# Patient Record
Sex: Male | Born: 1960 | Race: White | Hispanic: No | Marital: Single | Smoking: Former smoker
Health system: Southern US, Community
[De-identification: ages and names within clinical notes are randomized; demographics above are authoritative.]

## PROBLEM LIST (undated history)

## (undated) DIAGNOSIS — N529 Male erectile dysfunction, unspecified: Secondary | ICD-10-CM

## (undated) DIAGNOSIS — J309 Allergic rhinitis, unspecified: Secondary | ICD-10-CM

## (undated) DIAGNOSIS — R51 Headache: Secondary | ICD-10-CM

## (undated) DIAGNOSIS — R011 Cardiac murmur, unspecified: Secondary | ICD-10-CM

## (undated) DIAGNOSIS — I519 Heart disease, unspecified: Secondary | ICD-10-CM

## (undated) DIAGNOSIS — J449 Chronic obstructive pulmonary disease, unspecified: Secondary | ICD-10-CM

## (undated) DIAGNOSIS — T7840XA Allergy, unspecified, initial encounter: Secondary | ICD-10-CM

## (undated) DIAGNOSIS — L709 Acne, unspecified: Secondary | ICD-10-CM

## (undated) DIAGNOSIS — F319 Bipolar disorder, unspecified: Secondary | ICD-10-CM

## (undated) DIAGNOSIS — R911 Solitary pulmonary nodule: Secondary | ICD-10-CM

## (undated) DIAGNOSIS — R519 Headache, unspecified: Secondary | ICD-10-CM

## (undated) DIAGNOSIS — E119 Type 2 diabetes mellitus without complications: Secondary | ICD-10-CM

## (undated) DIAGNOSIS — E039 Hypothyroidism, unspecified: Secondary | ICD-10-CM

## (undated) DIAGNOSIS — E785 Hyperlipidemia, unspecified: Secondary | ICD-10-CM

## (undated) HISTORY — DX: Chronic obstructive pulmonary disease, unspecified: J44.9

## (undated) HISTORY — DX: Allergic rhinitis, unspecified: J30.9

## (undated) HISTORY — DX: Heart disease, unspecified: I51.9

## (undated) HISTORY — DX: Hypothyroidism, unspecified: E03.9

## (undated) HISTORY — PX: WISDOM TOOTH EXTRACTION: SHX21

## (undated) HISTORY — DX: Acne, unspecified: L70.9

## (undated) HISTORY — DX: Allergy, unspecified, initial encounter: T78.40XA

## (undated) HISTORY — DX: Headache, unspecified: R51.9

## (undated) HISTORY — DX: Male erectile dysfunction, unspecified: N52.9

## (undated) HISTORY — DX: Headache: R51

## (undated) HISTORY — DX: Type 2 diabetes mellitus without complications: E11.9

## (undated) HISTORY — DX: Hyperlipidemia, unspecified: E78.5

## (undated) HISTORY — DX: Cardiac murmur, unspecified: R01.1

## (undated) HISTORY — DX: Solitary pulmonary nodule: R91.1

## (undated) HISTORY — DX: Bipolar disorder, unspecified: F31.9

---

## 2015-07-06 ENCOUNTER — Ambulatory Visit: Payer: Self-pay | Admitting: Primary Care

## 2015-08-06 DIAGNOSIS — E118 Type 2 diabetes mellitus with unspecified complications: Secondary | ICD-10-CM | POA: Diagnosis not present

## 2015-08-06 DIAGNOSIS — L709 Acne, unspecified: Secondary | ICD-10-CM | POA: Diagnosis not present

## 2015-08-06 DIAGNOSIS — E1165 Type 2 diabetes mellitus with hyperglycemia: Secondary | ICD-10-CM | POA: Diagnosis not present

## 2015-08-06 DIAGNOSIS — F319 Bipolar disorder, unspecified: Secondary | ICD-10-CM | POA: Diagnosis not present

## 2015-08-06 DIAGNOSIS — E1169 Type 2 diabetes mellitus with other specified complication: Secondary | ICD-10-CM | POA: Diagnosis not present

## 2015-08-23 DIAGNOSIS — E118 Type 2 diabetes mellitus with unspecified complications: Secondary | ICD-10-CM | POA: Diagnosis not present

## 2015-08-23 DIAGNOSIS — F319 Bipolar disorder, unspecified: Secondary | ICD-10-CM | POA: Diagnosis not present

## 2015-08-23 DIAGNOSIS — E1169 Type 2 diabetes mellitus with other specified complication: Secondary | ICD-10-CM | POA: Diagnosis not present

## 2015-08-23 DIAGNOSIS — L709 Acne, unspecified: Secondary | ICD-10-CM | POA: Diagnosis not present

## 2015-08-27 DIAGNOSIS — H524 Presbyopia: Secondary | ICD-10-CM | POA: Diagnosis not present

## 2015-08-27 DIAGNOSIS — E1165 Type 2 diabetes mellitus with hyperglycemia: Secondary | ICD-10-CM | POA: Diagnosis not present

## 2015-08-27 DIAGNOSIS — E118 Type 2 diabetes mellitus with unspecified complications: Secondary | ICD-10-CM | POA: Diagnosis not present

## 2015-08-27 DIAGNOSIS — E119 Type 2 diabetes mellitus without complications: Secondary | ICD-10-CM | POA: Diagnosis not present

## 2015-08-27 DIAGNOSIS — H5213 Myopia, bilateral: Secondary | ICD-10-CM | POA: Diagnosis not present

## 2015-08-27 DIAGNOSIS — H52203 Unspecified astigmatism, bilateral: Secondary | ICD-10-CM | POA: Diagnosis not present

## 2015-09-08 DIAGNOSIS — F250 Schizoaffective disorder, bipolar type: Secondary | ICD-10-CM | POA: Diagnosis not present

## 2015-09-23 DIAGNOSIS — Z23 Encounter for immunization: Secondary | ICD-10-CM | POA: Diagnosis not present

## 2015-09-23 DIAGNOSIS — E1169 Type 2 diabetes mellitus with other specified complication: Secondary | ICD-10-CM | POA: Diagnosis not present

## 2015-09-23 DIAGNOSIS — F319 Bipolar disorder, unspecified: Secondary | ICD-10-CM | POA: Diagnosis not present

## 2015-09-23 DIAGNOSIS — E118 Type 2 diabetes mellitus with unspecified complications: Secondary | ICD-10-CM | POA: Diagnosis not present

## 2015-09-24 DIAGNOSIS — Z23 Encounter for immunization: Secondary | ICD-10-CM | POA: Diagnosis not present

## 2015-10-01 DIAGNOSIS — L03211 Cellulitis of face: Secondary | ICD-10-CM | POA: Diagnosis not present

## 2015-10-06 DIAGNOSIS — E1165 Type 2 diabetes mellitus with hyperglycemia: Secondary | ICD-10-CM | POA: Diagnosis not present

## 2015-10-06 DIAGNOSIS — E118 Type 2 diabetes mellitus with unspecified complications: Secondary | ICD-10-CM | POA: Diagnosis not present

## 2015-10-06 DIAGNOSIS — F319 Bipolar disorder, unspecified: Secondary | ICD-10-CM | POA: Diagnosis not present

## 2015-10-06 DIAGNOSIS — E1169 Type 2 diabetes mellitus with other specified complication: Secondary | ICD-10-CM | POA: Diagnosis not present

## 2015-10-06 DIAGNOSIS — Z0000 Encounter for general adult medical examination without abnormal findings: Secondary | ICD-10-CM | POA: Diagnosis not present

## 2015-10-06 DIAGNOSIS — E785 Hyperlipidemia, unspecified: Secondary | ICD-10-CM | POA: Diagnosis not present

## 2015-12-15 ENCOUNTER — Encounter: Payer: Self-pay | Admitting: Primary Care

## 2015-12-15 ENCOUNTER — Ambulatory Visit (INDEPENDENT_AMBULATORY_CARE_PROVIDER_SITE_OTHER): Payer: PPO | Admitting: Primary Care

## 2015-12-15 VITALS — BP 126/76 | HR 92 | Temp 98.6°F | Ht 76.0 in | Wt 228.4 lb

## 2015-12-15 DIAGNOSIS — E785 Hyperlipidemia, unspecified: Secondary | ICD-10-CM | POA: Diagnosis not present

## 2015-12-15 DIAGNOSIS — N529 Male erectile dysfunction, unspecified: Secondary | ICD-10-CM

## 2015-12-15 DIAGNOSIS — R5383 Other fatigue: Secondary | ICD-10-CM

## 2015-12-15 DIAGNOSIS — L700 Acne vulgaris: Secondary | ICD-10-CM | POA: Insufficient documentation

## 2015-12-15 DIAGNOSIS — F319 Bipolar disorder, unspecified: Secondary | ICD-10-CM | POA: Insufficient documentation

## 2015-12-15 DIAGNOSIS — F3174 Bipolar disorder, in full remission, most recent episode manic: Secondary | ICD-10-CM

## 2015-12-15 DIAGNOSIS — E119 Type 2 diabetes mellitus without complications: Secondary | ICD-10-CM | POA: Diagnosis not present

## 2015-12-15 DIAGNOSIS — L709 Acne, unspecified: Secondary | ICD-10-CM

## 2015-12-15 LAB — TSH: TSH: 8.59 u[IU]/mL — AB (ref 0.35–4.50)

## 2015-12-15 MED ORDER — METFORMIN HCL 1000 MG PO TABS: 1000.0000 mg | ORAL_TABLET | Freq: Two times a day (BID) | ORAL | Status: DC

## 2015-12-15 MED ORDER — SILDENAFIL CITRATE 100 MG PO TABS: 100.0000 mg | ORAL_TABLET | Freq: Every day | ORAL | Status: DC | PRN

## 2015-12-15 MED ORDER — FENOFIBRATE 150 MG PO CAPS: 150.0000 mg | ORAL_CAPSULE | Freq: Every day | ORAL | Status: DC

## 2015-12-15 MED ORDER — TRETINOIN 0.05 % EX CREA: TOPICAL_CREAM | CUTANEOUS | Status: DC | PRN

## 2015-12-15 MED ORDER — SIMVASTATIN 40 MG PO TABS: 40.0000 mg | ORAL_TABLET | Freq: Every day | ORAL | Status: DC

## 2015-12-15 MED ORDER — GLIMEPIRIDE 4 MG PO TABS: 4.0000 mg | ORAL_TABLET | Freq: Every day | ORAL | Status: DC

## 2015-12-15 NOTE — Progress Notes (Signed)
Subjective:    Patient ID: Willie Hughes, male    DOB: 06/06/1961, 55 y.o.   MRN: [id number]  HPI  Mr. Willie Hughes is a 55 year old male who presents today to establish care and discuss the problems mentioned below. Will obtain old records. His last physical was several months ago.  1) Type 2 Diabetes: Diagnosed 17 years ago. His last A1C was "high", but does not remember the exact number. Care everywhere shows an A1C of 12.5 from October 06, 2015. Currently managed on Metformin 1000 mg BID and Glimepiride 4 mg once daily which he resumed several weeks ago. Prior to 2 weeks ago he was taking his medication sporatically. He checks his blood sugars at home and gets readings of around low 200's which he believes is good. He experiences numbness/tingling to his feet.   Over the past 1 year he's felt fatigued, difficulty concentrating, depression. He would like his thyroid checked today as he is concerned he may have hypothyroidism. No family history. He is managed on Lithium.   2) Hyperlipidemia: Currently managed on Febofibrate 150 mg and Simvastatin 40 mg. His last lipid panel several months ago and he doesn't remember his levels. Care everywhere shows LDL of 93, TC of 179, Triglycerides at 537.  He endorses a fair diet: Breakfast: Orange juice, coffee with sugar, fruit, frozen biscuit, doughnuts Lunch: Sandwich, fruit, chips, chili Dinner: Frozen dinner Snacks: Fruit Desserts: Occasionally Beverages: Juice, coffee, diet and regular soda, some water  Exercise: He does not currently exercise.  3) Bipolar Disorder with Mania: Long term history of mental disorder since childhood, diagnosed with Bipolar Disorder in 2009. Currently managed on Abilify and Lithium. He has an appointment with a [facility] psychiatrist in early July. He does feel well managed at his current dose of medications. Denies SI/HI.  4) Erectile Dysfunction: Currently managed on Viagra 100 mg for the past 3 years. He will take 1 tablet  every 3 days. He is requesting a printed prescription refill today.  Review of Systems  Constitutional: Positive for fatigue.  Eyes: Negative for visual disturbance.  Respiratory: Negative for shortness of breath.   Cardiovascular: Negative for chest pain.  Endocrine: Negative for polydipsia.  Neurological: Positive for numbness.  Psychiatric/Behavioral:       See HPI       Past Medical History  Diagnosis Date  . Type 2 diabetes mellitus (HCC)   . Hyperlipidemia   . Allergic rhinitis   . Acne   . Bipolar disorder (HCC)   . Erectile dysfunction      Social History   Social History  . Marital Status: Single    Spouse Name: N/A  . Number of Children: N/A  . Years of Education: N/A   Occupational History  . Not on file.   Social History Main Topics  . Smoking status: Former Games developermoker  . Smokeless tobacco: Not on file     Comment: Viper  . Alcohol Use: No  . Drug Use: No  . Sexual Activity: Not on file   Other Topics Concern  . Not on file   Social History Narrative   Single.   None.   Disabled.    Enjoys watching TV, playing on the Internet.    No past surgical history on file.  Family History  Problem Relation Age of Onset  . Heart attack Father   . Stroke Father   . Stroke Mother   . Diabetes Mother   . Depression Mother   .  Depression Sister     No Known Allergies  No current outpatient prescriptions on file prior to visit.   No current facility-administered medications on file prior to visit.    BP 126/76 mmHg  Pulse 92  Temp(Src) 98.6 F (37 C) (Oral)  Ht  (1.93 m)  Wt 228 lb 6.4 oz (103.602 kg)  BMI 27.81 kg/m2  SpO2 94%    Objective:   Physical Exam  Constitutional: He is oriented to person, place, and time. He appears well-nourished.  Neck: Neck supple.  Cardiovascular: Normal rate and regular rhythm.   Pulmonary/Chest: Effort normal and breath sounds normal. He has no wheezes. He has no rales.  Neurological: He is alert  and oriented to person, place, and time.  Skin: Skin is warm and dry.  Psychiatric: He has a normal mood and affect.          Assessment & Plan:

## 2015-12-15 NOTE — Assessment & Plan Note (Signed)
Managed on Retin-A and OTC Lotrisone. Refill provided today.

## 2015-12-15 NOTE — Assessment & Plan Note (Signed)
Recently lipid panel with uncontrolled triglycerides from March 2017. Discussed importance of medication compliance. Managed on simvastatin, fenofibrate, and aspirin. Will recheck in 3 months

## 2015-12-15 NOTE — Assessment & Plan Note (Signed)
Managed on abilify and lithium per psychiatry. He is establishing with [facility] in July. Denies SI/HI.

## 2015-12-15 NOTE — Progress Notes (Signed)
Pre visit review using our clinic review tool, if applicable. No additional management support is needed unless otherwise documented below in the visit note. 

## 2015-12-15 NOTE — Assessment & Plan Note (Signed)
Managed on Viagra 100 mg PRN for the past 3 years. Refill provided today.

## 2015-12-15 NOTE — Assessment & Plan Note (Signed)
Uncontrolled, poor diet, does not take meds as prescribed. He recently restarted his medications 2 weeks ago. Long discussion today on the importance of medication compliance and long term effects of non compliance. Discussed proper diabetic diet. Managed on a statin, currently not on an ACE. Urine microalbumin next visit. Will recheck A1C in 3 months.

## 2015-12-15 NOTE — Patient Instructions (Signed)
I've sent refills of your medications to [facility] as requested.  It is important that you improve your diet. Please limit carbohydrates in the form of white bread, rice, pasta, cakes, cookies, sugary drinks, etc. Increase your consumption of fresh fruits and vegetables.  Ensure you are consuming 64 ounces of water daily.  Follow up in 3 months for re-evaluation of diabetes and cholesterol. Make sure you come fasting for at least 4 hours. You may have water and black coffee only.  It was a pleasure to meet you today! Please don't hesitate to call me with any questions. Welcome to [facility]!  Diabetes Mellitus and Food It is important for you to manage your blood sugar (glucose) level. Your blood glucose level can be greatly affected by what you eat. Eating healthier foods in the appropriate amounts throughout the day at about the same time each day will help you control your blood glucose level. It can also help slow or prevent worsening of your diabetes mellitus. Healthy eating may even help you improve the level of your blood pressure and reach or maintain a healthy weight.  General recommendations for healthful eating and cooking habits include:  Eating meals and snacks regularly. Avoid going long periods of time without eating to lose weight.  Eating a diet that consists mainly of plant-based foods, such as fruits, vegetables, nuts, legumes, and whole grains.  Using low-heat cooking methods, such as baking, instead of high-heat cooking methods, such as deep frying. Work with your dietitian to make sure you understand how to use the Nutrition Facts information on food labels. HOW CAN FOOD AFFECT ME? Carbohydrates Carbohydrates affect your blood glucose level more than any other type of food. Your dietitian will help you determine how many carbohydrates to eat at each meal and teach you how to count carbohydrates. Counting carbohydrates is important to keep your blood glucose at a healthy  level, especially if you are using insulin or taking certain medicines for diabetes mellitus. Alcohol Alcohol can cause sudden decreases in blood glucose (hypoglycemia), especially if you use insulin or take certain medicines for diabetes mellitus. Hypoglycemia can be a life-threatening condition. Symptoms of hypoglycemia (sleepiness, dizziness, and disorientation) are similar to symptoms of having too much alcohol.  If your health care provider has given you approval to drink alcohol, do so in moderation and use the following guidelines:  Women should not have more than one drink per day, and men should not have more than two drinks per day. One drink is equal to:  12 oz of beer.  5 oz of wine.  1 oz of hard liquor.  Do not drink on an empty stomach.  Keep yourself hydrated. Have water, diet soda, or unsweetened iced tea.  Regular soda, juice, and other mixers might contain a lot of carbohydrates and should be counted. WHAT FOODS ARE NOT RECOMMENDED? As you make food choices, it is important to remember that all foods are not the same. Some foods have fewer nutrients per serving than other foods, even though they might have the same number of calories or carbohydrates. It is difficult to get your body what it needs when you eat foods with fewer nutrients. Examples of foods that you should avoid that are high in calories and carbohydrates but low in nutrients include:  Trans fats (most processed foods list trans fats on the Nutrition Facts label).  Regular soda.  Juice.  Candy.  Sweets, such as cake, pie, doughnuts, and cookies.  Fried foods. WHAT FOODS  CAN I EAT? Eat nutrient-rich foods, which will nourish your body and keep you healthy. The food you should eat also will depend on several factors, including:  The calories you need.  The medicines you take.  Your weight.  Your blood glucose level.  Your blood pressure level.  Your cholesterol level. You should eat a  variety of foods, including:  Protein.  Lean cuts of meat.  Proteins low in saturated fats, such as fish, egg whites, and beans. Avoid processed meats.  Fruits and vegetables.  Fruits and vegetables that may help control blood glucose levels, such as apples, mangoes, and yams.  Dairy products.  Choose fat-free or low-fat dairy products, such as milk, yogurt, and cheese.  Grains, bread, pasta, and rice.  Choose whole grain products, such as multigrain bread, whole oats, and brown rice. These foods may help control blood pressure.  Fats.  Foods containing healthful fats, such as nuts, avocado, olive oil, canola oil, and fish. DOES EVERYONE WITH DIABETES MELLITUS HAVE THE SAME MEAL PLAN? Because every person with diabetes mellitus is different, there is not one meal plan that works for everyone. It is very important that you meet with a dietitian who will help you create a meal plan that is just right for you.   This information is not intended to replace advice given to you by your health care provider. Make sure you discuss any questions you have with your health care provider.   Document Released: 04/06/2005 Document Revised: 07/31/2014 Document Reviewed: 06/06/2013 Elsevier Interactive Patient Education [employer]

## 2015-12-16 ENCOUNTER — Other Ambulatory Visit: Payer: Self-pay | Admitting: Primary Care

## 2015-12-16 DIAGNOSIS — Z79899 Other long term (current) drug therapy: Secondary | ICD-10-CM

## 2015-12-17 ENCOUNTER — Other Ambulatory Visit (INDEPENDENT_AMBULATORY_CARE_PROVIDER_SITE_OTHER): Payer: PPO

## 2015-12-17 ENCOUNTER — Telehealth: Payer: Self-pay

## 2015-12-17 DIAGNOSIS — Z79899 Other long term (current) drug therapy: Secondary | ICD-10-CM | POA: Diagnosis not present

## 2015-12-17 DIAGNOSIS — E039 Hypothyroidism, unspecified: Secondary | ICD-10-CM

## 2015-12-17 MED ORDER — LEVOTHYROXINE SODIUM 25 MCG PO TABS: ORAL_TABLET | ORAL | Status: DC

## 2015-12-17 NOTE — Telephone Encounter (Signed)
Pt left v/m; pt was in office today to get lithium level and thought a rx for generic thyroid med was going to be sent to [facility]. [facility] and has [facility]. Pt request med to be sent in to pharmacy.Please advise.

## 2015-12-17 NOTE — Telephone Encounter (Signed)
Was going to wait for Lithium level to return. I did send in Levothyroxine 25 mcg tablets to his pharmacy. He is to take 1 tablet by mouth before breakfast with a full glass of water. Please have him scheduled for repeat TSH in 6 weeks. Thanks.

## 2015-12-18 LAB — LITHIUM LEVEL: Lithium Lvl: 0.3 mEq/L — ABNORMAL LOW (ref 0.80–1.40)

## 2015-12-21 NOTE — Telephone Encounter (Signed)
Spoken and notified patient of Kate's comments. Patient verbalized understanding. 

## 2016-01-19 ENCOUNTER — Encounter: Payer: Self-pay | Admitting: Primary Care

## 2016-01-27 ENCOUNTER — Encounter (HOSPITAL_COMMUNITY): Payer: Self-pay | Admitting: Psychiatry

## 2016-01-27 ENCOUNTER — Ambulatory Visit (INDEPENDENT_AMBULATORY_CARE_PROVIDER_SITE_OTHER): Payer: PPO | Admitting: Psychiatry

## 2016-01-27 VITALS — BP 140/90 | HR 111 | Ht 76.0 in | Wt 231.0 lb

## 2016-01-27 DIAGNOSIS — F4010 Social phobia, unspecified: Secondary | ICD-10-CM | POA: Diagnosis not present

## 2016-01-27 DIAGNOSIS — F3160 Bipolar disorder, current episode mixed, unspecified: Secondary | ICD-10-CM

## 2016-01-27 DIAGNOSIS — G4700 Insomnia, unspecified: Secondary | ICD-10-CM | POA: Diagnosis not present

## 2016-01-27 MED ORDER — LITHIUM CARBONATE 300 MG PO CAPS: 1200.0000 mg | ORAL_CAPSULE | Freq: Every day | ORAL | Status: DC
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
## 2016-01-28 ENCOUNTER — Encounter: Payer: Self-pay | Admitting: Primary Care

## 2016-01-28 ENCOUNTER — Ambulatory Visit (INDEPENDENT_AMBULATORY_CARE_PROVIDER_SITE_OTHER): Payer: PPO | Admitting: Primary Care

## 2016-01-28 VITALS — BP 122/76 | HR 96 | Temp 98.0°F | Ht 76.0 in | Wt 228.8 lb

## 2016-01-28 DIAGNOSIS — N529 Male erectile dysfunction, unspecified: Secondary | ICD-10-CM | POA: Diagnosis not present

## 2016-01-28 DIAGNOSIS — E039 Hypothyroidism, unspecified: Secondary | ICD-10-CM | POA: Diagnosis not present

## 2016-01-28 DIAGNOSIS — E032 Hypothyroidism due to medicaments and other exogenous substances: Secondary | ICD-10-CM

## 2016-01-28 DIAGNOSIS — Z79899 Other long term (current) drug therapy: Secondary | ICD-10-CM | POA: Diagnosis not present

## 2016-01-28 DIAGNOSIS — F3174 Bipolar disorder, in full remission, most recent episode manic: Secondary | ICD-10-CM

## 2016-01-28 MED ORDER — SILDENAFIL CITRATE 100 MG PO TABS: 100.0000 mg | ORAL_TABLET | Freq: Every day | ORAL | Status: DC | PRN

## 2016-01-28 NOTE — Progress Notes (Signed)
Pre visit review using our clinic review tool, if applicable. No additional management support is needed unless otherwise documented below in the visit note. 

## 2016-01-28 NOTE — Assessment & Plan Note (Signed)
Now following with psychiatry, enjoyed his recent visit. Will obtain lithium level today along with TSH. Discussed psychiatry will need to monitor lithium levels moving forward.

## 2016-01-28 NOTE — Progress Notes (Signed)
Subjective:    Patient ID: Willie Hughes, male    DOB: 10/23/1960, 55 y.o.   MRN: [id number]  HPI  Mr. Willie Hughes is a 55 year old male who presents today with a chief complaint of expulsion of phlegm. He noticed expelling phlegm after eating solid foods (carrots and nuts) that began 1 month ago. This was intermittent but had noticed for 3-4 days in a row several weeks ago and became worried.   This began after he switched from Allegra to Zyrtec for his seasonal allergies. He's since switched from Zyrtec back to Allegra (Wednesday last week) and has not experienced the expulsion of phlegm since.   He's reporting congestion and post nasal drip that has been present for the past 1 month. Denies nausea, fevers, abdominal pain, diarrhea, choking. He does experience occasional instances where he will have difficulty swallowing, but he does not ever experience fullness to his throat or choking.    Review of Systems  Constitutional: Negative for fever and chills.  HENT: Positive for congestion and postnasal drip. Negative for ear pain.   Respiratory: Negative for cough and shortness of breath.   Gastrointestinal: Negative for nausea, vomiting, abdominal pain and diarrhea.       Past Medical History  Diagnosis Date  . Type 2 diabetes mellitus (HCC)   . Hyperlipidemia   . Allergic rhinitis   . Acne   . Bipolar disorder (HCC)   . Erectile dysfunction   . Hypothyroidism      Social History   Social History  . Marital Status: Single    Spouse Name: N/A  . Number of Children: 0  . Years of Education: N/A   Occupational History  . Not on file.   Social History Main Topics  . Smoking status: Former Smoker    Types: E-cigarettes  . Smokeless tobacco: Never Used     Comment: Viper  . Alcohol Use: 0.0 oz/week    0 Standard drinks or equivalent per week     Comment: a few beers a week  . Drug Use: No     Comment: 1 yr ago used THC  . Sexual Activity: Not on file   Other Topics Concern  .  Not on file   Social History Narrative   Single and has one cat. Living in [place]. Never married. No kids.    Born and raised in [place] by parents. 2 sisters and 1 brother and pt is the youngest. Pt has 2 associates degrees in Sports administratorBiotech and Mechanical design   Disabled for Bipolar disorder.  Last worked 2008    Enjoys watching TV, playing on the Internet.    Past Surgical History  Procedure Laterality Date  . Wisdom tooth extraction      Family History  Problem Relation Age of Onset  . Heart attack Father   . Stroke Father   . Stroke Mother   . Diabetes Mother   . Depression Mother   . Depression Sister   . Bipolar disorder Paternal Aunt     No Known Allergies  Current Outpatient Prescriptions on File Prior to Visit  Medication Sig Dispense Refill  . ARIPiprazole (ABILIFY) 15 MG tablet Take 7.5 mg by mouth daily.     Marland Kitchen. aspirin 325 MG tablet Take 325 mg by mouth daily.    . Calcium Carbonate-Vitamin D3 (CALCIUM 600-D) 600-400 MG-UNIT TABS Take 1 tablet by mouth daily.     . Fenofibrate 150 MG CAPS Take 1 capsule (150 mg total)  by mouth daily. 90 each 1  . fexofenadine (ALLEGRA) 180 MG tablet Take 180 mg by mouth.    Marland Kitchen. glimepiride (AMARYL) 4 MG tablet Take 1 tablet (4 mg total) by mouth daily. 90 tablet 1  . levothyroxine (LEVOTHROID) 25 MCG tablet Take 1 tablet by mouth before breakfast with a full glass of water. 30 tablet 2  . lithium carbonate 300 MG capsule Take 4 capsules (1,200 mg total) by mouth daily. 120 capsule 1  . metFORMIN (GLUCOPHAGE) 1000 MG tablet Take 1 tablet (1,000 mg total) by mouth 2 (two) times daily. 180 tablet 1  . Multiple Vitamins-Minerals (CENTRUM SILVER) tablet Take 1 tablet by mouth daily.     . Omega-3 Fatty Acids (EQL FISH OIL PO) Take by mouth.    Letta Pate. ONETOUCH DELICA LANCETS 33G MISC Inject into the skin. Reported on 01/27/2016    . pseudoephedrine (SUDAFED) 120 MG 12 hr tablet     . simvastatin (ZOCOR) 40 MG tablet Take 1 tablet (40 mg total) by  mouth at bedtime. 90 tablet 1  . tretinoin (RETIN-A) 0.05 % cream Apply topically as needed. 45 g 0   No current facility-administered medications on file prior to visit.    BP 122/76 mmHg  Pulse 96  Temp(Src) 98 F (36.7 C) (Oral)  Ht 6\' 4"  (1.93 m)  Wt 228 lb 12.8 oz (103.783 kg)  BMI 27.86 kg/m2  SpO2 98%    Objective:   Physical Exam  Constitutional: He appears well-nourished.  HENT:  Right Ear: Tympanic membrane and ear canal normal.  Left Ear: Tympanic membrane and ear canal normal.  Nose: No mucosal edema. Right sinus exhibits no maxillary sinus tenderness and no frontal sinus tenderness. Left sinus exhibits no maxillary sinus tenderness and no frontal sinus tenderness.  Mouth/Throat: Oropharynx is clear and moist.  Eyes: Conjunctivae are normal.  Neck: Neck supple.  Cardiovascular: Normal rate and regular rhythm.   Pulmonary/Chest: Effort normal and breath sounds normal. He has no wheezes. He has no rales.  Abdominal: Soft. Bowel sounds are normal. There is no tenderness.  Skin: Skin is warm and dry.  Psychiatric: He has a normal mood and affect.          Assessment & Plan:  Expulsion of Phlegm secondary to Post Nasal Drip:  Intermittent 1 month ago, lasted for 3-4 days.  No symptoms since switching back from Zyrtec to Allegra. Abdominal and respiratory exam unremarkable.  Discussed to continue Allegra for PND and allergic rhinitis. Also to notify me if he develops choking, vomiting, abdominal pain, etc. Return precautions provided.  Morrie Sheldonlark,Rheanne Cortopassi Kendal, NP

## 2016-01-28 NOTE — Patient Instructions (Signed)
Complete lab work prior to leaving today. I will notify you of your results once received.   Continue Allegra. Increase consumption of water, especially when outside in the heat.   Please notify me if you develop vomiting, nausea, fevers, abdominal pain.  It was a pleasure to see you today!

## 2016-01-28 NOTE — Assessment & Plan Note (Signed)
Lithium induced. TSH of 8.5 in late May, due for recheck today. Managed on levothyroxine 25 mcg. Will adjust meds if necessary.

## 2016-01-29 LAB — LITHIUM LEVEL: LITHIUM LVL: 0.8 mmol/L (ref 0.6–1.2)

## 2016-01-29 LAB — TSH: TSH: 8.5 m[IU]/L — AB (ref 0.40–4.50)

## 2016-01-30 ENCOUNTER — Other Ambulatory Visit: Payer: Self-pay | Admitting: Primary Care

## 2016-01-30 DIAGNOSIS — E039 Hypothyroidism, unspecified: Secondary | ICD-10-CM

## 2016-01-30 MED ORDER — LEVOTHYROXINE SODIUM 50 MCG PO TABS: 50.0000 ug | ORAL_TABLET | Freq: Every day | ORAL | Status: DC

## 2016-02-05 ENCOUNTER — Other Ambulatory Visit: Payer: Self-pay | Admitting: Primary Care

## 2016-02-05 ENCOUNTER — Encounter: Payer: Self-pay | Admitting: Primary Care

## 2016-02-05 DIAGNOSIS — L709 Acne, unspecified: Secondary | ICD-10-CM

## 2016-02-07 ENCOUNTER — Encounter: Payer: Self-pay | Admitting: Primary Care

## 2016-02-07 ENCOUNTER — Other Ambulatory Visit: Payer: Self-pay | Admitting: Primary Care

## 2016-02-07 DIAGNOSIS — R5383 Other fatigue: Secondary | ICD-10-CM

## 2016-02-07 DIAGNOSIS — Z840 Family history of diseases of the skin and subcutaneous tissue: Secondary | ICD-10-CM

## 2016-02-07 NOTE — Telephone Encounter (Signed)
Electronically refill request for   tretinoin (RETIN-A) 0.05 % cream   Apply topically as needed.  Dispense: 45 g   Refills: 0     Last prescribed on 12/15/2015. Last seen on 01/28/2016. Follow up on 03/16/2016.

## 2016-02-09 ENCOUNTER — Other Ambulatory Visit (INDEPENDENT_AMBULATORY_CARE_PROVIDER_SITE_OTHER): Payer: PPO

## 2016-02-09 DIAGNOSIS — Z840 Family history of diseases of the skin and subcutaneous tissue: Secondary | ICD-10-CM

## 2016-02-09 DIAGNOSIS — R5383 Other fatigue: Secondary | ICD-10-CM | POA: Diagnosis not present

## 2016-02-09 DIAGNOSIS — Z828 Family history of other disabilities and chronic diseases leading to disablement, not elsewhere classified: Secondary | ICD-10-CM | POA: Diagnosis not present

## 2016-02-10 LAB — CBC
HEMATOCRIT: 44 % (ref 39.0–52.0)
Hemoglobin: 14.8 g/dL (ref 13.0–17.0)
MCHC: 33.5 g/dL (ref 30.0–36.0)
MCV: 88.9 fl (ref 78.0–100.0)
PLATELETS: 324 10*3/uL (ref 150.0–400.0)
RBC: 4.95 Mil/uL (ref 4.22–5.81)
RDW: 12.8 % (ref 11.5–15.5)
WBC: 8.8 10*3/uL (ref 4.0–10.5)

## 2016-02-10 LAB — BASIC METABOLIC PANEL
BUN: 11 mg/dL (ref 6–23)
CALCIUM: 10.3 mg/dL (ref 8.4–10.5)
CO2: 30 meq/L (ref 19–32)
Chloride: 100 mEq/L (ref 96–112)
Creatinine, Ser: 1.08 mg/dL (ref 0.40–1.50)
GFR: 75.53 mL/min (ref >60.00)
GLUCOSE: 315 mg/dL — AB (ref 70–99)
Potassium: 4 mEq/L (ref 3.5–5.1)
SODIUM: 136 meq/L (ref 135–145)

## 2016-02-10 LAB — SEDIMENTATION RATE: Sed Rate: 4 mm/hr (ref 0–20)

## 2016-02-10 LAB — ANA: Anti Nuclear Antibody(ANA): NEGATIVE

## 2016-03-16 ENCOUNTER — Ambulatory Visit (INDEPENDENT_AMBULATORY_CARE_PROVIDER_SITE_OTHER): Payer: PPO | Admitting: Primary Care

## 2016-03-16 VITALS — BP 138/80 | HR 105 | Temp 98.1°F | Ht 76.0 in | Wt 227.0 lb

## 2016-03-16 DIAGNOSIS — E785 Hyperlipidemia, unspecified: Secondary | ICD-10-CM | POA: Diagnosis not present

## 2016-03-16 DIAGNOSIS — E119 Type 2 diabetes mellitus without complications: Secondary | ICD-10-CM | POA: Diagnosis not present

## 2016-03-16 DIAGNOSIS — E032 Hypothyroidism due to medicaments and other exogenous substances: Secondary | ICD-10-CM

## 2016-03-16 DIAGNOSIS — M2550 Pain in unspecified joint: Secondary | ICD-10-CM | POA: Diagnosis not present

## 2016-03-16 LAB — HEMOGLOBIN A1C: Hgb A1c MFr Bld: 11.7 % — ABNORMAL HIGH (ref 4.6–6.5)

## 2016-03-16 LAB — LIPID PANEL
CHOL/HDL RATIO: 5
Cholesterol: 145 mg/dL (ref 0–200)
HDL: 27.7 mg/dL — ABNORMAL LOW (ref >39.00)

## 2016-03-16 LAB — COMPREHENSIVE METABOLIC PANEL
ALT: 28 U/L (ref 0–53)
AST: 23 U/L (ref 0–37)
Albumin: 4.5 g/dL (ref 3.5–5.2)
Alkaline Phosphatase: 89 U/L (ref 39–117)
BUN: 14 mg/dL (ref 6–23)
CO2: 28 meq/L (ref 19–32)
Calcium: 10 mg/dL (ref 8.4–10.5)
Chloride: 97 mEq/L (ref 96–112)
Creatinine, Ser: 1.12 mg/dL (ref 0.40–1.50)
GFR: 72.4 mL/min (ref >60.00)
GLUCOSE: 363 mg/dL — AB (ref 70–99)
POTASSIUM: 4.2 meq/L (ref 3.5–5.1)
SODIUM: 133 meq/L — AB (ref 135–145)
Total Bilirubin: 0.3 mg/dL (ref 0.2–1.2)
Total Protein: 7.2 g/dL (ref 6.0–8.3)

## 2016-03-16 LAB — TSH: TSH: 4.16 u[IU]/mL (ref 0.35–4.50)

## 2016-03-16 LAB — LDL CHOLESTEROL, DIRECT: Direct LDL: 73 mg/dL

## 2016-03-16 NOTE — Patient Instructions (Addendum)
Complete lab work prior to leaving today. I will notify you of your results once received.   It is important that you improve your diet. Please limit carbohydrates in the form of white bread, rice, pasta, cakes, cookies, sugary drinks, etc. Increase your consumption of fresh fruits and vegetables, whole grains, lean protein.  Ensure you are consuming 64 ounces of water daily.  You must take your medications for diabetes everyday as prescribed.  Check your blood sugars in the morning before breakfast and 2 hours after dinner. Record your readings and bring them to your next visit.  You will be contacted regarding your referral to Physical Therapy.  Please let us know if you have not heard back within one week.   Follow up in 3 months for re-evaluation.  It was a pleasure to see you today!  Diabetes Mellitus and Food It is important for you to manage your blood sugar (glucose) level. Your blood glucose level can be greatly affected by what you eat. Eating healthier foods in the appropriate amounts throughout the day at about the same time each day will help you control your blood glucose level. It can also help slow or prevent worsening of your diabetes mellitus. Healthy eating may even help you improve the level of your blood pressure and reach or maintain a healthy weight.  General recommendations for healthful eating and cooking habits include:  Eating meals and snacks regularly. Avoid going long periods of time without eating to lose weight.  Eating a diet that consists mainly of plant-based foods, such as fruits, vegetables, nuts, legumes, and whole grains.  Using low-heat cooking methods, such as baking, instead of high-heat cooking methods, such as deep frying. Work with your dietitian to make sure you understand how to use the Nutrition Facts information on food labels. HOW CAN FOOD AFFECT ME? Carbohydrates Carbohydrates affect your blood glucose level more than any other type of  food. Your dietitian will help you determine how many carbohydrates to eat at each meal and teach you how to count carbohydrates. Counting carbohydrates is important to keep your blood glucose at a healthy level, especially if you are using insulin or taking certain medicines for diabetes mellitus. Alcohol Alcohol can cause sudden decreases in blood glucose (hypoglycemia), especially if you use insulin or take certain medicines for diabetes mellitus. Hypoglycemia can be a life-threatening condition. Symptoms of hypoglycemia (sleepiness, dizziness, and disorientation) are similar to symptoms of having too much alcohol.  If your health care provider has given you approval to drink alcohol, do so in moderation and use the following guidelines:  Women should not have more than one drink per day, and men should not have more than two drinks per day. One drink is equal to:  12 oz of beer.  5 oz of wine.  1 oz of hard liquor.  Do not drink on an empty stomach.  Keep yourself hydrated. Have water, diet soda, or unsweetened iced tea.  Regular soda, juice, and other mixers might contain a lot of carbohydrates and should be counted. WHAT FOODS ARE NOT RECOMMENDED? As you make food choices, it is important to remember that all foods are not the same. Some foods have fewer nutrients per serving than other foods, even though they might have the same number of calories or carbohydrates. It is difficult to get your body what it needs when you eat foods with fewer nutrients. Examples of foods that you should avoid that are high in calories and carbohydrates but  low in nutrients include:  Trans fats (most processed foods list trans fats on the Nutrition Facts label).  Regular soda.  Juice.  Candy.  Sweets, such as cake, pie, doughnuts, and cookies.  Fried foods. WHAT FOODS CAN I EAT? Eat nutrient-rich foods, which will nourish your body and keep you healthy. The food you should eat also will depend  on several factors, including:  The calories you need.  The medicines you take.  Your weight.  Your blood glucose level.  Your blood pressure level.  Your cholesterol level. You should eat a variety of foods, including:  Protein.  Lean cuts of meat.  Proteins low in saturated fats, such as fish, egg whites, and beans. Avoid processed meats.  Fruits and vegetables.  Fruits and vegetables that may help control blood glucose levels, such as apples, mangoes, and yams.  Dairy products.  Choose fat-free or low-fat dairy products, such as milk, yogurt, and cheese.  Grains, bread, pasta, and rice.  Choose whole grain products, such as multigrain bread, whole oats, and brown rice. These foods may help control blood pressure.  Fats.  Foods containing healthful fats, such as nuts, avocado, olive oil, canola oil, and fish. DOES EVERYONE WITH DIABETES MELLITUS HAVE THE SAME MEAL PLAN? Because every person with diabetes mellitus is different, there is not one meal plan that works for everyone. It is very important that you meet with a dietitian who will help you create a meal plan that is just right for you.   This information is not intended to replace advice given to you by your health care provider. Make sure you discuss any questions you have with your health care provider.   Document Released: 04/06/2005 Document Revised: 07/31/2014 Document Reviewed: 06/06/2013 Elsevier Interactive Patient Education [employer]

## 2016-03-16 NOTE — Progress Notes (Signed)
Pre visit review using our clinic review tool, if applicable. No additional management support is needed unless otherwise documented below in the visit note. 

## 2016-03-16 NOTE — Progress Notes (Signed)
Subjective:    Patient ID: Willie Hughes, male    DOB: 12/24/1960, 55 y.o.   MRN: [id number]  HPI  Willie Hughes is a 55 year old who presents today for follow up.  1) Type 2 Diabetes: Currently managed on Metformin 1000 mg BID and Glimepiride 4 mg once daily. History of medication non compliance with last A1C of 12.5 in March 2017. He was strongly encouraged to monitor his blood sugars and remain compliant with medications.   Since his last visit he's continued to remain non compliant with his medications. He has been more compliant to his medications for the past 2-3 weeks as he thinks his blood sugars may be high. He's checking his sugars 2 hours after lunch and dinner, but does not remember the numbers. He's noticed feeling tired.   His diet currently consists of: Breakfast: Juice, fruit, yogurt, cinnamon buns Lunch: Sandwich, chips Dinner: Frozen dinners Snacks: Fruit cups, canned fruit Desserts: Occasionally Beverages: Juice, sugar free tea, water, lemonade  Exercise: He does not currently exercise.  2) Hyperlipidemia: Currently managed on Simvastatin, Fenofibrate, and Aspirin. Endorsed prior lipid panel with uncontrolled triglycerides. He has not been working on improvements in his diet and is not exercising.   3) Hypothyroidism: Diagnosed in 2017. Drug induced as he is managed on Lithium. Currently managed on Levothyroxine 50 mcg. Last TSH of 8.5 in July 2017. He is due for recheck today. Overall he's feeling improved on the 50 mcg dose. He has felt fatigued but is also not controlled with his blood sugars.  4) Arthralgias: Chronic. Bilateral knee pain, left hip pain, and left upper extremity pain for the past 6-8 months. He's been leading a sedentary lifestyle for the past 10 years as he has been unemployed. He has pain with movement, mild inflammation. Denies numbness/tingling to extremities and hip. 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 [facility] and [facility] conduction [facility] in  2015 which showed small partial thickness tears of supraspinatus and infraspinatus tendons, possible labral tear. He is requesting further evaluation through physical therapy as this was helpful in the past.  Review of Systems  Constitutional: Positive for fatigue.  Respiratory: Negative for shortness of breath.   Cardiovascular: Negative for chest pain.  Endocrine: Negative for polyuria.  Musculoskeletal: Positive for arthralgias. Negative for joint swelling.  Neurological: Negative for dizziness, weakness and numbness.  Psychiatric/Behavioral:       Follows with psychiatry, overall feeling well managed.       Past Medical History:  Diagnosis Date  . Acne   . Allergic rhinitis   . Bipolar disorder (HCC)   . Erectile dysfunction   . Hyperlipidemia   . Hypothyroidism   . Type 2 diabetes mellitus (HCC)      Social History   Social History  . Marital status: Single    Spouse name: N/A  . Number of children: 0  . Years of education: N/A   Occupational History  . Not on file.   Social History Main Topics  . Smoking status: Former Smoker    Types: E-cigarettes  . Smokeless tobacco: Never Used     Comment: Viper  . Alcohol use 0.0 oz/week     Comment: a few beers a week  . Drug use: No     Comment: 1 yr ago used THC  . Sexual activity: Not on file   Other Topics Concern  . Not on file   Social History Narrative   Single and has one cat.  Living in [place]. Never married. No kids.    Born and raised in [place] by parents. 2 sisters and 1 brother and pt is the youngest. Pt has 2 associates degrees in Sports administrator   Disabled for Bipolar disorder.  Last worked 2008    Enjoys watching TV, playing on the Internet.    Past Surgical History:  Procedure Laterality Date  . WISDOM TOOTH EXTRACTION      Family History  Problem Relation Age of Onset  . Heart attack Father   . Stroke Father   . Stroke Mother   . Diabetes Mother   . Depression Mother   .  Depression Sister   . Bipolar disorder Paternal Aunt   . Lupus Sister     No Known Allergies  Current Outpatient Prescriptions on File Prior to Visit  Medication Sig Dispense Refill  . ARIPiprazole (ABILIFY) 15 MG tablet Take 7.5 mg by mouth daily.     Marland Kitchen aspirin 325 MG tablet Take 325 mg by mouth daily.    . Calcium Carbonate-Vitamin D3 (CALCIUM 600-D) 600-400 MG-UNIT TABS Take 1 tablet by mouth daily.     . Fenofibrate 150 MG CAPS Take 1 capsule (150 mg total) by mouth daily. 90 each 1  . fexofenadine (ALLEGRA) 180 MG tablet Take 180 mg by mouth.    Marland Kitchen glimepiride (AMARYL) 4 MG tablet Take 1 tablet (4 mg total) by mouth daily. 90 tablet 1  . glucose blood (ONE TOUCH TEST STRIPS) test strip 1 each by Other route as needed for other. Use as instructed    . levothyroxine (SYNTHROID) 50 MCG tablet Take 1 tablet (50 mcg total) by mouth daily before breakfast. 30 tablet 1  . lithium carbonate 300 MG capsule Take 4 capsules (1,200 mg total) by mouth daily. 120 capsule 1  . metFORMIN (GLUCOPHAGE) 1000 MG tablet Take 1 tablet (1,000 mg total) by mouth 2 (two) times daily. 180 tablet 1  . Multiple Vitamins-Minerals (CENTRUM SILVER) tablet Take 1 tablet by mouth daily.     . Omega-3 Fatty Acids (EQL FISH OIL PO) Take by mouth.    Letta Pate DELICA LANCETS 33G MISC Inject into the skin. Reported on 01/27/2016    . pseudoephedrine (SUDAFED) 120 MG 12 hr tablet     . sildenafil (VIAGRA) 100 MG tablet Take 1 tablet (100 mg total) by mouth daily as needed for erectile dysfunction. 16 tablet 5  . simvastatin (ZOCOR) 40 MG tablet Take 1 tablet (40 mg total) by mouth at bedtime. 90 tablet 1  . tretinoin (RETIN-A) 0.05 % cream APPLY  CREAM TOPICALLY TO AFFECTED AREA AS NEEDED 45 g 5   No current facility-administered medications on file prior to visit.     BP 138/80   Pulse (!) 105   Temp 98.1 F (36.7 C) (Oral)   Ht 6\' 4"  (1.93 m)   Wt 227 lb (103 kg)   SpO2 96%   BMI 27.63 kg/m    Objective:    Physical Exam  Constitutional: He is oriented to person, place, and time. He appears well-nourished.  Neck: No thyromegaly present.  Cardiovascular: Normal rate and regular rhythm.   Pulmonary/Chest: Effort normal and breath sounds normal.  Neurological: He is alert and oriented to person, place, and time.  Skin: Skin is warm and dry.  Psychiatric: He has a normal mood and affect.          Assessment & Plan:

## 2016-03-17 ENCOUNTER — Other Ambulatory Visit: Payer: Self-pay | Admitting: Primary Care

## 2016-03-17 NOTE — Assessment & Plan Note (Signed)
Feeling much improved on levothyroxine 50 mcg. Repeat TSH pending. Will adjust accordingly if necessary. Lithium level managed per psychiatry.

## 2016-03-17 NOTE — Assessment & Plan Note (Signed)
Non compliance with medications since last visit. Long discussion regarding the importance of medication compliance, healthy diet, regular exercise. Suspect this to be the cause of his fatigue. Repeat A1C pending. Urine microalbumin pending. Managed on statin.  Foot exam next visit.

## 2016-03-17 NOTE — Assessment & Plan Note (Signed)
Managed on simvastatin 40 mg, fenofibrate, aspirin. Repeat lipids pending. Continue current regimen.

## 2016-03-21 ENCOUNTER — Encounter: Payer: Self-pay | Admitting: *Deleted

## 2016-03-21 NOTE — Telephone Encounter (Signed)
Patient responded thru Mychart.

## 2016-03-30 ENCOUNTER — Ambulatory Visit (INDEPENDENT_AMBULATORY_CARE_PROVIDER_SITE_OTHER): Payer: PPO | Admitting: Psychiatry

## 2016-03-30 ENCOUNTER — Encounter (HOSPITAL_COMMUNITY): Payer: Self-pay | Admitting: Psychiatry

## 2016-03-30 VITALS — BP 118/78 | HR 94 | Ht 76.0 in | Wt 229.0 lb

## 2016-03-30 DIAGNOSIS — F3160 Bipolar disorder, current episode mixed, unspecified: Secondary | ICD-10-CM

## 2016-03-30 DIAGNOSIS — F4010 Social phobia, unspecified: Secondary | ICD-10-CM

## 2016-03-30 DIAGNOSIS — G4700 Insomnia, unspecified: Secondary | ICD-10-CM

## 2016-03-30 MED ORDER — LITHIUM CARBONATE 300 MG PO CAPS: 1200.0000 mg | ORAL_CAPSULE | Freq: Every day | ORAL | 3 refills | Status: DC
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
## 2016-04-02 ENCOUNTER — Other Ambulatory Visit: Payer: Self-pay | Admitting: Primary Care

## 2016-04-02 DIAGNOSIS — E039 Hypothyroidism, unspecified: Secondary | ICD-10-CM

## 2016-04-03 ENCOUNTER — Encounter: Payer: Self-pay | Admitting: Physical Therapy

## 2016-04-03 ENCOUNTER — Ambulatory Visit: Payer: PPO | Attending: Primary Care | Admitting: Physical Therapy

## 2016-04-03 DIAGNOSIS — M25561 Pain in right knee: Secondary | ICD-10-CM | POA: Diagnosis not present

## 2016-04-03 DIAGNOSIS — M25562 Pain in left knee: Secondary | ICD-10-CM | POA: Insufficient documentation

## 2016-04-03 DIAGNOSIS — M25552 Pain in left hip: Secondary | ICD-10-CM | POA: Diagnosis not present

## 2016-04-03 DIAGNOSIS — M6281 Muscle weakness (generalized): Secondary | ICD-10-CM | POA: Insufficient documentation

## 2016-04-03 DIAGNOSIS — M25512 Pain in left shoulder: Secondary | ICD-10-CM | POA: Insufficient documentation
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
## 2016-04-03 NOTE — Patient Instructions (Addendum)
Flexion (Isometric)    Press right fist against wall. Hold __5__ seconds. Repeat _10___ times. Do _2___ sessions per day.  http://gt2.exer.us/113   Copyright  VHI. All rights reserved.  ROM: Flexion (Alternate)    Slide right arm up wall, with palm out, by leaning toward wall. Hold __2__ seconds. Repeat _10___ times per set. Do _1___ sets per session. Do _2___ sessions per day.  http://orth.exer.us/758   Copyright  VHI. All rights reserved.  SHOULDER: Abduction (Isometric)    Use wall as resistance. Press arm against pillow. Keep elbow straight. Hold 5___ seconds. __10_ reps per set, _1__ sets per day, _2__ days per week  Copyright  VHI. All rights reserved.  External Rotation (Isometric)    Place back of left fist against door frame, with elbow bent. Press fist against door frame. Hold _5___ seconds. Repeat _10___ times. Do __2__ sessions per day.  http://gt2.exer.us/109   Copyright  VHI. All rights reserved.  [facility] [address], [address] [place], [place] [address] Phone # [phone] Fax [phone]

## 2016-04-12 ENCOUNTER — Ambulatory Visit: Payer: PPO | Admitting: Physical Therapy

## 2016-04-19 ENCOUNTER — Encounter: Payer: Self-pay | Admitting: Physical Therapy

## 2016-04-19 ENCOUNTER — Ambulatory Visit: Payer: PPO | Admitting: Physical Therapy

## 2016-04-19 DIAGNOSIS — M25562 Pain in left knee: Secondary | ICD-10-CM

## 2016-04-19 DIAGNOSIS — M25552 Pain in left hip: Secondary | ICD-10-CM

## 2016-04-19 DIAGNOSIS — M25512 Pain in left shoulder: Secondary | ICD-10-CM

## 2016-04-19 DIAGNOSIS — M6281 Muscle weakness (generalized): Secondary | ICD-10-CM | POA: Diagnosis not present

## 2016-04-19 DIAGNOSIS — M25561 Pain in right knee: Secondary | ICD-10-CM

## 2016-04-19 NOTE — Therapy (Signed)
[facility] [address], [address] [place], [place], [address] Phone: [phone]   Fax:  [phone]  Physical Therapy Treatment  Patient Details  Name: Willie MastLarry Hughes MRN: [id number] Date of Birth: 04/21/1961 Referring Provider: Dr. Vernona RiegerKatherine Clark  Encounter Date: 04/19/2016      PT End of Session - 04/19/16 1458    Visit Number 2   Number of Visits 10   Date for PT Re-Evaluation 05/29/16   Authorization Type medicare g-code   PT Start Time 1446   PT Stop Time 1530   PT Time Calculation (min) 44 min   Activity Tolerance Patient tolerated treatment well   Behavior [facility] Therapy [facility] for tasks assessed/performed      Past Medical History:  Diagnosis Date  . Acne   . Allergic rhinitis   . Bipolar disorder (HCC)   . Erectile dysfunction   . Hyperlipidemia   . Hypothyroidism   . Type 2 diabetes mellitus (HCC)     Past Surgical History:  Procedure Laterality Date  . WISDOM TOOTH EXTRACTION      There were no vitals filed for this visit.      Subjective Assessment - 04/19/16 1450    Subjective Pt reports not having eaten a full meal today so blood sugar may be low. Pt instructed to inform therapist if not feeling well at any point. Pt reports shoulder feeling as usual today. Hip not hurting too bad, most pain is with descending stairs. Knee hurting the most today.    Pertinent History history of mental illness   Currently in Pain? Yes   Pain Score 2    Pain Location Shoulder   Pain Orientation Left   Pain Descriptors / Indicators Dull   Pain Type Chronic pain   Pain Score 3   Pain Location Hip   Pain Orientation Left   Pain Score 4   Pain Location Knee   Pain Orientation Left;Right   Pain Type Chronic pain                         [facility] Adult PT Treatment/Exercise - 04/19/16 0001      Exercises   Exercises Shoulder;Lumbar;Knee/Hip     Lumbar Exercises: Supine   Bent Knee Raise 20 reps    Straight Leg Raise 10 reps     Knee/Hip Exercises: Stretches   Active Hamstring Stretch Both;1 rep;20 seconds     Knee/Hip Exercises: Seated   Ball Squeeze 10 x 2 second holds   Clamshell with TheraBand Red     Knee/Hip Exercises: Supine   Terminal Knee Extension Strengthening;Both;2 sets;10 reps  #5     Shoulder Exercises: Supine   Horizontal ABduction Strengthening;Both;20 reps;Theraband   Other Supine Exercises Shoulder extension  red tband   Other Supine Exercises Diagonals   Tactile cueing for Lt shoulder (red tband)                  PT Short Term Goals - 04/19/16 1454      PT SHORT TERM GOAL #1   Title Independent with initial HEP   Time 4   Period Weeks   Status On-going           PT Long Term Goals - 04/19/16 1454      PT LONG TERM GOAL #1   Title independent with HEP and understands how to progress himself   Time 8   Period Weeks   Status On-going  PT LONG TERM GOAL #2   Title pain with walking in knees is minimal and understands how to manage it with flexibility exercises   Time 8   Period Weeks   Status On-going     PT LONG TERM GOAL #3   Title perform daily activities with minimal difficulty due to pain in knees and hips reduced   Time 8   Period Weeks   Status On-going     PT LONG TERM GOAL #4   Title using left arm with daily tasks with </= minmal to moderate difficulty due to increased strength and use of right arm.    Time 8   Period Weeks   Status On-going     PT LONG TERM GOAL #5   Title FOTO score </= 38% limitation   Time 8   Period Weeks   Status On-going               Plan - 04/19/16 1458    Clinical Impression Statement Pt presents with forward head posture and rounded shoulders. Able to tolerate all exercises well. Needing some tactile cues with shoulder exercises do to lack of motor control in Lt arm from nerve damage. Will continue to strengthen Bil low extremities and Bil shoulders.    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   PT Frequency 1x / week   PT Duration 8 weeks   PT Treatment/Interventions Electrical Stimulation;Cryotherapy;Therapeutic activities;Therapeutic exercise;Moist Heat;Ultrasound;Neuromuscular re-education;Patient/family education;Passive range of motion;Manual techniques   PT Next Visit Plan hip and knee stretches, AAROM to left shoulder, modalities as needed, knee extension strength, hip extension strength   Consulted and Agree with Plan of Care Patient      Patient will benefit from skilled therapeutic intervention in order to improve the following deficits and impairments:  Decreased range of motion, Increased fascial restricitons, Pain, Decreased activity tolerance, Decreased endurance, Increased muscle spasms, Decreased mobility, Decreased strength, Postural dysfunction  Visit Diagnosis: Muscle weakness (generalized)  Pain in left shoulder  Pain in left hip  Pain in right knee  Pain in left knee     Problem List Patient Active Problem List   Diagnosis Date Noted  . Hypothyroidism 01/28/2016  . Hyperlipidemia 12/15/2015  . Erectile dysfunction 12/15/2015  . Type 2 diabetes mellitus without complication, without long-term current use of insulin (HCC) 12/15/2015  . Acne 12/15/2015  . Bipolar disorder (HCC) 12/15/2015    Dessa Phi PTA 04/19/2016, 4:47 PM  [facility] [address] [place], [place], [address] Phone: [phone]   Fax:  [phone]  Name: Willie Hughes MRN: [id number] Date of Birth: 12/14/1960

## 2016-04-26 ENCOUNTER — Encounter: Payer: Self-pay | Admitting: Physical Therapy

## 2016-04-26 ENCOUNTER — Ambulatory Visit: Payer: PPO | Attending: Primary Care | Admitting: Physical Therapy

## 2016-04-26 DIAGNOSIS — M25561 Pain in right knee: Secondary | ICD-10-CM | POA: Insufficient documentation

## 2016-04-26 DIAGNOSIS — M6281 Muscle weakness (generalized): Secondary | ICD-10-CM | POA: Diagnosis not present

## 2016-04-26 DIAGNOSIS — M25512 Pain in left shoulder: Secondary | ICD-10-CM | POA: Insufficient documentation

## 2016-04-26 DIAGNOSIS — G8929 Other chronic pain: Secondary | ICD-10-CM | POA: Diagnosis not present

## 2016-04-26 DIAGNOSIS — M25562 Pain in left knee: Secondary | ICD-10-CM | POA: Insufficient documentation

## 2016-04-26 DIAGNOSIS — M25552 Pain in left hip: Secondary | ICD-10-CM | POA: Diagnosis not present

## 2016-04-26 NOTE — Therapy (Signed)
[facility] [address], [address] [place], [place], [address] Phone: [phone]   Fax:  [phone]  Physical Therapy Treatment  Patient Details  Name: Willie Hughes MRN: [id number] Date of Birth: 02-21-1961 Referring Provider: Dr. Vernona Rieger  Encounter Date: 04/26/2016      PT End of Session - 04/26/16 1452    Visit Number 3   Number of Visits 10   Date for PT Re-Evaluation 05/29/16   Authorization Type medicare g-code   PT Start Time 1448   PT Stop Time 1529   PT Time Calculation (min) 41 min   Activity Tolerance Patient tolerated treatment well   Behavior During Therapy [facility] for tasks assessed/performed      Past Medical History:  Diagnosis Date  . Acne   . Allergic rhinitis   . Bipolar disorder (HCC)   . Erectile dysfunction   . Hyperlipidemia   . Hypothyroidism   . Type 2 diabetes mellitus (HCC)     Past Surgical History:  Procedure Laterality Date  . WISDOM TOOTH EXTRACTION      There were no vitals filed for this visit.      Subjective Assessment - 04/26/16 1449    Subjective Pt reports everything feeling better today.  "Shoulder feels good as long as I don't stress it."   Pertinent History history of mental illness   Currently in Pain? Yes   Pain Score 1    Pain Location Shoulder   Pain Orientation Left   Pain Descriptors / Indicators Dull   Pain Type Chronic pain   Multiple Pain Sites Yes   Pain Score 1   Pain Location Hip   Pain Orientation Right;Left   Pain Score 1   Pain Location Knee   Pain Orientation Right;Left   Pain Type Chronic pain                         [facility] Adult PT Treatment/Exercise - 04/26/16 0001      Lumbar Exercises: Aerobic   Stationary Bike L3 6 minutes     Lumbar Exercises: Seated   Long Arc Quad on Chair Strengthening;Both;1 set;10 reps   LAQ on Chair Weights (lbs) 3     Lumbar Exercises: Supine   Bent Knee Raise 20 reps   Straight Leg  Raise 10 reps     Knee/Hip Exercises: Stretches   Active Hamstring Stretch Both;1 rep;20 seconds     Knee/Hip Exercises: Seated   Ball Squeeze 10 x 2 second holds   Clamshell with TheraBand Red     Knee/Hip Exercises: Supine   Terminal Knee Extension Strengthening;Both;2 sets;10 reps  #5     Shoulder Exercises: Supine   Horizontal ABduction Strengthening;Both;20 reps;Theraband   External Rotation Strengthening;Both;20 reps   Theraband Level (Shoulder External Rotation) Level 2 (Red)   Other Supine Exercises --   Other Supine Exercises Diagonals   Tactile cueing for Lt shoulder (red tband)                  PT Short Term Goals - 04/26/16 1527      PT SHORT TERM GOAL #1   Title Independent with initial HEP   Time 4   Period Weeks   Status On-going           PT Long Term Goals - 04/26/16 1528      PT LONG TERM GOAL #1   Title independent with HEP and understands how to  progress himself   Time 8   Period Weeks   Status On-going     PT LONG TERM GOAL #2   Title pain with walking in knees is minimal and understands how to manage it with flexibility exercises   Time 8   Period Weeks   Status On-going     PT LONG TERM GOAL #3   Title perform daily activities with minimal difficulty due to pain in knees and hips reduced   Time 8   Period Weeks   Status On-going     PT LONG TERM GOAL #4   Title using left arm with daily tasks with </= minmal to moderate difficulty due to increased strength and use of right arm.    Time 8   Period Weeks   Status On-going     PT LONG TERM GOAL #5   Title FOTO score </= 38% limitation   Time 8   Period Weeks   Status On-going               Plan - 04/26/16 1524    Clinical Impression Statement Pt reports feeling better overall. Continues to have slouched posture and needs verbal cues to sit up straight and pull shoulders back during seated exercises. Able to complete all exercises well. Needs some tactil  assistance wiith Lt shoulder during diagonals. Pt will conintue to benefit from skilled therapy for strengthening in Bil knees, hips, and shoulders. Therapist monitoring for pain and giving verbal cues for proper technique throughout treatment.    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   PT Frequency 1x / week   PT Duration 8 weeks   PT Treatment/Interventions Electrical Stimulation;Cryotherapy;Therapeutic activities;Therapeutic exercise;Moist Heat;Ultrasound;Neuromuscular re-education;Patient/family education;Passive range of motion;Manual techniques   PT Next Visit Plan hip and knee stretches, AAROM to left shoulder, modalities as needed, knee extension strength, hip extension strength   Consulted and Agree with Plan of Care Patient      Patient will benefit from skilled therapeutic intervention in order to improve the following deficits and impairments:  Decreased range of motion, Increased fascial restricitons, Pain, Decreased activity tolerance, Decreased endurance, Increased muscle spasms, Decreased mobility, Decreased strength, Postural dysfunction  Visit Diagnosis: Muscle weakness (generalized)  Pain in left hip  Left shoulder pain, unspecified chronicity     Problem List Patient Active Problem List   Diagnosis Date Noted  . Hypothyroidism 01/28/2016  . Hyperlipidemia 12/15/2015  . Erectile dysfunction 12/15/2015  . Type 2 diabetes mellitus without complication, without long-term current use of insulin (HCC) 12/15/2015  . Acne 12/15/2015  . Bipolar disorder (HCC) 12/15/2015    Dessa PhiKatherine Matthews PTA 04/26/2016, 4:26 PM  [facility] [address], [address] [place], [place], [address] Phone: [phone]   Fax:  [phone]  Name: Lelon MastLarry Lenn MRN: [id number] Date of Birth: 04/14/1961

## 2016-05-03 ENCOUNTER — Ambulatory Visit: Payer: PPO | Admitting: Physical Therapy

## 2016-05-03 ENCOUNTER — Encounter: Payer: Self-pay | Admitting: Physical Therapy

## 2016-05-03 DIAGNOSIS — M6281 Muscle weakness (generalized): Secondary | ICD-10-CM

## 2016-05-03 DIAGNOSIS — G8929 Other chronic pain: Secondary | ICD-10-CM

## 2016-05-03 DIAGNOSIS — M25512 Pain in left shoulder: Secondary | ICD-10-CM

## 2016-05-03 NOTE — Patient Instructions (Signed)
Flexion (Isometric)    Press left fist against wall. Hold __10__ seconds. Repeat _10___ times. Do _1___ sessions per day.  http://gt2.exer.us/113   Copyright  VHI. All rights reserved.  ROM: Flexion (Alternate)    Slide left arm up wall, with palm out, by leaning toward wall. Hold __1__ seconds. Repeat _10___ times per set. Do _1___ sets per session. Do _1___ sessions per day.  http://orth.exer.us/758   Copyright  VHI. All rights reserved.  SHOULDER: Abduction (Isometric)    Use wall as resistance. Press left arm against pillow. Keep elbow straight. Hold _10__ seconds. __10_ reps per set, _1__ sets per day, _1__ days per week  Copyright  VHI. All rights reserved.  External Rotation (Isometric)    Place back of left fist against door frame, with elbow bent, with no pain and very light pressure. Press fist against door frame. Hold _50___ seconds. Repeat _5_ times. Do __1__ sessions per day.  http://gt2.exer.us/109   [facility] [address], [address] [place], [place] [address] Phone # [phone] Fax [phone]

## 2016-05-03 NOTE — Therapy (Signed)
[facility] [address], [place], [place], [address] Phone: [phone]   Fax:  [phone]  Physical Therapy Treatment  Patient Details  Name: Willie Hughes MRN: [id number] Date of Birth: Sep 24, 1960 Referring Provider: Dr. Alma Friendly  Encounter Date: 05/03/2016      PT End of Session - 05/03/16 1456    Visit Number 4   Number of Visits 10   Date for PT Re-Evaluation 05/29/16   Authorization Type medicare g-code   PT Start Time 4496   PT Stop Time 1518   PT Time Calculation (min) 33 min   Activity Tolerance Patient tolerated treatment well   Behavior During Therapy [facility] for tasks assessed/performed      Past Medical History:  Diagnosis Date  . Acne   . Allergic rhinitis   . Bipolar disorder ([place])   . Erectile dysfunction   . Hyperlipidemia   . Hypothyroidism   . Type 2 diabetes mellitus ([place])     Past Surgical History:  Procedure Laterality Date  . WISDOM TOOTH EXTRACTION      There were no vitals filed for this visit.      Subjective Assessment - 05/03/16 1451    Subjective The exercises are helping my knees and thighs.  I have not had much progress with my shoulder   Pertinent History history of mental illness   Patient Stated Goals increase strength if left shoulder, reduce pain in knees and hip   Currently in Pain? Yes   Pain Score 0-No pain   Pain Location Knee  left shoulder   Pain Orientation Right;Left   Pain Descriptors / Indicators Dull   Pain Type Chronic pain   Pain Onset More than a month ago   Pain Frequency Intermittent   Aggravating Factors  lifting left arm   Pain Relieving Factors arm at side   Multiple Pain Sites No                         [facility] Adult PT Treatment/Exercise - 05/03/16 0001      Shoulder Exercises: Pulleys   Flexion 2 minutes   ABduction 2 minutes                  PT Short Term Goals - 05/03/16 1454      PT SHORT TERM  GOAL #1   Title Independent with initial HEP   Time 4   Period Weeks   Status Achieved           PT Long Term Goals - 05/03/16 1454      PT LONG TERM GOAL #1   Title independent with HEP and understands how to progress himself   Time 8   Period Weeks   Status On-going  still learning     PT LONG TERM GOAL #2   Title pain with walking in knees is minimal and understands how to manage it with flexibility exercises   Time 8   Period Weeks   Status Achieved     PT LONG TERM GOAL #3   Title perform daily activities with minimal difficulty due to pain in knees and hips reduced   Time 8   Period Weeks   Status Achieved     PT LONG TERM GOAL #4   Title using left arm with daily tasks with </= minmal to moderate difficulty due to increased strength and use of right arm.    Time 8  Period Weeks   Status On-going  no significant change yet     PT LONG TERM GOAL #5   Title FOTO score </= 38% limitation   Time 8   Period Weeks   Status On-going               Plan - 05/03/16 1457    Clinical Impression Statement Patient has met his LTG # 2 and 3. Patient reports his knees are feeling better. Patient now has minimal pain in legs now.  Patient reports no changes with his left shoulder pain.  Patient has not been doing  his shoulder exercises.  Patient needs verbal cues with HEP to not press  where there is pain. Patient will benefit from skilled therapy to progress his HEP where he is comfortable.    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   PT Frequency 1x / week   PT Duration 8 weeks   PT Treatment/Interventions Electrical Stimulation;Cryotherapy;Therapeutic activities;Therapeutic exercise;Moist Heat;Ultrasound;Neuromuscular re-education;Patient/family education;Passive range of motion;Manual techniques   PT Next Visit Plan work on HEP for LE using theraband   PT  Home Exercise Plan progress as needed   Consulted and Agree with Plan of Care Patient      Patient will benefit from skilled therapeutic intervention in order to improve the following deficits and impairments:  Decreased range of motion, Increased fascial restricitons, Pain, Decreased activity tolerance, Decreased endurance, Increased muscle spasms, Decreased mobility, Decreased strength, Postural dysfunction  Visit Diagnosis: Muscle weakness (generalized)  Left shoulder pain, unspecified chronicity  Chronic left shoulder pain     Problem List Patient Active Problem List   Diagnosis Date Noted  . Hypothyroidism 01/28/2016  . Hyperlipidemia 12/15/2015  . Erectile dysfunction 12/15/2015  . Type 2 diabetes mellitus without complication, without long-term current use of insulin ([place]) 12/15/2015  . Acne 12/15/2015  . Bipolar disorder ([place]) 12/15/2015    Willie Hughes, PT 05/03/16 3:29 PM   [facility] [address], [place], [place], [address] Phone: [phone]   Fax:  [phone]  Name: Willie Hughes MRN: [id number] Date of Birth: 12/22/1960

## 2016-05-10 ENCOUNTER — Encounter: Payer: Self-pay | Admitting: Physical Therapy

## 2016-05-10 ENCOUNTER — Ambulatory Visit: Payer: PPO | Admitting: Physical Therapy

## 2016-05-10 DIAGNOSIS — M6281 Muscle weakness (generalized): Secondary | ICD-10-CM | POA: Diagnosis not present

## 2016-05-10 DIAGNOSIS — M25562 Pain in left knee: Secondary | ICD-10-CM

## 2016-05-10 DIAGNOSIS — G8929 Other chronic pain: Secondary | ICD-10-CM

## 2016-05-10 DIAGNOSIS — M25561 Pain in right knee: Secondary | ICD-10-CM

## 2016-05-10 NOTE — Patient Instructions (Addendum)
FLEXION: Sitting - Resistance Band (Active)    Sit with right leg extended. Against yellow resistance band, bend knee and draw foot backward. Complete _2__ sets of _15__ repetitions. Perform _1__ sessions per day. Black band http://gtsc.exer.us/230   Copyright  VHI. All rights reserved.    EXTENSION: Sitting - Resistance Band (Active)    Sit with feet flat. Against yellow resistance band, straighten right knee. Complete _2__ sets of _15__ repetitions. Perform __1_ sessions per day. Blue band Copyright  VHI. All rights reserved.    Strengthening: Hip Extension - Resisted    With tubing around right ankle, face anchor and pull leg straight back. Repeat __30__ times per set. Do __1__ sets per session. Do _1___ sessions per day. Blue band http://orth.exer.us/636   Copyright  VHI. All rights reserved.  [facility] [address], [address] [place], [place] [address] Phone # [phone] Fax [phone]

## 2016-05-10 NOTE — Therapy (Addendum)
[facility] [address] [place], [place], [address] Phone: [phone]   Fax:  [phone]  Physical Therapy Treatment  Patient Details  Name: Willie Hughes MRN: [id number] Date of Birth: [birth year] Referring Provider: Dr. Alma Friendly  Encounter Date: 05/10/2016      PT End of Session - 05/10/16 1457    Visit Number 5   Date for PT Re-Evaluation 05/29/16   Authorization Type medicare g-code   PT Start Time 1448   PT Stop Time 1526   PT Time Calculation (min) 38 min   Activity Tolerance Patient tolerated treatment well   Behavior During Therapy [facility] for tasks assessed/performed      Past Medical History:  Diagnosis Date  . Acne   . Allergic rhinitis   . Bipolar disorder ([place])   . Erectile dysfunction   . Hyperlipidemia   . Hypothyroidism   . Type 2 diabetes mellitus ([place])     Past Surgical History:  Procedure Laterality Date  . WISDOM TOOTH EXTRACTION      There were no vitals filed for this visit.      Subjective Assessment - 05/10/16 1452    Subjective Shoulder only hurts when I stress my shoulders   Pertinent History history of mental illness   Patient Stated Goals increase strength if left shoulder, reduce pain in knees and hip   Currently in Pain? Yes   Pain Score 1    Pain Location Knee   Pain Orientation Right;Left   Pain Descriptors / Indicators Aching   Pain Type Chronic pain   Pain Onset More than a month ago   Pain Frequency Intermittent   Aggravating Factors  walking alot   Pain Relieving Factors rest   Multiple Pain Sites No   Pain Score 1   Pain Location Shoulder   Pain Orientation Right;Left   Pain Descriptors / Indicators Shooting   Pain Type Chronic pain   Pain Onset More than a month ago   Pain Frequency Intermittent   Aggravating Factors  lifting alot   Pain Relieving Factors rest            [facility] PT Assessment - 05/10/16 0001      AROM   Left Shoulder  Flexion 130 Degrees   Left Shoulder ABduction 90 Degrees     Strength   Right Knee Extension 4+/5   Left Knee Extension 4+/5       g-code: Functional assessment tool used is FOTO score is 53% limitation; Functional limitation is mobility: walking and moving around; Goals is CJ; Discharge is CK.  Earlie Counts, PT 06/22/16 8:26 AM                [facility] Adult PT Treatment/Exercise - 05/10/16 0001      Lumbar Exercises: Aerobic   Stationary Bike L3 6 minutes     Shoulder Exercises: Seated   Flexion AAROM;Strengthening;Left;10 reps     Shoulder Exercises: Standing   Other Standing Exercises wall push ups 10x                PT Education - 05/10/16 1524    Education provided Yes   Education Details knee and hip theraband exercises   Person(s) Educated Patient   Methods Explanation;Demonstration;Verbal cues;Handout   Comprehension Returned demonstration;Verbalized understanding          PT Short Term Goals - 05/03/16 1454      PT SHORT TERM GOAL #1   Title Independent with  initial HEP   Time 4   Period Weeks   Status Achieved           PT Long Term Goals - 05/10/16 1455      PT LONG TERM GOAL #1   Title independent with HEP and understands how to progress himself   Time 8   Period Weeks   Status On-going  still learning     PT LONG TERM GOAL #2   Title pain with walking in knees is minimal and understands how to manage it with flexibility exercises   Time 8   Period Weeks   Status Achieved     PT LONG TERM GOAL #3   Title perform daily activities with minimal difficulty due to pain in knees and hips reduced   Time 8   Period Weeks   Status Achieved     PT LONG TERM GOAL #4   Title using left arm with daily tasks with </= minmal to moderate difficulty due to increased strength and use of right arm.    Time 8   Period Weeks   Status On-going  progressing     PT LONG TERM GOAL #5   Title FOTO score </= 38% limitation   Time 8    Period Weeks   Status On-going               Plan - 05/10/16 1458    Clinical Impression Statement Patient has increased left shoulder AROM when he bends his elbow for shorter lever arm.  Patient has increased bil. knee extension to 4+/5. Patient understands how to do his HEP but reports he is not consistent with them.  Patient exercises with no increase in pain. Patient will benefit from skilled therapy to progress his HEP where he is comfortable.    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   PT Frequency 1x / week   PT Duration 8 weeks   PT Treatment/Interventions Electrical Stimulation;Cryotherapy;Therapeutic activities;Therapeutic exercise;Moist Heat;Ultrasound;Neuromuscular re-education;Patient/family education;Passive range of motion;Manual techniques   PT Next Visit Plan work on HEP for left shoulder overhead ROM   PT Home Exercise Plan progress as needed   Consulted and Agree with Plan of Care Patient      Patient will benefit from skilled therapeutic intervention in order to improve the following deficits and impairments:  Decreased range of motion, Increased fascial restricitons, Pain, Decreased activity tolerance, Decreased endurance, Increased muscle spasms, Decreased mobility, Decreased strength, Postural dysfunction  Visit Diagnosis: Muscle weakness (generalized)  Chronic pain of left knee  Chronic pain of right knee     Problem List Patient Active Problem List   Diagnosis Date Noted  . Hypothyroidism 01/28/2016  . Hyperlipidemia 12/15/2015  . Erectile dysfunction 12/15/2015  . Type 2 diabetes mellitus without complication, without long-term current use of insulin ([place]) 12/15/2015  . Acne 12/15/2015  . Bipolar disorder ([place]) 12/15/2015    Earlie Counts, PT 05/10/16 3:28 PM   [facility] [address] [place] [place], [place], [address] Phone: [phone]   Fax:  [phone]  Name: Willie Hughes MRN: [id number] Date of Birth: 10-06-1960  PHYSICAL THERAPY DISCHARGE SUMMARY  Visits from Start of Care: 5  Current functional level related to goals / functional outcomes: See above. Patient did not return since last visit on 05/10/2016.    Remaining deficits: See above.    Education /  Equipment: HEP Plan:                                                    Patient goals were not met. Patient is being discharged due to not returning since the last visit. Thank you for the referral. Earlie Counts, PT 06/22/16 8:28 AM  ?????

## 2016-05-24 ENCOUNTER — Encounter: Payer: Self-pay | Admitting: Physical Therapy

## 2016-06-19 ENCOUNTER — Ambulatory Visit (INDEPENDENT_AMBULATORY_CARE_PROVIDER_SITE_OTHER): Payer: PPO | Admitting: Primary Care

## 2016-06-19 VITALS — BP 154/98 | HR 106 | Temp 98.9°F | Ht 76.0 in | Wt 227.8 lb

## 2016-06-19 DIAGNOSIS — E032 Hypothyroidism due to medicaments and other exogenous substances: Secondary | ICD-10-CM

## 2016-06-19 DIAGNOSIS — Z23 Encounter for immunization: Secondary | ICD-10-CM

## 2016-06-19 DIAGNOSIS — E119 Type 2 diabetes mellitus without complications: Secondary | ICD-10-CM

## 2016-06-19 DIAGNOSIS — E785 Hyperlipidemia, unspecified: Secondary | ICD-10-CM | POA: Diagnosis not present

## 2016-06-19 NOTE — Progress Notes (Signed)
Pre visit review using our clinic review tool, if applicable. No additional management support is needed unless otherwise documented below in the visit note. 

## 2016-06-19 NOTE — Patient Instructions (Addendum)
Complete lab work prior to leaving today. I will notify you of your results once received.   It is important that you improve your diet. Please limit carbohydrates in the form of white bread, rice, pasta, sweets, fast food, fried food, sugary drinks, etc. Increase your consumption of fresh fruits and vegetables, whole grains, lean protein.  Ensure you are consuming 64 ounces of water daily.  Start exercising. You should be getting 150 minutes of moderate intensity exercise weekly.  I will be in touch tomorrow regarding your lab results.  It was a pleasure to see you today!  Diabetes Mellitus and Food It is important for you to manage your blood sugar (glucose) level. Your blood glucose level can be greatly affected by what you eat. Eating healthier foods in the appropriate amounts throughout the day at about the same time each day will help you control your blood glucose level. It can also help slow or prevent worsening of your diabetes mellitus. Healthy eating may even help you improve the level of your blood pressure and reach or maintain a healthy weight. General recommendations for healthful eating and cooking habits include:  Eating meals and snacks regularly. Avoid going long periods of time without eating to lose weight.  Eating a diet that consists mainly of plant-based foods, such as fruits, vegetables, nuts, legumes, and whole grains.  Using low-heat cooking methods, such as baking, instead of high-heat cooking methods, such as deep frying. Work with your dietitian to make sure you understand how to use the Nutrition Facts information on food labels. How can food affect me? Carbohydrates  Carbohydrates affect your blood glucose level more than any other type of food. Your dietitian will help you determine how many carbohydrates to eat at each meal and teach you how to count carbohydrates. Counting carbohydrates is important to keep your blood glucose at a healthy level, especially  if you are using insulin or taking certain medicines for diabetes mellitus. Alcohol  Alcohol can cause sudden decreases in blood glucose (hypoglycemia), especially if you use insulin or take certain medicines for diabetes mellitus. Hypoglycemia can be a life-threatening condition. Symptoms of hypoglycemia (sleepiness, dizziness, and disorientation) are similar to symptoms of having too much alcohol. If your health care provider has given you approval to drink alcohol, do so in moderation and use the following guidelines:  Women should not have more than one drink per day, and men should not have more than two drinks per day. One drink is equal to:  12 oz of beer.  5 oz of wine.  1 oz of hard liquor.  Do not drink on an empty stomach.  Keep yourself hydrated. Have water, diet soda, or unsweetened iced tea.  Regular soda, juice, and other mixers might contain a lot of carbohydrates and should be counted. What foods are not recommended? As you make food choices, it is important to remember that all foods are not the same. Some foods have fewer nutrients per serving than other foods, even though they might have the same number of calories or carbohydrates. It is difficult to get your body what it needs when you eat foods with fewer nutrients. Examples of foods that you should avoid that are high in calories and carbohydrates but low in nutrients include:  Trans fats (most processed foods list trans fats on the Nutrition Facts label).  Regular soda.  Juice.  Candy.  Sweets, such as cake, pie, doughnuts, and cookies.  Fried foods. What foods can I eat?  Eat nutrient-rich foods, which will nourish your body and keep you healthy. The food you should eat also will depend on several factors, including:  The calories you need.  The medicines you take.  Your weight.  Your blood glucose level.  Your blood pressure level.  Your cholesterol level. You should eat a variety of foods,  including:  Protein.  Lean cuts of meat.  Proteins low in saturated fats, such as fish, egg whites, and beans. Avoid processed meats.  Fruits and vegetables.  Fruits and vegetables that may help control blood glucose levels, such as apples, mangoes, and yams.  Dairy products.  Choose fat-free or low-fat dairy products, such as milk, yogurt, and cheese.  Grains, bread, pasta, and rice.  Choose whole grain products, such as multigrain bread, whole oats, and brown rice. These foods may help control blood pressure.  Fats.  Foods containing healthful fats, such as nuts, avocado, olive oil, canola oil, and fish. Does everyone with diabetes mellitus have the same meal plan? Because every person with diabetes mellitus is different, there is not one meal plan that works for everyone. It is very important that you meet with a dietitian who will help you create a meal plan that is just right for you. This information is not intended to replace advice given to you by your health care provider. Make sure you discuss any questions you have with your health care provider. Document Released: 04/06/2005 Document Revised: 12/16/2015 Document Reviewed: 06/06/2013 Elsevier Interactive Patient Education  2017 [employer].

## 2016-06-19 NOTE — Progress Notes (Signed)
Subjective:    Patient ID: Willie Hughes, male    DOB: 02/11/1961, 55 y.o.   MRN: [id number]  HPI  Willie Hughes is a 55 year old male who presents today for follow up.  1) Type 2 Diabetes: Uncontrolled. A1C of 11.7 in August 2017. He is currently managed on Glimepiride 8 mg once daily that was increased in August, Metformin 1000 mg BID. Since his last visit he's taking Glimepiride 4 mg as 8 mg causes drowsiness. He has not been working on improvements in his diet. He's checking his blood sugars infrequently. He was previously managed on Novolin 70/30 in the past but had to come off due to hypoglycemia.   His diet currently consists of: Breakfast: Orange juice, yogurt, canned fruit, cinnamon rolls Lunch: Sandwich, hot dogs, chips Dinner: Some pasta, some meat Snacks: Celery, carrots, salad Desserts: Occasionally Beverages: 1-2 glasses of water, diet soda.  2) Hyperlipidemia: Currently managed on Simvastatin 40 mg and fenofibrate 150 mg. He's been taking Fish Oil 2-3 times daily after meals. His lipid panel in August was with hypertriglyceridemia. He has been compliant to his medications. He is due for repeat lipids today.  3) Elevated Blood Pressure: BP at 154/98 in the office today. Prior readings stable in the office. He's taken 3 caffeine tablets and a 12 hour sudafed tablet today. He's started taking caffeine daily to prevent daily daytime drowsiness.  He denies chest pain, palpitations,   Review of Systems  Constitutional: Positive for fatigue.  Eyes: Negative for visual disturbance.  Respiratory: Negative for shortness of breath.   Cardiovascular: Negative for chest pain.  Neurological: Negative for numbness and headaches.       Past Medical History:  Diagnosis Date  . Acne   . Allergic rhinitis   . Bipolar disorder ([facility])   . Erectile dysfunction   . Hyperlipidemia   . Hypothyroidism   . Type 2 diabetes mellitus ([facility])      Social History   Social History  . Marital  status: Single    Spouse name: N/A  . Number of children: 0  . Years of education: N/A   Occupational History  . Not on file.   Social History Main Topics  . Smoking status: Former Smoker    Types: E-cigarettes  . Smokeless tobacco: Never Used     Comment: Viper  . Alcohol use 0.0 oz/week     Comment: a few beers a week  . Drug use: No     Comment: 1 yr ago used THC  . Sexual activity: Not on file   Other Topics Concern  . Not on file   Social History Narrative   Single and has one cat. Living in [place]. Never married. No kids.    Born and raised in [place] by parents. 2 sisters and 1 brother and pt is the youngest. Pt has 2 associates degrees in Sports administratorBiotech and Mechanical design   Disabled for Bipolar disorder.  Last worked 2008    Enjoys watching TV, playing on the Internet.    Past Surgical History:  Procedure Laterality Date  . WISDOM TOOTH EXTRACTION      Family History  Problem Relation Age of Onset  . Heart attack Father   . Stroke Father   . Stroke Mother   . Diabetes Mother   . Depression Mother   . Depression Sister   . Bipolar disorder Paternal Aunt   . Lupus Sister     No Known Allergies  Current Outpatient  Prescriptions on File Prior to Visit  Medication Sig Dispense Refill  . ARIPiprazole (ABILIFY) 15 MG tablet Take 7.5 mg by mouth daily.     . Ascorbic Acid (VITAMIN C) 100 MG tablet Take 100 mg by mouth daily.    Marland Kitchen. aspirin 325 MG tablet Take 325 mg by mouth daily.    . Calcium Carbonate-Vitamin D3 (CALCIUM 600-D) 600-400 MG-UNIT TABS Take 1 tablet by mouth daily.     . Fenofibrate 150 MG CAPS Take 1 capsule (150 mg total) by mouth daily. 90 each 1  . fexofenadine (ALLEGRA) 180 MG tablet Take 180 mg by mouth.    Marland Kitchen. glimepiride (AMARYL) 4 MG tablet Take 1 tablet (4 mg total) by mouth daily. 90 tablet 1  . glucose blood (ONE TOUCH TEST STRIPS) test strip 1 each by Other route as needed for other. Use as instructed    . lithium carbonate 300 MG capsule  Take 4 capsules (1,200 mg total) by mouth daily. 120 capsule 3  . metFORMIN (GLUCOPHAGE) 1000 MG tablet Take 1 tablet (1,000 mg total) by mouth 2 (two) times daily. 180 tablet 1  . Multiple Vitamins-Minerals (CENTRUM SILVER) tablet Take 1 tablet by mouth daily.     . Omega-3 Fatty Acids (EQL FISH OIL PO) Take by mouth.    Letta Pate. ONETOUCH DELICA LANCETS 33G MISC Inject into the skin. Reported on 01/27/2016    . pseudoephedrine (SUDAFED) 120 MG 12 hr tablet     . sildenafil (VIAGRA) 100 MG tablet Take 1 tablet (100 mg total) by mouth daily as needed for erectile dysfunction. 16 tablet 5  . simvastatin (ZOCOR) 40 MG tablet Take 1 tablet (40 mg total) by mouth at bedtime. 90 tablet 1  . tretinoin (RETIN-A) 0.05 % cream APPLY  CREAM TOPICALLY TO AFFECTED AREA AS NEEDED 45 g 5   No current facility-administered medications on file prior to visit.     BP (!) 154/98   Pulse (!) 106   Temp 98.9 F (37.2 C) (Oral)   Ht 6\' 4"  (1.93 m)   Wt 227 lb 12.8 oz (103.3 kg)   SpO2 98%   BMI 27.73 kg/m    Objective:   Physical Exam  Constitutional: He appears well-nourished.  Neck: Neck supple. No thyromegaly present.  Cardiovascular: Normal rate and regular rhythm.   Pulmonary/Chest: Effort normal and breath sounds normal.  Skin: Skin is warm and dry.  Psychiatric: He has a normal mood and affect.          Assessment & Plan:

## 2016-06-19 NOTE — Assessment & Plan Note (Signed)
Recheck TSH today given daytime drowsiness/fatigue.

## 2016-06-19 NOTE — Assessment & Plan Note (Signed)
Uncontrolled with A1C of 11.7 in August 2017. Not taking Glimepiride as prescribed given drowsiness. Not checking blood sugars as discussed. Will send to diabetes education and nutrition. Repeat A1C today, if above goal then will initiate insulin. [facility]

## 2016-06-19 NOTE — Assessment & Plan Note (Signed)
Triglycerides of 500 last visit. He's been compliant to medications. Repeat lipids today, if above goal then will send to cardiology for further evaluation.

## 2016-06-20 LAB — LIPID PANEL
CHOL/HDL RATIO: 4
CHOLESTEROL: 123 mg/dL (ref 0–200)
HDL: 28.1 mg/dL — ABNORMAL LOW (ref >39.00)
NonHDL: 94.97
Triglycerides: 385 mg/dL — ABNORMAL HIGH (ref 0.0–149.0)
VLDL: 77 mg/dL — AB (ref 0.0–40.0)

## 2016-06-20 LAB — BASIC METABOLIC PANEL
BUN: 10 mg/dL (ref 6–23)
CO2: 24 mEq/L (ref 19–32)
Calcium: 9.7 mg/dL (ref 8.4–10.5)
Chloride: 100 mEq/L (ref 96–112)
Creatinine, Ser: 0.95 mg/dL (ref 0.40–1.50)
GFR: 87.47 mL/min (ref >60.00)
GLUCOSE: 317 mg/dL — AB (ref 70–99)
POTASSIUM: 4.2 meq/L (ref 3.5–5.1)
Sodium: 134 mEq/L — ABNORMAL LOW (ref 135–145)

## 2016-06-20 LAB — HEMOGLOBIN A1C: Hgb A1c MFr Bld: 10.6 % — ABNORMAL HIGH (ref 4.6–6.5)

## 2016-06-20 LAB — MICROALBUMIN / CREATININE URINE RATIO
CREATININE, U: 45.7 mg/dL
Microalb Creat Ratio: 1.5 mg/g (ref 0.0–30.0)
Microalb, Ur: <0.7 mg/dL (ref 0.0–1.9)

## 2016-06-20 LAB — TSH: TSH: 3.59 u[IU]/mL (ref 0.35–4.50)

## 2016-06-20 LAB — LDL CHOLESTEROL, DIRECT: Direct LDL: 61 mg/dL

## 2016-06-22 ENCOUNTER — Encounter: Payer: Self-pay | Admitting: Primary Care

## 2016-06-24 ENCOUNTER — Other Ambulatory Visit: Payer: Self-pay | Admitting: Primary Care

## 2016-06-24 DIAGNOSIS — E119 Type 2 diabetes mellitus without complications: Secondary | ICD-10-CM

## 2016-06-24 MED ORDER — INSULIN GLARGINE 100 UNIT/ML SOLOSTAR PEN: 10.0000 [IU] | PEN_INJECTOR | Freq: Every evening | SUBCUTANEOUS | 11 refills | Status: DC

## 2016-06-24 MED ORDER — INSULIN PEN NEEDLE 31G X 6 MM MISC: 11 refills | Status: DC

## 2016-07-11 ENCOUNTER — Encounter: Payer: Self-pay | Admitting: Primary Care

## 2016-07-11 ENCOUNTER — Ambulatory Visit (INDEPENDENT_AMBULATORY_CARE_PROVIDER_SITE_OTHER): Payer: PPO | Admitting: Primary Care

## 2016-07-11 DIAGNOSIS — E119 Type 2 diabetes mellitus without complications: Secondary | ICD-10-CM

## 2016-07-11 DIAGNOSIS — L709 Acne, unspecified: Secondary | ICD-10-CM

## 2016-07-11 DIAGNOSIS — E785 Hyperlipidemia, unspecified: Secondary | ICD-10-CM

## 2016-07-11 MED ORDER — FENOFIBRATE 150 MG PO CAPS: 150.0000 mg | ORAL_CAPSULE | Freq: Every day | ORAL | 2 refills | Status: DC

## 2016-07-11 MED ORDER — METFORMIN HCL 1000 MG PO TABS: 1000.0000 mg | ORAL_TABLET | Freq: Two times a day (BID) | ORAL | 2 refills | Status: DC

## 2016-07-11 MED ORDER — SIMVASTATIN 40 MG PO TABS: 40.0000 mg | ORAL_TABLET | Freq: Every day | ORAL | 2 refills | Status: DC

## 2016-07-11 NOTE — Progress Notes (Signed)
Subjective:    Patient ID: Willie Hughes, male    DOB: 02/27/1961, 55 y.o.   MRN: [id number]  HPI  [name] is a 55 year old male who presents today with multiple complaints and for medication refills.  1) Boils: History of boils in the past. He noticed several boils to his left buttocks 1 week ago. Since he scheduled his appointment his boils have improved. He's been applying rubbing alcohol to his buttocks as this has historically worked. He denies fevers, swelling, drainage. Overall he's improved.  2) Facial Acne: History of acne since age 55. He's historically alternated treatment with Benzoyl-Peroxide and Retin-A creams. He's been using Retin-A recently with improvement. His last use was 1 week ago. He does not need a refill at this time.  3) Type 2 Diabetes: Needing refill of his Metformin. He was initiated on Lanuts 10 units HS last visit due to A1C over 11. He is compliant to his Lantus every night and has noticed improvement as he's feeling better. He's not checking his sugars as recommended. He's not working to improve his diet. He is due for re-evaluation in January 2018. He denies dizziness, weakness, numbness/tingling.  4) Hyperlipidemia: Needing refill of fenofibrate and simvastatin. Last lipid panel improved but above goal. He is compliant to his medication. He is not working on making changes in his diet.   Review of Systems  Constitutional: Negative for fatigue and fever.  Cardiovascular: Negative for chest pain.  Musculoskeletal: Negative for myalgias.  Skin: Positive for wound.       Facial acne  Neurological: Negative for dizziness and numbness.       Past Medical History:  Diagnosis Date  . Acne   . Allergic rhinitis   . Bipolar disorder ([facility])   . Erectile dysfunction   . Hyperlipidemia   . Hypothyroidism   . Type 2 diabetes mellitus ([facility])      Social History   Social History  . Marital status: Single    Spouse name: N/A  . Number of children: 0  .  Years of education: N/A   Occupational History  . Not on file.   Social History Main Topics  . Smoking status: Former Smoker    Types: E-cigarettes  . Smokeless tobacco: Never Used     Comment: Viper  . Alcohol use 0.0 oz/week     Comment: a few beers a week  . Drug use: No     Comment: 1 yr ago used THC  . Sexual activity: Not on file   Other Topics Concern  . Not on file   Social History Narrative   Single and has one cat. Living in [place]. Never married. No kids.    Born and raised in [place] by parents. 2 sisters and 1 brother and pt is the youngest. Pt has 2 associates degrees in Sports administrator[employer]otech and Mechanical design   Disabled for Bipolar disorder.  Last worked 2008    Enjoys watching TV, playing on the Internet.    Past Surgical History:  Procedure Laterality Date  . WISDOM TOOTH EXTRACTION      Family History  Problem Relation Age of Onset  . Heart attack Father   . Stroke Father   . Stroke Mother   . Diabetes Mother   . Depression Mother   . Depression Sister   . Bipolar disorder Paternal Aunt   . Lupus Sister     No Known Allergies  Current Outpatient Prescriptions on File Prior to Visit  Medication Sig Dispense Refill  . ARIPiprazole (ABILIFY) 15 MG tablet Take 7.5 mg by mouth daily.     . Ascorbic Acid (VITAMIN C) 100 MG tablet Take 100 mg by mouth daily.    Marland Kitchen. aspirin 325 MG tablet Take 325 mg by mouth daily.    . Calcium Carbonate-Vitamin D3 (CALCIUM 600-D) 600-400 MG-UNIT TABS Take 1 tablet by mouth daily.     . fexofenadine (ALLEGRA) 180 MG tablet Take 180 mg by mouth.    Marland Kitchen. glimepiride (AMARYL) 4 MG tablet Take 1 tablet (4 mg total) by mouth daily. 90 tablet 1  . glucose blood (ONE TOUCH TEST STRIPS) test strip 1 each by Other route as needed for other. Use as instructed    . Insulin Glargine (LANTUS) 100 UNIT/ML Solostar Pen Inject 10 Units into the skin every evening. 15 mL 11  . Insulin Pen Needle (COMFORT EZ PEN NEEDLES) 31G X 6 MM MISC Use with Lantus  Insulin once every evening. 100 each 11  . lithium carbonate 300 MG capsule Take 4 capsules (1,200 mg total) by mouth daily. 120 capsule 3  . Multiple Vitamins-Minerals (CENTRUM SILVER) tablet Take 1 tablet by mouth daily.     . Omega-3 Fatty Acids (EQL FISH OIL PO) Take by mouth.    Letta Pate. ONETOUCH DELICA LANCETS 33G MISC Inject into the skin. Reported on 01/27/2016    . pseudoephedrine (SUDAFED) 120 MG 12 hr tablet     . sildenafil (VIAGRA) 100 MG tablet Take 1 tablet (100 mg total) by mouth daily as needed for erectile dysfunction. 16 tablet 5  . tretinoin (RETIN-A) 0.05 % cream APPLY  CREAM TOPICALLY TO AFFECTED AREA AS NEEDED 45 g 5   No current facility-administered medications on file prior to visit.     BP 130/76   Pulse 75   Temp 98.2 F (36.8 C) (Oral)   Ht 6\' 4"  (1.93 m)   Wt 226 lb 6.4 oz (102.7 kg)   SpO2 95%   BMI 27.56 kg/m    Objective:   Physical Exam  Constitutional: He appears well-nourished.  Neck: Neck supple.  Cardiovascular: Normal rate and regular rhythm.   Pulmonary/Chest: Effort normal and breath sounds normal.  Skin: Skin is warm and dry.  Mild redness to face with mild facial acne that appears to be healing well. 2 well healing sores to left buttocks. No drainage, s/s of acute infection.          Assessment & Plan:  Skin Sores:  Noted to left buttocks x 1 week. Overall healing well after application of rubbing alcohol. Exam today with well healing sores that do not appear infectious. Discussed to refrain from use of rubbing alcohol, try warm compresses. Follow up PRN.  Morrie Sheldonlark,Valencia Kassa Kendal, NP

## 2016-07-11 NOTE — Progress Notes (Signed)
Pre visit review using our clinic review tool, if applicable. No additional management support is needed unless otherwise documented below in the visit note. 

## 2016-07-11 NOTE — Assessment & Plan Note (Signed)
Doing well on Retin-A. Declines refills today. Will continue to monitor.

## 2016-07-11 NOTE — Assessment & Plan Note (Signed)
Refill provided for simvastatin and fenofibrate.

## 2016-07-11 NOTE — Assessment & Plan Note (Signed)
Compliant to Lantus and other meds, not checking sugars. Stressed importance of checking sugars and to bring logs with him to his follow up appointment in January 2018.

## 2016-07-11 NOTE — Patient Instructions (Signed)
I sent refills of Metformin, Simvastatin, and Fenofibrate to your pharmacy.  Start checking your blood sugars three times daily (fasting in the morning, before lunch, and before bedtime).  Bring your sugar logs with you to your upcoming appointment in January.  Continue Lantus 10 units every night at bedtime.  It was a pleasure to see you today!

## 2016-08-03 ENCOUNTER — Ambulatory Visit (INDEPENDENT_AMBULATORY_CARE_PROVIDER_SITE_OTHER): Payer: PPO | Admitting: Psychiatry

## 2016-08-03 ENCOUNTER — Encounter (HOSPITAL_COMMUNITY): Payer: Self-pay | Admitting: Psychiatry

## 2016-08-03 VITALS — BP 126/74 | HR 90 | Ht 76.0 in | Wt 223.4 lb

## 2016-08-03 DIAGNOSIS — Z79899 Other long term (current) drug therapy: Secondary | ICD-10-CM

## 2016-08-03 DIAGNOSIS — Z818 Family history of other mental and behavioral disorders: Secondary | ICD-10-CM

## 2016-08-03 DIAGNOSIS — F4010 Social phobia, unspecified: Secondary | ICD-10-CM | POA: Diagnosis not present

## 2016-08-03 DIAGNOSIS — Z87891 Personal history of nicotine dependence: Secondary | ICD-10-CM

## 2016-08-03 DIAGNOSIS — F3160 Bipolar disorder, current episode mixed, unspecified: Secondary | ICD-10-CM

## 2016-08-03 DIAGNOSIS — Z823 Family history of stroke: Secondary | ICD-10-CM

## 2016-08-03 DIAGNOSIS — Z8249 Family history of ischemic heart disease and other diseases of the circulatory system: Secondary | ICD-10-CM | POA: Diagnosis not present

## 2016-08-03 DIAGNOSIS — Z7982 Long term (current) use of aspirin: Secondary | ICD-10-CM

## 2016-08-03 DIAGNOSIS — Z833 Family history of diabetes mellitus: Secondary | ICD-10-CM

## 2016-08-03 MED ORDER — LITHIUM CARBONATE 300 MG PO CAPS: 1200.0000 mg | ORAL_CAPSULE | Freq: Every day | ORAL | 3 refills | Status: DC

## 2016-08-03 MED ORDER — ARIPIPRAZOLE 15 MG PO TABS: 7.5000 mg | ORAL_TABLET | Freq: Every day | ORAL | 3 refills | Status: DC
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
## 2016-08-06 ENCOUNTER — Encounter: Payer: Self-pay | Admitting: Primary Care

## 2016-08-07 ENCOUNTER — Other Ambulatory Visit: Payer: Self-pay

## 2016-08-07 ENCOUNTER — Encounter: Payer: Self-pay | Admitting: Primary Care

## 2016-08-07 ENCOUNTER — Ambulatory Visit (INDEPENDENT_AMBULATORY_CARE_PROVIDER_SITE_OTHER): Payer: PPO | Admitting: Primary Care

## 2016-08-07 VITALS — BP 146/90 | HR 101 | Temp 98.5°F | Ht 76.0 in | Wt 230.0 lb

## 2016-08-07 DIAGNOSIS — Z1211 Encounter for screening for malignant neoplasm of colon: Secondary | ICD-10-CM

## 2016-08-07 DIAGNOSIS — E119 Type 2 diabetes mellitus without complications: Secondary | ICD-10-CM

## 2016-08-07 DIAGNOSIS — Z79899 Other long term (current) drug therapy: Secondary | ICD-10-CM | POA: Diagnosis not present

## 2016-08-07 NOTE — Patient Instructions (Addendum)
You will be contacted regarding your referral to GI for the colonscopy.  Please let us know if you have not heard back within one week.   You must start checking your blood sugars. Check your sugars every morning fasting, 2 hours after lunch, and just before bedtime.  Re-start your Lantus insulin. Inject 10 units into the skin once daily.  Follow up in 1 month for re-evaluation of diabetes.  It was a pleasure to see you today!

## 2016-08-07 NOTE — Progress Notes (Signed)
Pre visit review using our clinic review tool, if applicable. No additional management support is needed unless otherwise documented below in the visit note. 

## 2016-08-07 NOTE — Assessment & Plan Note (Signed)
Non compliant to insulin over the last 2 weeks. Long conversation today regarding importance of compliance and the repercussions that may follow. Suspect fatigue secondary to long history of uncontrolled hyperglycemia. He will restart his Lantus and take every AM. Also stressed the importance of check sugars TID, and improve diet. Discussed that if he can't get this together then will need to send him to endocrinology for further evaluation.  Foot exam today stable.

## 2016-08-07 NOTE — Progress Notes (Signed)
Subjective:    Patient ID: Willie Hughes, male    DOB: 02/28/1961, 56 y.o.   MRN: [id number]  HPI  Mr. Willie Hughes is a 56 year old male who presents today for follow up.  1) Type 2 Diabetes: Currently managed on glimepiride 4 mg, Metformin 1000 mg BID, and Lantus 10 units HS. Lantus was initiated several months ago. His last A1C was 10.6 in November 2017. Over the past several years he's been experiencing lethargy/fatigue. His A1C has been uncontrolled for years. He stopped his insulin 2 weeks ago as he thinks it's causing fatigue. He's still not checking his sugars as recommended. He endorses a poor diet and does not have time to see the nutritionist as recommended.  His diet currently consists of: Breakfast: Orange juice, yogurt, fruit, cinnamon rolls Lunch: Sandwich, chips Dinner: Pasta, fried foods, occasionally fast food. Some veggies. Snacks: Occasionally, canned fruit, pudding Desserts: Several times weekly Beverages: Water, diet soda, diet tea  2) Colon Cancer Screening: Never completed before as he doesn't have anyone to drive him home. He denies rectal bleeding, family history of colon cancer. He is interested in completing this now.   Review of Systems  Constitutional: Positive for fatigue.  Eyes: Negative for visual disturbance.  Respiratory: Negative for shortness of breath.   Cardiovascular: Negative for chest pain.  Gastrointestinal: Negative for blood in stool.  Neurological: Positive for weakness. Negative for dizziness and numbness.       Past Medical History:  Diagnosis Date  . Acne   . Allergic rhinitis   . Bipolar disorder (HCC)   . Erectile dysfunction   . Hyperlipidemia   . Hypothyroidism   . Type 2 diabetes mellitus (HCC)      Social History   Social History  . Marital status: Single    Spouse name: N/A  . Number of children: 0  . Years of education: N/A   Occupational History  . Not on file.   Social History Main Topics  . Smoking status: Former  Smoker    Types: E-cigarettes  . Smokeless tobacco: Never Used     Comment: Viper  . Alcohol use 0.0 oz/week     Comment: a few beers a week  . Drug use: No     Comment: 1 yr ago used THC  . Sexual activity: Not on file   Other Topics Concern  . Not on file   Social History Narrative   Single and has one cat. Living in [place]. Never married. No kids.    Born and raised in [place] by parents. 2 sisters and 1 brother and pt is the youngest. Pt has 2 associates degrees in Sports administratorBiotech and Mechanical design   Disabled for Bipolar disorder.  Last worked 2008    Enjoys watching TV, playing on the Internet.    Past Surgical History:  Procedure Laterality Date  . WISDOM TOOTH EXTRACTION      Family History  Problem Relation Age of Onset  . Heart attack Father   . Stroke Father   . Stroke Mother   . Diabetes Mother   . Depression Mother   . Depression Sister   . Bipolar disorder Paternal Aunt   . Lupus Sister     Allergies  Allergen Reactions  . Kava Kava     Current Outpatient Prescriptions on File Prior to Visit  Medication Sig Dispense Refill  . ARIPiprazole (ABILIFY) 15 MG tablet Take 0.5 tablets (7.5 mg total) by mouth daily. 15 tablet 3  .  Ascorbic Acid (VITAMIN C) 100 MG tablet Take 100 mg by mouth daily.    Marland Kitchen aspirin 325 MG tablet Take 325 mg by mouth daily.    . Calcium Carbonate-Vitamin D3 (CALCIUM 600-D) 600-400 MG-UNIT TABS Take 1 tablet by mouth daily.     . Fenofibrate 150 MG CAPS Take 1 capsule (150 mg total) by mouth daily. 90 each 2  . fexofenadine (ALLEGRA) 180 MG tablet Take 180 mg by mouth.    Marland Kitchen glimepiride (AMARYL) 4 MG tablet Take 1 tablet (4 mg total) by mouth daily. 90 tablet 1  . glucose blood (ONE TOUCH TEST STRIPS) test strip 1 each by Other route as needed for other. Use as instructed    . GuaiFENesin (MUCINEX PO) Take 1,200 mg by mouth 2 (two) times daily.    . Insulin Pen Needle (COMFORT EZ PEN NEEDLES) 31G X 6 MM MISC Use with Lantus Insulin once  every evening. 100 each 11  . levothyroxine (SYNTHROID, LEVOTHROID) 50 MCG tablet Take 50 mcg by mouth daily before breakfast.    . lithium carbonate 300 MG capsule Take 4 capsules (1,200 mg total) by mouth daily. 120 capsule 3  . metFORMIN (GLUCOPHAGE) 1000 MG tablet Take 1 tablet (1,000 mg total) by mouth 2 (two) times daily. 180 tablet 2  . Multiple Vitamins-Minerals (CENTRUM SILVER) tablet Take 1 tablet by mouth daily.     . Omega-3 Fatty Acids (EQL FISH OIL PO) Take by mouth.    Letta Pate DELICA LANCETS 33G MISC Inject into the skin. Reported on 01/27/2016    . pseudoephedrine (SUDAFED) 120 MG 12 hr tablet     . sildenafil (VIAGRA) 100 MG tablet Take 1 tablet (100 mg total) by mouth daily as needed for erectile dysfunction. 16 tablet 5  . simvastatin (ZOCOR) 40 MG tablet Take 1 tablet (40 mg total) by mouth at bedtime. 90 tablet 2  . tretinoin (RETIN-A) 0.05 % cream APPLY  CREAM TOPICALLY TO AFFECTED AREA AS NEEDED 45 g 5  . Insulin Glargine (LANTUS) 100 UNIT/ML Solostar Pen Inject 10 Units into the skin every evening. (Patient not taking: Reported on 08/07/2016) 15 mL 11   No current facility-administered medications on file prior to visit.     BP (!) 146/90   Pulse (!) 101   Temp 98.5 F (36.9 C) (Oral)   Ht 6\' 4"  (1.93 m)   Wt 230 lb (104.3 kg)   SpO2 98%   BMI 28.00 kg/m    Objective:   Physical Exam  Constitutional: He appears well-nourished.  Cardiovascular: Normal rate and regular rhythm.   Pulmonary/Chest: Effort normal and breath sounds normal.  Skin: Skin is warm and dry.  Psychiatric: He has a normal mood and affect.          Assessment & Plan:  Colon Cancer Screening:  Referral placed to GI. Discussed the importance of colon cancer screening, especially given medical history.  No alarm signs.  Morrie Sheldon, NP

## 2016-08-08 LAB — LITHIUM LEVEL: LITHIUM LVL: 0.6 mmol/L (ref 0.6–1.2)

## 2016-08-08 LAB — PROLACTIN: Prolactin: 5.4 ng/mL (ref 2.0–18.0)

## 2016-08-24 ENCOUNTER — Ambulatory Visit (INDEPENDENT_AMBULATORY_CARE_PROVIDER_SITE_OTHER): Payer: PPO | Admitting: Clinical

## 2016-08-24 DIAGNOSIS — F250 Schizoaffective disorder, bipolar type: Secondary | ICD-10-CM | POA: Diagnosis not present
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
## 2016-09-06 ENCOUNTER — Ambulatory Visit (INDEPENDENT_AMBULATORY_CARE_PROVIDER_SITE_OTHER): Payer: PPO | Admitting: Primary Care

## 2016-09-06 DIAGNOSIS — E119 Type 2 diabetes mellitus without complications: Secondary | ICD-10-CM | POA: Diagnosis not present

## 2016-09-06 MED ORDER — GLIMEPIRIDE 4 MG PO TABS: 4.0000 mg | ORAL_TABLET | Freq: Every day | ORAL | 3 refills | Status: DC

## 2016-09-06 NOTE — Patient Instructions (Signed)
Continue to take your glimepiride, metformin, and Lantus as discussed. Take these medications everyday.  Continue to monitor your blood sugars as discussed.  It is important that you improve your diet. Please limit carbohydrates in the form of white bread, rice, pasta, sweets, fast food, fried food, sugary drinks, etc. Increase your consumption of fresh fruits and vegetables, whole grains, lean protein.  Ensure you are consuming 64 ounces of water daily.  Start exercising. You should be getting 150 minutes of moderate intensity exercise weekly.  Follow up in 1 month for follow up.  It was a pleasure to see you today!

## 2016-09-06 NOTE — Progress Notes (Signed)
Pre visit review using our clinic review tool, if applicable. No additional management support is needed unless otherwise documented below in the visit note. 

## 2016-09-06 NOTE — Progress Notes (Signed)
Subjective:    Patient ID: Willie Hughes, male    DOB: 1960-09-12, 56 y.o.   MRN: [id number]  HPI  Willie Hughes is a 55 year old male who presents today for follow up of diabetes. Currently managed on glimepiride 4 mg, Metformin 1000 mg BID, and Lantus 10 units HS.  During his last visit he continued to remain non-compliant to his insulin and oral medications. We discussed the importance of compliance and discussed the dangers of non-compliance.   Since his last visit he endorses compliance to his medications for the past 1 week. He has noticed that he's feeling better since he's been compliant. He doesn't feel fatigued, he doesn't sleep as much. He's not checked his sugars until the past 1 week. Fasting sugars are running 130-140, highest of 175. He's not working to improving his diet as he's eating lots pasta. He's drinking sugar free lemonade and water mostly. He is not exercising.  He denies chest pain, numbness/tingling, visual changes.  Review of Systems  Eyes: Negative for visual disturbance.  Respiratory: Negative for shortness of breath.   Cardiovascular: Negative for chest pain.  Neurological: Negative for dizziness and numbness.       Past Medical History:  Diagnosis Date  . Acne   . Allergic rhinitis   . Bipolar disorder (HCC)   . Erectile dysfunction   . Hyperlipidemia   . Hypothyroidism   . Type 2 diabetes mellitus (HCC)      Social History   Social History  . Marital status: Single    Spouse name: N/A  . Number of children: 0  . Years of education: N/A   Occupational History  . Not on file.   Social History Main Topics  . Smoking status: Former Smoker    Types: E-cigarettes  . Smokeless tobacco: Never Used     Comment: Viper  . Alcohol use 0.0 oz/week     Comment: a few beers a week  . Drug use: No     Comment: 1 yr ago used THC  . Sexual activity: Not on file   Other Topics Concern  . Not on file   Social History Narrative   Single and has one  cat. Living in [place]. Never married. No kids.    Born and raised in [place] by parents. 2 sisters and 1 brother and pt is the youngest. Pt has 2 associates degrees in Sports administrator   Disabled for Bipolar disorder.  Last worked 2008    Enjoys watching TV, playing on the Internet.    Past Surgical History:  Procedure Laterality Date  . WISDOM TOOTH EXTRACTION      Family History  Problem Relation Age of Onset  . Heart attack Father   . Stroke Father   . Stroke Mother   . Diabetes Mother   . Depression Mother   . Depression Sister   . Bipolar disorder Paternal Aunt   . Lupus Sister     Allergies  Allergen Reactions  . Kava Kava     Current Outpatient Prescriptions on File Prior to Visit  Medication Sig Dispense Refill  . ARIPiprazole (ABILIFY) 15 MG tablet Take 0.5 tablets (7.5 mg total) by mouth daily. 15 tablet 3  . Ascorbic Acid (VITAMIN C) 100 MG tablet Take 100 mg by mouth daily.    Marland Kitchen aspirin 325 MG tablet Take 325 mg by mouth daily.    . Calcium Carbonate-Vitamin D3 (CALCIUM 600-D) 600-400 MG-UNIT TABS Take 1 tablet  by mouth daily.     . Fenofibrate 150 MG CAPS Take 1 capsule (150 mg total) by mouth daily. 90 each 2  . fexofenadine (ALLEGRA) 180 MG tablet Take 180 mg by mouth.    Marland Kitchen. glucose blood (ONE TOUCH TEST STRIPS) test strip 1 each by Other route as needed for other. Use as instructed    . GuaiFENesin (MUCINEX PO) Take 1,200 mg by mouth 2 (two) times daily.    . Insulin Glargine (LANTUS) 100 UNIT/ML Solostar Pen Inject 10 Units into the skin every evening. 15 mL 11  . Insulin Pen Needle (COMFORT EZ PEN NEEDLES) 31G X 6 MM MISC Use with Lantus Insulin once every evening. 100 each 11  . levothyroxine (SYNTHROID, LEVOTHROID) 50 MCG tablet Take 50 mcg by mouth daily before breakfast.    . lithium carbonate 300 MG capsule Take 4 capsules (1,200 mg total) by mouth daily. 120 capsule 3  . metFORMIN (GLUCOPHAGE) 1000 MG tablet Take 1 tablet (1,000 mg total) by  mouth 2 (two) times daily. 180 tablet 2  . Multiple Vitamins-Minerals (CENTRUM SILVER) tablet Take 1 tablet by mouth daily.     . Omega-3 Fatty Acids (EQL FISH OIL PO) Take by mouth.    Letta Pate. ONETOUCH DELICA LANCETS 33G MISC Inject into the skin. Reported on 01/27/2016    . pseudoephedrine (SUDAFED) 120 MG 12 hr tablet     . sildenafil (VIAGRA) 100 MG tablet Take 1 tablet (100 mg total) by mouth daily as needed for erectile dysfunction. 16 tablet 5  . simvastatin (ZOCOR) 40 MG tablet Take 1 tablet (40 mg total) by mouth at bedtime. 90 tablet 2  . tretinoin (RETIN-A) 0.05 % cream APPLY  CREAM TOPICALLY TO AFFECTED AREA AS NEEDED 45 g 5   No current facility-administered medications on file prior to visit.     BP 130/78   Pulse 85   Temp 98 F (36.7 C) (Oral)   Ht 6\' 4"  (1.93 m)   Wt 226 lb 1.9 oz (102.6 kg)   SpO2 97%   BMI 27.52 kg/m    Objective:   Physical Exam  Constitutional: He appears well-nourished.  Neck: Neck supple.  Cardiovascular: Normal rate and regular rhythm.   Pulmonary/Chest: Effort normal and breath sounds normal.  Skin: Skin is warm and dry.  Psychiatric: He has a normal mood and affect.          Assessment & Plan:

## 2016-09-06 NOTE — Assessment & Plan Note (Signed)
Compliance to medications for the past 1 week, does feel better since compliance to meds. Sugars much better.  Stressed the importance of compliance, he seems agreeable as he's feeling better. Will defer A1C for 1 additional month given that he's 1 week into regular intake of meds. Follow up in 1 month.

## 2016-09-18 ENCOUNTER — Encounter: Payer: Self-pay | Admitting: Primary Care

## 2016-09-20 ENCOUNTER — Encounter: Payer: Self-pay | Admitting: Primary Care

## 2016-09-20 ENCOUNTER — Ambulatory Visit (INDEPENDENT_AMBULATORY_CARE_PROVIDER_SITE_OTHER): Payer: PPO | Admitting: Primary Care

## 2016-09-20 VITALS — BP 128/80 | HR 96 | Temp 98.8°F | Ht 76.0 in | Wt 228.0 lb

## 2016-09-20 DIAGNOSIS — R51 Headache: Secondary | ICD-10-CM | POA: Diagnosis not present

## 2016-09-20 DIAGNOSIS — J309 Allergic rhinitis, unspecified: Secondary | ICD-10-CM | POA: Diagnosis not present

## 2016-09-20 DIAGNOSIS — R519 Headache, unspecified: Secondary | ICD-10-CM | POA: Insufficient documentation

## 2016-09-20 MED ORDER — MONTELUKAST SODIUM 10 MG PO TABS: 10.0000 mg | ORAL_TABLET | Freq: Every day | ORAL | 1 refills | Status: DC

## 2016-09-20 NOTE — Progress Notes (Signed)
Pre visit review using our clinic review tool, if applicable. No additional management support is needed unless otherwise documented below in the visit note. 

## 2016-09-20 NOTE — Assessment & Plan Note (Signed)
Symptoms of sore throat and voice hoarseness x 1 year. No mention of this in our prior meetings. No symptoms of viral/bacterial involvement. Could be allergy vs silent GERD. Will switch him to Singulair, continue Allegra. Stop sudafed.  If no improvement consider daily H2 Blocker. No alarm signs.

## 2016-09-20 NOTE — Patient Instructions (Addendum)
Start montelukast 10 mg (Singulair) tablets for allergies. Continue Allegra for allergies.  Stop sudafed.  Try Excedrin Migraine for headaches. This does not contain NSAID's.   Try saline nasal spray for nasal congestion/runny nose.  We will re-evaluate your symptoms during your follow up on 10/06/16.  It was a pleasure to see you today!

## 2016-09-20 NOTE — Assessment & Plan Note (Signed)
Not painful, more like "presence".  Doesn't sound like migraine, cluster headache, tension headache. Will have him stop sudafed and discussed proper use of VIagra. Start Singulair for allergies. Use Excedrin migraine PRN. Neuro exam unremarkable. Continue to monitor.

## 2016-09-20 NOTE — Progress Notes (Signed)
Subjective:    Patient ID: Willie Hughes, male    DOB: Oct 15, 1960, 56 y.o.   MRN: [id number]  HPI  Mr. Mollenkopf is a 56 year old male who presents today with a chief complaint of hoarse voice. He also reports sore throat and headaches. These symptoms have been present for the past 1-2 years.   Headaches: Yesterday the headache became worse so he took some Viagra and caffeine pills with improvement. He will noticed headaches every three days if he doesn't take his Viagra. His headache is not painful but is "a presence" which is located to the general head. He denies visual changes, nausea/vomiting. He's also taken tylenol without much improvement. He cannot take NSAID's given his Lithium prescription.   Sore Throat: His sore throat is daily. He denies esophageal burning and sour taste in his mouth. He is managed on Allegra, Sudafed, and Mucinex for allergies. He does have an occasional cough. He's been taking Allegra for years, Mucinex for 8 months, and Sudafed for years. He tried Zyrtec for several days in the recent past and quit as it didn't help. He does have a history of GERD and will take Tums several times monthly with relief.  He denies fevers, daily cough, chills, wheezing, shortness of breath.  Review of Systems  Constitutional: Negative for fever.  HENT: Positive for rhinorrhea, sore throat and voice change. Negative for congestion.   Eyes: Negative for visual disturbance.       Watery eyes  Respiratory: Negative for cough and shortness of breath.   Cardiovascular: Negative for chest pain.  Gastrointestinal: Negative for nausea.  Allergic/Immunologic: Positive for environmental allergies.  Neurological: Negative for weakness.       Past Medical History:  Diagnosis Date  . Acne   . Allergic rhinitis   . Bipolar disorder (HCC)   . Erectile dysfunction   . Hyperlipidemia   . Hypothyroidism   . Type 2 diabetes mellitus (HCC)      Social History   Social History  . Marital  status: Single    Spouse name: N/A  . Number of children: 0  . Years of education: N/A   Occupational History  . Not on file.   Social History Main Topics  . Smoking status: Former Smoker    Types: E-cigarettes  . Smokeless tobacco: Never Used     Comment: Viper  . Alcohol use 0.0 oz/week     Comment: a few beers a week  . Drug use: No     Comment: 1 yr ago used THC  . Sexual activity: Not on file   Other Topics Concern  . Not on file   Social History Narrative   Single and has one cat. Living in [place]. Never married. No kids.    Born and raised in [place] by parents. 2 sisters and 1 brother and pt is the youngest. Pt has 2 associates degrees in Sports administrator   Disabled for Bipolar disorder.  Last worked 2008    Enjoys watching TV, playing on the Internet.    Past Surgical History:  Procedure Laterality Date  . WISDOM TOOTH EXTRACTION      Family History  Problem Relation Age of Onset  . Heart attack Father   . Stroke Father   . Stroke Mother   . Diabetes Mother   . Depression Mother   . Depression Sister   . Bipolar disorder Paternal Aunt   . Lupus Sister     Allergies  Allergen Reactions  . Kava Kava     Current Outpatient Prescriptions on File Prior to Visit  Medication Sig Dispense Refill  . ARIPiprazole (ABILIFY) 15 MG tablet Take 0.5 tablets (7.5 mg total) by mouth daily. 15 tablet 3  . Ascorbic Acid (VITAMIN C) 100 MG tablet Take 100 mg by mouth daily.    Marland Kitchen. aspirin 325 MG tablet Take 325 mg by mouth daily.    . Calcium Carbonate-Vitamin D3 (CALCIUM 600-D) 600-400 MG-UNIT TABS Take 1 tablet by mouth daily.     . Fenofibrate 150 MG CAPS Take 1 capsule (150 mg total) by mouth daily. 90 each 2  . fexofenadine (ALLEGRA) 180 MG tablet Take 180 mg by mouth.    Marland Kitchen. glimepiride (AMARYL) 4 MG tablet Take 1 tablet (4 mg total) by mouth daily. 90 tablet 3  . glucose blood (ONE TOUCH TEST STRIPS) test strip 1 each by Other route as needed for other.  Use as instructed    . GuaiFENesin (MUCINEX PO) Take 1,200 mg by mouth 2 (two) times daily.    . Insulin Glargine (LANTUS) 100 UNIT/ML Solostar Pen Inject 10 Units into the skin every evening. 15 mL 11  . Insulin Pen Needle (COMFORT EZ PEN NEEDLES) 31G X 6 MM MISC Use with Lantus Insulin once every evening. 100 each 11  . levothyroxine (SYNTHROID, LEVOTHROID) 50 MCG tablet Take 50 mcg by mouth daily before breakfast.    . lithium carbonate 300 MG capsule Take 4 capsules (1,200 mg total) by mouth daily. 120 capsule 3  . metFORMIN (GLUCOPHAGE) 1000 MG tablet Take 1 tablet (1,000 mg total) by mouth 2 (two) times daily. 180 tablet 2  . Multiple Vitamins-Minerals (CENTRUM SILVER) tablet Take 1 tablet by mouth daily.     . Omega-3 Fatty Acids (EQL FISH OIL PO) Take by mouth.    Letta Pate. ONETOUCH DELICA LANCETS 33G MISC Inject into the skin. Reported on 01/27/2016    . pseudoephedrine (SUDAFED) 120 MG 12 hr tablet     . sildenafil (VIAGRA) 100 MG tablet Take 1 tablet (100 mg total) by mouth daily as needed for erectile dysfunction. 16 tablet 5  . simvastatin (ZOCOR) 40 MG tablet Take 1 tablet (40 mg total) by mouth at bedtime. 90 tablet 2  . tretinoin (RETIN-A) 0.05 % cream APPLY  CREAM TOPICALLY TO AFFECTED AREA AS NEEDED 45 g 5   No current facility-administered medications on file prior to visit.     BP 128/80   Pulse 96   Temp 98.8 F (37.1 C) (Oral)   Ht 6\' 4"  (1.93 m)   Wt 228 lb (103.4 kg)   SpO2 96%   BMI 27.75 kg/m    Objective:   Physical Exam  Constitutional: He is oriented to person, place, and time. He appears well-nourished.  HENT:  Right Ear: Tympanic membrane and ear canal normal.  Left Ear: Tympanic membrane and ear canal normal.  Nose: No mucosal edema. Right sinus exhibits no maxillary sinus tenderness and no frontal sinus tenderness. Left sinus exhibits no maxillary sinus tenderness and no frontal sinus tenderness.  Mouth/Throat: Oropharynx is clear and moist.  Eyes: EOM are  normal. Pupils are equal, round, and reactive to light.  Neck: Neck supple.  Cardiovascular: Normal rate and regular rhythm.   Pulmonary/Chest: Effort normal and breath sounds normal. He has no wheezes. He has no rales.  Neurological: He is alert and oriented to person, place, and time. No cranial nerve deficit.  Skin: Skin is warm  and dry.          Assessment & Plan:

## 2016-09-25 ENCOUNTER — Other Ambulatory Visit: Payer: Self-pay | Admitting: Primary Care

## 2016-09-25 DIAGNOSIS — E039 Hypothyroidism, unspecified: Secondary | ICD-10-CM

## 2016-09-26 ENCOUNTER — Telehealth: Payer: Self-pay | Admitting: Primary Care

## 2016-09-26 ENCOUNTER — Ambulatory Visit (INDEPENDENT_AMBULATORY_CARE_PROVIDER_SITE_OTHER): Payer: PPO | Admitting: Clinical

## 2016-09-26 DIAGNOSIS — F250 Schizoaffective disorder, bipolar type: Secondary | ICD-10-CM | POA: Diagnosis not present

## 2016-09-26 NOTE — Telephone Encounter (Signed)
LVM for pt to call back and schedule AWV + labs with Lesia and CPE with PCP. °

## 2016-09-28 ENCOUNTER — Encounter (HOSPITAL_COMMUNITY): Payer: Self-pay | Admitting: Clinical

## 2016-09-28 NOTE — Progress Notes (Signed)
   THERAPIST PROGRESS NOTE  Session Time: 3:30 -4:25  Participation Level: Active  Behavioral Response: CasualAlertDepressed  Type of Therapy: Individual Therapy  Treatment Goals addressed: improve psychiatric symptoms, elevate mood, improve unhelpful thought patterns,  learn about diagnosis, healthy coping skills  Interventions: CBT and Motivational Interviewing and grounding and mindfulness techniques, psychoeducation  Summary: Willie Hughes is a 56 y.o.. male who presents with Schizoaffective disorder, bipolar type  Suicidal/Homicidal: No - without intent or plan  Therapist Response:  Willie Hughes met with clinician for an individual session. Willie Hughes discussed his psychiatric symptoms, his current life events, and his goals for therapy. Willie Hughes shared he had been depressed. Willie Hughes shared his belief that others hate him and so he isolates. Clinician asked open ended questions about his thoughts and emotions. Client and clinician discussed his symptoms and healthy coping skills. Clinician introduced grounding and mindfulness.techniques. Clinician explained the process purpose and practice of the techniques. Client and clinician practiced some of the techniques. Gustaf agreed to practice them daily until next session. Clinician introduced some basic cbt concepts. Client and clinician discussed the thought emotion connection. Clinician gave Willie Hughes Bipolar packet 1&2 which he agreed to complete before next session.   Plan: Return again in 1-2 weeks.  Diagnosis: Axis I: Schizoaffective disorder, bipolar type    Demia Viera A, LCSW 09/28/2016

## 2016-10-04 ENCOUNTER — Other Ambulatory Visit: Payer: Self-pay | Admitting: Primary Care

## 2016-10-04 DIAGNOSIS — Z1159 Encounter for screening for other viral diseases: Secondary | ICD-10-CM

## 2016-10-04 DIAGNOSIS — E785 Hyperlipidemia, unspecified: Secondary | ICD-10-CM

## 2016-10-04 DIAGNOSIS — Z794 Long term (current) use of insulin: Principal | ICD-10-CM

## 2016-10-04 DIAGNOSIS — E118 Type 2 diabetes mellitus with unspecified complications: Secondary | ICD-10-CM

## 2016-10-06 ENCOUNTER — Ambulatory Visit (INDEPENDENT_AMBULATORY_CARE_PROVIDER_SITE_OTHER): Payer: PPO | Admitting: Primary Care

## 2016-10-06 ENCOUNTER — Encounter: Payer: Self-pay | Admitting: Primary Care

## 2016-10-06 VITALS — BP 140/94 | HR 97 | Temp 98.0°F | Ht 76.0 in | Wt 225.8 lb

## 2016-10-06 DIAGNOSIS — E118 Type 2 diabetes mellitus with unspecified complications: Secondary | ICD-10-CM | POA: Diagnosis not present

## 2016-10-06 DIAGNOSIS — J309 Allergic rhinitis, unspecified: Secondary | ICD-10-CM | POA: Diagnosis not present

## 2016-10-06 DIAGNOSIS — L03317 Cellulitis of buttock: Secondary | ICD-10-CM | POA: Diagnosis not present

## 2016-10-06 DIAGNOSIS — Z794 Long term (current) use of insulin: Secondary | ICD-10-CM

## 2016-10-06 MED ORDER — SULFAMETHOXAZOLE-TRIMETHOPRIM 800-160 MG PO TABS: 1.0000 | ORAL_TABLET | Freq: Two times a day (BID) | ORAL | 0 refills | Status: DC

## 2016-10-06 NOTE — Patient Instructions (Signed)
Come fasting to your appointment next week with Lesia.  We will complete your ECG next week and notify your psychiatrist.  Try ranitidine 75 mg tablets once daily for voice hoarseness.  Start Bactrim DS (sulfamethoxazole/trimethoprim) tablets for skin infection. Take 1 tablet by mouth twice daily for 7 days.  We will see you next week! It was a pleasure to see you today!

## 2016-10-06 NOTE — Progress Notes (Signed)
Subjective:    Patient ID: Willie Hughes, male    DOB: 12/11/1960, 56 y.o.   MRN: [id number]  HPI  Willie Hughes is a 56 year old male who presents today for follow up of type 2 diabetes.  Currently managed on metformin 1000 mg twice daily, glimepiride 4 mg, Lantus 10 units HS. He has a history of non-compliance to medications and inconsistently checking blood sugars, and during his last visit endorsed this. He is due for A1C check today.  Since his last visit he's checking his sugars inconsistently. He's checked his sugars twice fasting which have been 181, 202. His numbers before meals are upper 100's-low 200's. He is compliant to his Lantus, glimepiride, and metformin most days. Overall he's feeling better.   2) Allergic Rhinitis: Voice hoarseness, mostly intermittently, no change since last visit. He experiences drainage and mucous buildup. He does experience acid reflux that requires use of Tums 1-2 times monthly.  He never took the Singulair as prescribed during his last visit as he's afraid of potential side effects.  3) Skin Irritation: Noticed to the left buttocks for the past 4 weeks. He's noticed pain, drainage, and continued enlargement. He denies fevers. He's treated this with bleach in the past. He sits often most everyday of the week.   Review of Systems  Constitutional: Negative for fever.  HENT: Positive for congestion and postnasal drip.   Respiratory: Negative for shortness of breath.   Cardiovascular: Negative for chest pain.  Skin: Positive for color change and wound.  Neurological: Negative for numbness.  Psychiatric/Behavioral:       He's been up for the past 24 hours as he stayed up all night and had to be somewhere early this morning.        Past Medical History:  Diagnosis Date  . Acne   . Allergic rhinitis   . Bipolar disorder (HCC)   . Erectile dysfunction   . Hyperlipidemia   . Hypothyroidism   . Type 2 diabetes mellitus (HCC)      Social History    Social History  . Marital status: Single    Spouse name: N/A  . Number of children: 0  . Years of education: N/A   Occupational History  . Not on file.   Social History Main Topics  . Smoking status: Former Smoker    Types: E-cigarettes  . Smokeless tobacco: Never Used     Comment: Viper  . Alcohol use 0.0 oz/week     Comment: a few beers a week  . Drug use: No     Comment: 1 yr ago used THC  . Sexual activity: Not on file   Other Topics Concern  . Not on file   Social History Narrative   Single and has one cat. Living in [place]. Never married. No kids.    Born and raised in [place] by parents. 2 sisters and 1 brother and pt is the youngest. Pt has 2 associates degrees in Sports administratorBiotech and Mechanical design   Disabled for Bipolar disorder.  Last worked 2008    Enjoys watching TV, playing on the Internet.    Past Surgical History:  Procedure Laterality Date  . WISDOM TOOTH EXTRACTION      Family History  Problem Relation Age of Onset  . Heart attack Father   . Stroke Father   . Stroke Mother   . Diabetes Mother   . Depression Mother   . Depression Sister   . Bipolar disorder Paternal Aunt   .  Lupus Sister     Allergies  Allergen Reactions  . Kava Kava     Current Outpatient Prescriptions on File Prior to Visit  Medication Sig Dispense Refill  . ARIPiprazole (ABILIFY) 15 MG tablet Take 0.5 tablets (7.5 mg total) by mouth daily. 15 tablet 3  . Ascorbic Acid (VITAMIN C) 100 MG tablet Take 100 mg by mouth daily.    Marland Kitchen aspirin 325 MG tablet Take 325 mg by mouth daily.    . Calcium Carbonate-Vitamin D3 (CALCIUM 600-D) 600-400 MG-UNIT TABS Take 1 tablet by mouth daily.     . Fenofibrate 150 MG CAPS Take 1 capsule (150 mg total) by mouth daily. 90 each 2  . fexofenadine (ALLEGRA) 180 MG tablet Take 180 mg by mouth.    Marland Kitchen glimepiride (AMARYL) 4 MG tablet Take 1 tablet (4 mg total) by mouth daily. 90 tablet 3  . glucose blood (ONE TOUCH TEST STRIPS) test strip 1 each by  Other route as needed for other. Use as instructed    . GuaiFENesin (MUCINEX PO) Take 1,200 mg by mouth 2 (two) times daily.    . Insulin Glargine (LANTUS) 100 UNIT/ML Solostar Pen Inject 10 Units into the skin every evening. 15 mL 11  . Insulin Pen Needle (COMFORT EZ PEN NEEDLES) 31G X 6 MM MISC Use with Lantus Insulin once every evening. 100 each 11  . levothyroxine (SYNTHROID, LEVOTHROID) 50 MCG tablet TAKE ONE TABLET BY MOUTH ONCE DAILY BEFORE BREAKFAST 30 tablet 5  . lithium carbonate 300 MG capsule Take 4 capsules (1,200 mg total) by mouth daily. 120 capsule 3  . metFORMIN (GLUCOPHAGE) 1000 MG tablet Take 1 tablet (1,000 mg total) by mouth 2 (two) times daily. 180 tablet 2  . Multiple Vitamins-Minerals (CENTRUM SILVER) tablet Take 1 tablet by mouth daily.     . Omega-3 Fatty Acids (EQL FISH OIL PO) Take by mouth.    Letta Pate DELICA LANCETS 33G MISC Inject into the skin. Reported on 01/27/2016    . pseudoephedrine (SUDAFED) 120 MG 12 hr tablet     . sildenafil (VIAGRA) 100 MG tablet Take 1 tablet (100 mg total) by mouth daily as needed for erectile dysfunction. 16 tablet 5  . simvastatin (ZOCOR) 40 MG tablet Take 1 tablet (40 mg total) by mouth at bedtime. 90 tablet 2  . tretinoin (RETIN-A) 0.05 % cream APPLY  CREAM TOPICALLY TO AFFECTED AREA AS NEEDED 45 g 5  . montelukast (SINGULAIR) 10 MG tablet Take 1 tablet (10 mg total) by mouth at bedtime. (Patient not taking: Reported on 10/06/2016) 30 tablet 1   No current facility-administered medications on file prior to visit.     BP (!) 140/94   Pulse 97   Temp 98 F (36.7 C) (Oral)   Ht 6\' 4"  (1.93 m)   Wt 225 lb 12.8 oz (102.4 kg)   SpO2 98%   BMI 27.49 kg/m    Objective:   Physical Exam  Constitutional: He appears well-nourished.  Cardiovascular: Normal rate and regular rhythm.   Pulmonary/Chest: Effort normal and breath sounds normal.  Skin: Skin is warm.  1 cm circular, raised abscess to left buttocks, scabbed over.  Surrounding erythema suspicious for cellulitis. Tender.          Assessment & Plan:  Cellulitis:  Wound to left buttocks x 1 month, no improvement. Exam today consistent with abscess and surrounding cellulitis. Treat today with Bactrim DS.  Discussed home care instructions. Follow up PRN.  Morrie Sheldon, NP

## 2016-10-06 NOTE — Progress Notes (Signed)
Pre visit review using our clinic review tool, if applicable. No additional management support is needed unless otherwise documented below in the visit note. 

## 2016-10-06 NOTE — Assessment & Plan Note (Signed)
Fasting sugars seem improved, but not yet at goal. Will obtain A1C. Likely need to increase Lantus to 15 untis HS if A1C above goal. Continue oral regimen.

## 2016-10-06 NOTE — Assessment & Plan Note (Signed)
Continues with voice hoarseness. Never filled Singulair as he's afraid of potential side effects. Will have him trial low dose H2 Blocker for possible silent reflux. He will update if no improvement.

## 2016-10-09 ENCOUNTER — Ambulatory Visit: Payer: PPO

## 2016-10-11 ENCOUNTER — Encounter: Payer: Self-pay | Admitting: Primary Care

## 2016-10-11 ENCOUNTER — Ambulatory Visit (INDEPENDENT_AMBULATORY_CARE_PROVIDER_SITE_OTHER): Payer: PPO | Admitting: Primary Care

## 2016-10-11 VITALS — BP 134/82 | HR 93 | Temp 98.0°F | Ht 76.0 in | Wt 233.0 lb

## 2016-10-11 DIAGNOSIS — Z1211 Encounter for screening for malignant neoplasm of colon: Secondary | ICD-10-CM | POA: Diagnosis not present

## 2016-10-11 DIAGNOSIS — Z794 Long term (current) use of insulin: Secondary | ICD-10-CM | POA: Diagnosis not present

## 2016-10-11 DIAGNOSIS — Z79899 Other long term (current) drug therapy: Secondary | ICD-10-CM | POA: Diagnosis not present

## 2016-10-11 DIAGNOSIS — E118 Type 2 diabetes mellitus with unspecified complications: Secondary | ICD-10-CM | POA: Diagnosis not present

## 2016-10-11 DIAGNOSIS — Z0000 Encounter for general adult medical examination without abnormal findings: Secondary | ICD-10-CM | POA: Diagnosis not present

## 2016-10-11 DIAGNOSIS — Z1159 Encounter for screening for other viral diseases: Secondary | ICD-10-CM

## 2016-10-11 DIAGNOSIS — Z125 Encounter for screening for malignant neoplasm of prostate: Secondary | ICD-10-CM | POA: Diagnosis not present

## 2016-10-11 DIAGNOSIS — F3174 Bipolar disorder, in full remission, most recent episode manic: Secondary | ICD-10-CM | POA: Diagnosis not present

## 2016-10-11 DIAGNOSIS — E785 Hyperlipidemia, unspecified: Secondary | ICD-10-CM | POA: Diagnosis not present

## 2016-10-11 NOTE — Patient Instructions (Signed)
Schedule a lab only visit this week to return for fasting labs. Come fasting at least 4 hours prior. You may have water and black coffee.  Call your psychiatrist and notify her that we completed the ECG in our office and she can view this in the system.  We will be in touch regarding your labs when received.  It was a pleasure to see you today!

## 2016-10-11 NOTE — Addendum Note (Signed)
Addended by: Alvina ChouWALSH, TERRI J on: 10/11/2016 04:11 PM   Modules accepted: Orders

## 2016-10-11 NOTE — Progress Notes (Signed)
Patient ID: Willie Hughes, male   DOB: [birth year], 56 y.o.   MRN: [id number]  HPI: Mr. Willie Hughes is a 56 year old male who presents today for MWV.  Past Medical History:  Diagnosis Date  . Acne   . Allergic rhinitis   . Bipolar disorder (HCC)   . Erectile dysfunction   . Hyperlipidemia   . Hypothyroidism   . Type 2 diabetes mellitus (HCC)     Current Outpatient Prescriptions  Medication Sig Dispense Refill  . acetaminophen (TYLENOL) 500 MG tablet Take 1,000 mg by mouth 2 (two) times daily as needed.    . ARIPiprazole (ABILIFY) 15 MG tablet Take 0.5 tablets (7.5 mg total) by mouth daily. 15 tablet 3  . Ascorbic Acid (VITAMIN C) 100 MG tablet Take 100 mg by mouth daily.    Marland Kitchen aspirin 325 MG tablet Take 325 mg by mouth daily.    . Calcium Carbonate-Vitamin D3 (CALCIUM 600-D) 600-400 MG-UNIT TABS Take 1 tablet by mouth daily.     . Fenofibrate 150 MG CAPS Take 1 capsule (150 mg total) by mouth daily. 90 each 2  . fexofenadine (ALLEGRA) 180 MG tablet Take 180 mg by mouth.    Marland Kitchen glimepiride (AMARYL) 4 MG tablet Take 1 tablet (4 mg total) by mouth daily. 90 tablet 3  . glucose blood (ONE TOUCH TEST STRIPS) test strip 1 each by Other route as needed for other. Use as instructed    . GuaiFENesin (MUCINEX PO) Take 1,200 mg by mouth 2 (two) times daily.    . Insulin Glargine (LANTUS) 100 UNIT/ML Solostar Pen Inject 10 Units into the skin every evening. 15 mL 11  . Insulin Pen Needle (COMFORT EZ PEN NEEDLES) 31G X 6 MM MISC Use with Lantus Insulin once every evening. 100 each 11  . levothyroxine (SYNTHROID, LEVOTHROID) 50 MCG tablet TAKE ONE TABLET BY MOUTH ONCE DAILY BEFORE BREAKFAST 30 tablet 5  . lithium carbonate 300 MG capsule Take 4 capsules (1,200 mg total) by mouth daily. 120 capsule 3  . metFORMIN (GLUCOPHAGE) 1000 MG tablet Take 1 tablet (1,000 mg total) by mouth 2 (two) times daily. 180 tablet 2  . Multiple Vitamins-Minerals (CENTRUM SILVER) tablet Take 1 tablet by mouth daily.     . Omega-3  Fatty Acids (EQL FISH OIL PO) Take by mouth.    Letta Pate DELICA LANCETS 33G MISC Inject into the skin. Reported on 01/27/2016    . pseudoephedrine (SUDAFED) 120 MG 12 hr tablet     . ranitidine (ZANTAC) 150 MG tablet Take 150 mg by mouth 2 (two) times daily.    . sildenafil (VIAGRA) 100 MG tablet Take 1 tablet (100 mg total) by mouth daily as needed for erectile dysfunction. 16 tablet 5  . simvastatin (ZOCOR) 40 MG tablet Take 1 tablet (40 mg total) by mouth at bedtime. 90 tablet 2  . sulfamethoxazole-trimethoprim (BACTRIM DS,SEPTRA DS) 800-160 MG tablet Take 1 tablet by mouth 2 (two) times daily. 14 tablet 0  . tretinoin (RETIN-A) 0.05 % cream APPLY  CREAM TOPICALLY TO AFFECTED AREA AS NEEDED 45 g 5   No current facility-administered medications for this visit.     Allergies  Allergen Reactions  . Kava Kava     Family History  Problem Relation Age of Onset  . Heart attack Father   . Stroke Father   . Stroke Mother   . Diabetes Mother   . Depression Mother   . Depression Sister   . Bipolar disorder Paternal Aunt   .  Lupus Sister     Social History   Social History  . Marital status: Single    Spouse name: N/A  . Number of children: 0  . Years of education: N/A   Occupational History  . Not on file.   Social History Main Topics  . Smoking status: Former Smoker    Types: E-cigarettes  . Smokeless tobacco: Never Used     Comment: Viper  . Alcohol use 0.0 oz/week     Comment: a few beers a week  . Drug use: No     Comment: 1 yr ago used THC  . Sexual activity: Not on file   Other Topics Concern  . Not on file   Social History Narrative   Single and has one cat. Living in [place]. Never married. No kids.    Born and raised in [place] by parents. 2 sisters and 1 brother and pt is the youngest. Pt has 2 associates degrees in Sports administrator   Disabled for Bipolar disorder.  Last worked 2008    Enjoys watching TV, playing on the Internet.     Hospitiliaztions: None  Health Maintenance:    Flu: Completed in Fall 2017  Tetanus: Completed in 2015  Pneumovax: Completed in 2014  Prevnar: N/A  Zostavax: N/A  Colonoscopy: Never completed.   Eye Doctor: Due next week.  Dental Exam: Has not recently completed  PSA: Pending today.  Hep C Screening: Pending today.    Providers: Vernona Rieger, PCP; Dr. Michae Kava; Marko Stai, therapist.   I have personally reviewed and have noted: 1. The patient's medical and social history 2. Their use of alcohol, tobacco or illicit drugs 3. Their current medications and supplements 4. The patient's functional ability including ADL's, fall risks, home safety risks  and hearing or visual impairment. 5. Diet and physical activities 6. Evidence for depression or mood disorder  Subjective:   Review of Systems:   Constitutional: Denies fever, malaise, fatigue, headache or abrupt weight changes.  HEENT: Denies eye pain, eye redness, ear pain, ringing in the ears, wax buildup. He does have chronic allergic rhinitis with PND and nasal congestion. Respiratory: Denies difficulty breathing, shortness of breath, cough. Cardiovascular: Denies chest pain, chest tightness, palpitations or swelling in the hands or feet.  Gastrointestinal: Denies abdominal pain, bloating, diarrhea or blood in the stool. Some constipation, bowel movements every other day.  GU: Denies urgency, frequency, pain with urination, burning sensation, blood in urine, odor or discharge. Musculoskeletal: Denies decrease in range of motion, difficulty with gait, muscle aches. He does experience lower extremity joint aches. Skin: Denies redness, rashes, lesions or ulcercations. Skin wound is improving from last week. Neurological: Denies dizziness, difficulty with memory, difficulty with speech or problems with balance and coordination.  Psychiatric: Feels decently managed per psychiatry.   No other specific complaints in a  complete review of systems (except as listed in HPI above).  Objective:  PE:   Ht 6\' 4"  (1.93 m)   Wt 233 lb (105.7 kg)   BMI 28.36 kg/m  Wt Readings from Last 3 Encounters:  10/11/16 233 lb (105.7 kg)  10/06/16 225 lb 12.8 oz (102.4 kg)  09/20/16 228 lb (103.4 kg)    General: Appears their stated age, well developed, well nourished in NAD. Skin: Warm, dry and intact. No rashes, lesions or ulcerations noted. HEENT: Head: normal shape and size; Eyes: sclera white, no icterus, conjunctiva pink, PERRLA and EOMs intact; Ears: Tm's gray and intact, normal light reflex;  Nose: mucosa pink and moist, septum midline; Throat/Mouth: Teeth present, mucosa pink and moist, no exudate, lesions or ulcerations noted.  Neck: Normal range of motion. Neck supple, trachea midline. No massses, lumps or thyromegaly present.  Cardiovascular: Normal rate and rhythm. S1,S2 noted.  No murmur, rubs or gallops noted. No JVD or BLE edema. No carotid bruits noted. Pulmonary/Chest: Normal effort and positive vesicular breath sounds. No respiratory distress. No wheezes, rales or ronchi noted.  Abdomen: Soft and nontender. Normal bowel sounds, no bruits noted. No distention or masses noted. Liver, spleen and kidneys non palpable. Musculoskeletal: Normal range of motion. No signs of joint swelling. No difficulty with gait.  Neurological: Alert and oriented. Cranial nerves II-XII intact. Coordination normal. +DTRs bilaterally. Psychiatric: Mood and affect normal. Behavior is normal. Judgment and thought content normal.   EKG: Completed today. Right bundle branch block, NSR.   BMET    Component Value Date/Time   NA 134 (L) 06/19/2016 1624   K 4.2 06/19/2016 1624   CL 100 06/19/2016 1624   CO2 24 06/19/2016 1624   GLUCOSE 317 (H) 06/19/2016 1624   BUN 10 06/19/2016 1624   CREATININE 0.95 06/19/2016 1624   CALCIUM 9.7 06/19/2016 1624    Lipid Panel     Component Value Date/Time   CHOL 123 06/19/2016 1624    TRIG 385.0 (H) 06/19/2016 1624   HDL 28.10 (L) 06/19/2016 1624   CHOLHDL 4 06/19/2016 1624   VLDL 77.0 (H) 06/19/2016 1624    CBC    Component Value Date/Time   WBC 8.8 02/09/2016 1528   RBC 4.95 02/09/2016 1528   HGB 14.8 02/09/2016 1528   HCT 44.0 02/09/2016 1528   PLT 324.0 02/09/2016 1528   MCV 88.9 02/09/2016 1528   MCHC 33.5 02/09/2016 1528   RDW 12.8 02/09/2016 1528    Hgb A1C Lab Results  Component Value Date   HGBA1C 10.6 (H) 06/19/2016      Assessment and Plan:   Medicare Annual Wellness Visit:  Diet: He endorses a fair diet. Breakfast: Fruit, orange juice, pastry or doughnuts Lunch: Sandwich, chips, celery/carrots Dinner: Pasta, some frozen meals, meat Snacks: Jello, pudding Desserts: See above Beverages: Water, diet soda, orange juice, some coffee Physical activity: Sedentary Depression/mood screen: Managed by psychiatry. Hearing: Intact to whispered voice Visual acuity: Grossly normal, performs annual eye exam  ADLs: Capable Fall risk: None Home safety: Good Cognitive evaluation: Intact to orientation, naming, recall and repetition EOL planning: Adv directives, full code/ I agree  Preventative Medicine: Immunizations UTD. Eye exam scheduled for next week. Colonoscopy due, order placed. PSA and Hep C pending. Discussed the importance of a healthy diet and regular exercise in order for weight loss, and to reduce the risk of other medical diseases. Exam unremarkable. Labs pending. ECG completed.   Next appointment: Three months for diabetes check.

## 2016-10-11 NOTE — Assessment & Plan Note (Signed)
Immunizations UTD. Eye exam scheduled for next week. Colonoscopy due, order placed. PSA and Hep C pending. Discussed the importance of a healthy diet and regular exercise in order for weight loss, and to reduce the risk of other medical diseases. Exam unremarkable. Labs pending. ECG completed.  I have personally reviewed and have noted: 1. The patient's medical and social history 2. Their use of alcohol, tobacco or illicit drugs 3. Their current medications and supplements 4. The patient's functional ability including ADL's, fall  risks, home safety risks and hearing or visual  impairment. 5. Diet and physical activities 6. Evidence for depression or mood disorder

## 2016-10-11 NOTE — Assessment & Plan Note (Signed)
Lipids and LFT's pending.

## 2016-10-11 NOTE — Assessment & Plan Note (Signed)
Following with psychiatry, overall feels well managed. ECG completed in our office per psychiatry orders.

## 2016-10-11 NOTE — Progress Notes (Signed)
Pre visit review using our clinic review tool, if applicable. No additional management support is needed unless otherwise documented below in the visit note. 

## 2016-10-12 ENCOUNTER — Other Ambulatory Visit (INDEPENDENT_AMBULATORY_CARE_PROVIDER_SITE_OTHER): Payer: PPO

## 2016-10-12 DIAGNOSIS — Z125 Encounter for screening for malignant neoplasm of prostate: Secondary | ICD-10-CM

## 2016-10-12 DIAGNOSIS — E118 Type 2 diabetes mellitus with unspecified complications: Secondary | ICD-10-CM

## 2016-10-12 DIAGNOSIS — Z794 Long term (current) use of insulin: Secondary | ICD-10-CM | POA: Diagnosis not present

## 2016-10-12 DIAGNOSIS — E785 Hyperlipidemia, unspecified: Secondary | ICD-10-CM | POA: Diagnosis not present

## 2016-10-12 DIAGNOSIS — Z79899 Other long term (current) drug therapy: Secondary | ICD-10-CM | POA: Diagnosis not present

## 2016-10-12 DIAGNOSIS — Z1159 Encounter for screening for other viral diseases: Secondary | ICD-10-CM | POA: Diagnosis not present

## 2016-10-12 LAB — BASIC METABOLIC PANEL
BUN: 10 mg/dL (ref 6–23)
CALCIUM: 10.1 mg/dL (ref 8.4–10.5)
CO2: 28 mEq/L (ref 19–32)
Chloride: 103 mEq/L (ref 96–112)
Creatinine, Ser: 0.97 mg/dL (ref 0.40–1.50)
GFR: 85.29 mL/min (ref >60.00)
GLUCOSE: 228 mg/dL — AB (ref 70–99)
Potassium: 4.7 mEq/L (ref 3.5–5.1)
Sodium: 135 mEq/L (ref 135–145)

## 2016-10-12 LAB — LIPID PANEL
CHOL/HDL RATIO: 4
CHOLESTEROL: 119 mg/dL (ref 0–200)
HDL: 27 mg/dL — ABNORMAL LOW (ref >39.00)
LDL CALC: 54 mg/dL (ref 0–99)
NonHDL: 92.03
TRIGLYCERIDES: 188 mg/dL — AB (ref 0.0–149.0)
VLDL: 37.6 mg/dL (ref 0.0–40.0)

## 2016-10-12 LAB — PSA, MEDICARE: PSA: 0.44 ng/mL (ref 0.10–4.00)

## 2016-10-12 LAB — HEMOGLOBIN A1C: Hgb A1c MFr Bld: 10.1 % — ABNORMAL HIGH (ref 4.6–6.5)

## 2016-10-13 LAB — HEPATITIS C ANTIBODY: HCV AB: NEGATIVE

## 2016-10-19 ENCOUNTER — Encounter (HOSPITAL_COMMUNITY): Payer: Self-pay | Admitting: Clinical

## 2016-10-19 ENCOUNTER — Ambulatory Visit (INDEPENDENT_AMBULATORY_CARE_PROVIDER_SITE_OTHER): Payer: PPO | Admitting: Clinical

## 2016-10-19 DIAGNOSIS — H5213 Myopia, bilateral: Secondary | ICD-10-CM | POA: Diagnosis not present

## 2016-10-19 DIAGNOSIS — F250 Schizoaffective disorder, bipolar type: Secondary | ICD-10-CM

## 2016-10-19 DIAGNOSIS — E119 Type 2 diabetes mellitus without complications: Secondary | ICD-10-CM | POA: Diagnosis not present

## 2016-10-19 DIAGNOSIS — H52223 Regular astigmatism, bilateral: Secondary | ICD-10-CM | POA: Diagnosis not present

## 2016-10-19 DIAGNOSIS — H524 Presbyopia: Secondary | ICD-10-CM | POA: Diagnosis not present

## 2016-10-19 NOTE — Progress Notes (Signed)
   THERAPIST PROGRESS NOTE  Session Time: 1:32 - 2:28  Participation Level: Active  Behavioral Response: CasualAlertDepressed   Type of Therapy: Individual Therapy  Treatment Goals addressed: improve psychiatric symptoms, elevate mood, improve unhelpful thought patterns, learn about diagnosis, healthy coping skills  Interventions: CBT and Motivational Interviewing   Summary: Cody Albus is a 56 y.o.. male who presents with Schizoaffective disorder, bipolar type  Suicidal/Homicidal: No - without intent or plan  Therapist Response:  Fritz Pickerel met with clinician for an individual session. Shravan discussed his psychiatric symptoms, his current life events, and his homework. Yordi shared that he did not bring his homework packet with him but that he had completed it. Client an clinician discussed his homework. Clinician gave him the next packet in the series. Which he agreed to complete. Adyn shared that he was continues to have a lot of difficulty interacting with others because he believes every body hates him for being blond hair, blue eyed and pink skinned. Clinician asked open ended questions and Taylen shared how he felt when he thought "everybody hates me" Clinician asked open ended questions and Lenorris provided evidence for his thought. Clinician asked open ended questions and Oran reluctantly provided evidence against the thought. Client and clinician discussed healthier alternative thoughts.  Khambrel is very attached to the orignial thought so while he was able to formulate healthier alternative thoughts he was unable to see how investing in them might improve his emotions. Client and clinician discussed the thought emotion connection. Client and clinician agreed to explore this further at future sessions.  Plan: Return again in 1-2 weeks.  Diagnosis: Axis I: Schizoaffective disorder, bipolar type    Daijanae Rafalski A, LCSW 10/19/2016

## 2016-10-22 LAB — HM DIABETES EYE EXAM

## 2016-11-02 ENCOUNTER — Ambulatory Visit (INDEPENDENT_AMBULATORY_CARE_PROVIDER_SITE_OTHER): Payer: PPO | Admitting: Psychiatry

## 2016-11-02 ENCOUNTER — Encounter (HOSPITAL_COMMUNITY): Payer: Self-pay | Admitting: Psychiatry

## 2016-11-02 DIAGNOSIS — Z818 Family history of other mental and behavioral disorders: Secondary | ICD-10-CM | POA: Diagnosis not present

## 2016-11-02 DIAGNOSIS — G4700 Insomnia, unspecified: Secondary | ICD-10-CM

## 2016-11-02 DIAGNOSIS — Z7982 Long term (current) use of aspirin: Secondary | ICD-10-CM

## 2016-11-02 DIAGNOSIS — F4010 Social phobia, unspecified: Secondary | ICD-10-CM

## 2016-11-02 DIAGNOSIS — Z79899 Other long term (current) drug therapy: Secondary | ICD-10-CM

## 2016-11-02 DIAGNOSIS — Z87891 Personal history of nicotine dependence: Secondary | ICD-10-CM | POA: Diagnosis not present

## 2016-11-02 DIAGNOSIS — Z794 Long term (current) use of insulin: Secondary | ICD-10-CM

## 2016-11-02 DIAGNOSIS — F3160 Bipolar disorder, current episode mixed, unspecified: Secondary | ICD-10-CM | POA: Diagnosis not present

## 2016-11-02 MED ORDER — LITHIUM CARBONATE 300 MG PO CAPS: 1200.0000 mg | ORAL_CAPSULE | Freq: Every day | ORAL | 3 refills | Status: DC

## 2016-11-02 MED ORDER — ARIPIPRAZOLE 15 MG PO TABS: 7.5000 mg | ORAL_TABLET | Freq: Every day | ORAL | 3 refills | Status: DC
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
## 2016-11-09 ENCOUNTER — Ambulatory Visit (HOSPITAL_COMMUNITY): Payer: Self-pay | Admitting: Clinical

## 2016-11-14 NOTE — Telephone Encounter (Signed)
Seen 3.21.18

## 2016-12-01 ENCOUNTER — Encounter: Payer: Self-pay | Admitting: Primary Care

## 2016-12-01 DIAGNOSIS — N529 Male erectile dysfunction, unspecified: Secondary | ICD-10-CM

## 2016-12-01 MED ORDER — SILDENAFIL CITRATE 100 MG PO TABS: 100.0000 mg | ORAL_TABLET | Freq: Every day | ORAL | 5 refills | Status: DC | PRN

## 2016-12-04 ENCOUNTER — Telehealth: Payer: Self-pay | Admitting: Primary Care

## 2016-12-04 NOTE — Telephone Encounter (Signed)
What are his sugars running? Fasting in AM: Before Lunch: Before Dinner: Bedtime:  He's taking too much tylenol as he should not be exceeding 3000 mg in 24 hours. He can cause a lot of damage to his liver. I would most definitely like to see him in the office when I'm back. Please schedule him for a 30 min. Visit.

## 2016-12-04 NOTE — Telephone Encounter (Signed)
Patient came in to pick up Rx. However, wanted Willie Hughes to know that he made some changes. He had increase his glimepiride to 8 mg. He also taking 1000 mg of acetaminophen every 4 hours. Patient is concern and would like to know if he should be seen or what. Patient stated that if Willie Hughes can send a message through MyChart for what he should do.

## 2016-12-05 NOTE — Telephone Encounter (Signed)
Send patient a message through MyChart as he requested.

## 2016-12-06 NOTE — Telephone Encounter (Signed)
Patient had schedule office appointment on 12/20/2016

## 2016-12-20 ENCOUNTER — Ambulatory Visit (INDEPENDENT_AMBULATORY_CARE_PROVIDER_SITE_OTHER): Payer: PPO | Admitting: Primary Care

## 2016-12-20 ENCOUNTER — Encounter: Payer: Self-pay | Admitting: Primary Care

## 2016-12-20 DIAGNOSIS — J309 Allergic rhinitis, unspecified: Secondary | ICD-10-CM | POA: Diagnosis not present

## 2016-12-20 DIAGNOSIS — M1990 Unspecified osteoarthritis, unspecified site: Secondary | ICD-10-CM | POA: Diagnosis not present

## 2016-12-20 NOTE — Patient Instructions (Signed)
Continue Allegra and Zantac daily. Stop using Sudafed as this is likely causing an increase in your blood pressure.  Start exercising. You should be getting 150 minutes of moderate intensity exercise weekly. This will help with arthritic pain.  Do not exceed 3000 mg of tylenol in 24 hours.  Follow up in June as scheduled.  It was a pleasure to see you today!

## 2016-12-20 NOTE — Assessment & Plan Note (Signed)
Hoarse voice and postnasal drip has improved since initiation of Zantac. Continue Allegra. Discouraged use of Sudafed as it is likely causing an increase in his blood pressure. He will monitor his blood pressure and bring his readings to his upcoming appointment in June.

## 2016-12-20 NOTE — Assessment & Plan Note (Signed)
Chronic to hips and knees. Suspect increased symptoms due to periods of inactivity and lack of exercise. Discussed to start exercising and to refrain from sedentary lifestyle. Exam today unremarkable. Discussed not to exceed 3000 mg of Tylenol 24 hours.

## 2016-12-20 NOTE — Progress Notes (Signed)
Subjective:    Patient ID: Willie Hughes, male    DOB: 11-Mar-1961, 56 y.o.   MRN: [id number]  HPI  Mr. Quesinberry is a 56 year old male with a history of chronic allergic rhinitis, uncontrolled type 2 diabetes, hypothyroidism, bipolar disorder who presents today with multiple complaints.  1) Hoarse Voice: Also with postnasal drip and throat congestion that has improved. Seen on several occasions for this complaint. He has tried Careers adviser, Sudafed, Mucinex, and Zyrtec in the past. It was recommended he start Singular and Zantac for symptoms for which he refused.  Since his last visit he's started taking Zantac with improvement. He denies fevers, sore throat. He does not wish to start Singulair given potential side effects of contracting a rare skin disorder. He cannot use Flonase as it causes nasal polyps. Overall he has improved since starting Zantac.  2) Arthralgias: Located to bilateral hips and knees. His hip and knee pain has been present for the past 3-4 years. He is mostly inactive during the day and has been sedentary for the past 10 years. He's been taking tylenol 2000 mg daily with some improvement. He cannot take NSAID's due to Lithium use. He denies swelling, erythema, injury/trauma.  Review of Systems  Constitutional: Negative for fever.  HENT: Positive for congestion and postnasal drip. Negative for sore throat.        Chronic hoarse voice  Respiratory: Negative for cough and shortness of breath.   Musculoskeletal: Positive for arthralgias.  Skin: Negative for color change.  Allergic/Immunologic: Positive for environmental allergies.       Past Medical History:  Diagnosis Date  . Acne   . Allergic rhinitis   . Bipolar disorder (HCC)   . Erectile dysfunction   . Hyperlipidemia   . Hypothyroidism   . Type 2 diabetes mellitus (HCC)      Social History   Social History  . Marital status: Single    Spouse name: N/A  . Number of children: 0  . Years of education: N/A    Occupational History  . Not on file.   Social History Main Topics  . Smoking status: Former Smoker    Types: E-cigarettes  . Smokeless tobacco: Never Used     Comment: Vapor  . Alcohol use 0.0 oz/week     Comment: a few beers a month  . Drug use: Yes    Types: Marijuana     Comment: over 1 yr ago was last use THC  . Sexual activity: Not on file   Other Topics Concern  . Not on file   Social History Narrative   Single and has one cat. Living in [place]. Never married. No kids.    Born and raised in [place] by parents. 2 sisters and 1 brother and pt is the youngest. Pt has 2 associates degrees in Sports administrator   Disabled for Bipolar disorder.  Last worked 2008    Enjoys watching TV, playing on the Internet.    Past Surgical History:  Procedure Laterality Date  . WISDOM TOOTH EXTRACTION      Family History  Problem Relation Age of Onset  . Heart attack Father   . Stroke Father   . Stroke Mother   . Diabetes Mother   . Depression Mother   . Depression Sister   . Bipolar disorder Paternal Aunt   . Lupus Sister     Allergies  Allergen Reactions  . Kava Kava     Current Outpatient Prescriptions  on File Prior to Visit  Medication Sig Dispense Refill  . acetaminophen (TYLENOL) 500 MG tablet Take 1,000 mg by mouth 2 (two) times daily as needed.    . ARIPiprazole (ABILIFY) 15 MG tablet Take 0.5 tablets (7.5 mg total) by mouth daily. 15 tablet 3  . Ascorbic Acid (VITAMIN C) 100 MG tablet Take 100 mg by mouth daily.    Marland Kitchen. aspirin 325 MG tablet Take 325 mg by mouth daily.    . Calcium Carbonate-Vitamin D3 (CALCIUM 600-D) 600-400 MG-UNIT TABS Take 1 tablet by mouth daily.     . Fenofibrate 150 MG CAPS Take 1 capsule (150 mg total) by mouth daily. 90 each 2  . fexofenadine (ALLEGRA) 180 MG tablet Take 180 mg by mouth.    Marland Kitchen. glimepiride (AMARYL) 4 MG tablet Take 1 tablet (4 mg total) by mouth daily. 90 tablet 3  . glucose blood (ONE TOUCH TEST STRIPS) test strip  1 each by Other route as needed for other. Use as instructed    . GuaiFENesin (MUCINEX PO) Take 1,200 mg by mouth 2 (two) times daily.    . Insulin Glargine (LANTUS) 100 UNIT/ML Solostar Pen Inject 10 Units into the skin every evening. 15 mL 11  . Insulin Pen Needle (COMFORT EZ PEN NEEDLES) 31G X 6 MM MISC Use with Lantus Insulin once every evening. 100 each 11  . levothyroxine (SYNTHROID, LEVOTHROID) 50 MCG tablet TAKE ONE TABLET BY MOUTH ONCE DAILY BEFORE BREAKFAST 30 tablet 5  . lithium carbonate 300 MG capsule Take 4 capsules (1,200 mg total) by mouth daily. 120 capsule 3  . metFORMIN (GLUCOPHAGE) 1000 MG tablet Take 1 tablet (1,000 mg total) by mouth 2 (two) times daily. 180 tablet 2  . Multiple Vitamins-Minerals (CENTRUM SILVER) tablet Take 1 tablet by mouth daily.     . Omega-3 Fatty Acids (EQL FISH OIL PO) Take by mouth.    Letta Pate. ONETOUCH DELICA LANCETS 33G MISC Inject into the skin. Reported on 01/27/2016    . pseudoephedrine (SUDAFED) 120 MG 12 hr tablet     . ranitidine (ZANTAC) 150 MG tablet Take 150 mg by mouth 2 (two) times daily.    . sildenafil (VIAGRA) 100 MG tablet Take 1 tablet (100 mg total) by mouth daily as needed for erectile dysfunction. 16 tablet 5  . simvastatin (ZOCOR) 40 MG tablet Take 1 tablet (40 mg total) by mouth at bedtime. 90 tablet 2  . tretinoin (RETIN-A) 0.05 % cream APPLY  CREAM TOPICALLY TO AFFECTED AREA AS NEEDED 45 g 5   No current facility-administered medications on file prior to visit.     BP (!) 144/94   Pulse 90   Temp 98 F (36.7 C) (Oral)   Ht 6\' 4"  (1.93 m)   Wt 224 lb (101.6 kg)   SpO2 97%   BMI 27.27 kg/m    Objective:   Physical Exam  Constitutional: He appears well-nourished. He does not appear ill.  HENT:  Nose: Mucosal edema present.  Mouth/Throat: Oropharynx is clear and moist.  Neck: Neck supple.  Cardiovascular: Normal rate and regular rhythm.   Pulmonary/Chest: Effort normal and breath sounds normal.  Skin: Skin is warm and  dry.          Assessment & Plan:

## 2017-01-15 ENCOUNTER — Ambulatory Visit: Payer: PPO | Admitting: Primary Care

## 2017-01-15 ENCOUNTER — Telehealth: Payer: Self-pay | Admitting: Primary Care

## 2017-01-15 NOTE — Telephone Encounter (Signed)
Patient did not come in for their appointment today for 3 mo follow up. Please let me know if patient needs to be contacted immediately for follow up or no follow up needed. Do you want to charge the NSF? °

## 2017-01-15 NOTE — Telephone Encounter (Signed)
Please reschedule patient has his convenience. No fee.

## 2017-01-29 ENCOUNTER — Ambulatory Visit: Payer: Self-pay | Admitting: Primary Care

## 2017-02-01 ENCOUNTER — Ambulatory Visit (INDEPENDENT_AMBULATORY_CARE_PROVIDER_SITE_OTHER): Payer: PPO | Admitting: Primary Care

## 2017-02-01 ENCOUNTER — Encounter: Payer: Self-pay | Admitting: Primary Care

## 2017-02-01 ENCOUNTER — Ambulatory Visit (INDEPENDENT_AMBULATORY_CARE_PROVIDER_SITE_OTHER)
Admission: RE | Admit: 2017-02-01 | Discharge: 2017-02-01 | Disposition: A | Discharge status: Home / Self Care | Payer: PPO | Source: Ambulatory Visit | Attending: Primary Care | Admitting: Primary Care

## 2017-02-01 VITALS — BP 136/80 | HR 96 | Temp 98.8°F | Ht 76.0 in | Wt 221.8 lb

## 2017-02-01 DIAGNOSIS — Z87891 Personal history of nicotine dependence: Secondary | ICD-10-CM | POA: Insufficient documentation

## 2017-02-01 DIAGNOSIS — E119 Type 2 diabetes mellitus without complications: Secondary | ICD-10-CM | POA: Diagnosis not present

## 2017-02-01 DIAGNOSIS — J309 Allergic rhinitis, unspecified: Secondary | ICD-10-CM

## 2017-02-01 DIAGNOSIS — N529 Male erectile dysfunction, unspecified: Secondary | ICD-10-CM | POA: Diagnosis not present

## 2017-02-01 DIAGNOSIS — Z794 Long term (current) use of insulin: Secondary | ICD-10-CM | POA: Diagnosis not present

## 2017-02-01 DIAGNOSIS — E039 Hypothyroidism, unspecified: Secondary | ICD-10-CM | POA: Diagnosis not present

## 2017-02-01 DIAGNOSIS — R0602 Shortness of breath: Secondary | ICD-10-CM

## 2017-02-01 DIAGNOSIS — R490 Dysphonia: Secondary | ICD-10-CM | POA: Diagnosis not present

## 2017-02-01 DIAGNOSIS — R918 Other nonspecific abnormal finding of lung field: Secondary | ICD-10-CM | POA: Diagnosis not present

## 2017-02-01 LAB — HEMOGLOBIN A1C: Hgb A1c MFr Bld: 10.6 % — ABNORMAL HIGH (ref 4.6–6.5)

## 2017-02-01 LAB — TSH: TSH: 5.43 u[IU]/mL — AB (ref 0.35–4.50)

## 2017-02-01 NOTE — Assessment & Plan Note (Signed)
Still taking sudafed daily, despite recommendations to stop. Discussed harmful effects of daily use and chronic rebound congestion. Recommended allegra and Mucinex.

## 2017-02-01 NOTE — Assessment & Plan Note (Signed)
Suspect increased symptoms secondary to hyperglycemia. Discussed importance of strict control of glucose. Also checking TSH.

## 2017-02-01 NOTE — Progress Notes (Signed)
Subjective:    Patient ID: Willie Hughes, male    DOB: [birth year], 56 y.o.   MRN: [id number]  HPI  [name] is a 56 year old male who presents today with multiple complaints and follow up.  1) Type 2 Diabetes: Currently prescribed Metformin 1000 mg BID, Glimepiride 4 mg daily, Lantus 15 units in the middle of the day. His last A1C was 10.1 in March 2018. he has a history of medication non compliance.  He is due for repeat A1C today. Currently managed on statin. He's not checking his blood sugars at all as he's forgetting. He's cutting back on portion sizes at meal times and has lost some weight. He's feeling "run down" overall.   2) Erectile Dysfunction: Currently managed on sildenafil 100 mg PRN. Believes his erectile dysfunction is getting worse as it's more difficult for him to obtain and maintaining an erection. He's thinking about attending [facility] for treatment.   3) Hoarse Voice: History of chronic allergic rhinitis. Suspected this to be secondary to either allergies or GERD. He had no improvement on OTC antihistamines and refused to try Singulair as prescribed. He's currently taking Zantac that was recommended several visits ago with improvement in "drainage". He's still taking Sudafed despite recommendations. He is vaping heavily daily, prior tobacco abuse and quit one year ago.  4) Tobacco Abuse: Previously told he had COPD. Smoked cigarettes from age 80, reduced cigarette use 5 years ago as he was vaping and smoking intermittently. He stopped smoking completely 1 year ago. He's still vaping. He's experiencing symptoms of shortness of breath with exertion and vaping.   Review of Systems  Constitutional: Positive for fatigue.  HENT: Positive for postnasal drip and voice change.   Respiratory: Positive for shortness of breath. Negative for cough and wheezing.   Cardiovascular: Negative for chest pain.  Neurological: Negative for dizziness and numbness.       Past Medical  History:  Diagnosis Date  . Acne   . Allergic rhinitis   . Bipolar disorder (HCC)   . Erectile dysfunction   . Hyperlipidemia   . Hypothyroidism   . Type 2 diabetes mellitus (HCC)      Social History   Social History  . Marital status: Single    Spouse name: N/A  . Number of children: 0  . Years of education: N/A   Occupational History  . Not on file.   Social History Main Topics  . Smoking status: Former Smoker    Types: E-cigarettes  . Smokeless tobacco: Never Used     Comment: Vapor  . Alcohol use 0.0 oz/week     Comment: a few beers a month  . Drug use: Yes    Types: Marijuana     Comment: over 1 yr ago was last use THC  . Sexual activity: Not on file   Other Topics Concern  . Not on file   Social History Narrative   Single and has one cat. Living in [place]. Never married. No kids.    Born and raised in [place] by parents. 2 sisters and 1 brother and pt is the youngest. Pt has 2 associates degrees in Sports administrator   Disabled for Bipolar disorder.  Last worked 2008    Enjoys watching TV, playing on the Internet.    Past Surgical History:  Procedure Laterality Date  . WISDOM TOOTH EXTRACTION      Family History  Problem Relation Age of Onset  . Heart  attack Father   . Stroke Father   . Stroke Mother   . Diabetes Mother   . Depression Mother   . Depression Sister   . Bipolar disorder Paternal Aunt   . Lupus Sister     Allergies  Allergen Reactions  . Kava Kava     Current Outpatient Prescriptions on File Prior to Visit  Medication Sig Dispense Refill  . acetaminophen (TYLENOL) 500 MG tablet Take 1,000 mg by mouth 2 (two) times daily as needed.    . ARIPiprazole (ABILIFY) 15 MG tablet Take 0.5 tablets (7.5 mg total) by mouth daily. 15 tablet 3  . Ascorbic Acid (VITAMIN C) 100 MG tablet Take 100 mg by mouth daily.    Marland Kitchen. aspirin 325 MG tablet Take 325 mg by mouth daily.    . Calcium Carbonate-Vitamin D3 (CALCIUM 600-D) 600-400 MG-UNIT  TABS Take 1 tablet by mouth daily.     . Fenofibrate 150 MG CAPS Take 1 capsule (150 mg total) by mouth daily. 90 each 2  . fexofenadine (ALLEGRA) 180 MG tablet Take 180 mg by mouth.    Marland Kitchen. glimepiride (AMARYL) 4 MG tablet Take 1 tablet (4 mg total) by mouth daily. 90 tablet 3  . glucose blood (ONE TOUCH TEST STRIPS) test strip 1 each by Other route as needed for other. Use as instructed    . GuaiFENesin (MUCINEX PO) Take 1,200 mg by mouth 2 (two) times daily.    . Insulin Glargine (LANTUS) 100 UNIT/ML Solostar Pen Inject 10 Units into the skin every evening. 15 mL 11  . Insulin Pen Needle (COMFORT EZ PEN NEEDLES) 31G X 6 MM MISC Use with Lantus Insulin once every evening. 100 each 11  . levothyroxine (SYNTHROID, LEVOTHROID) 50 MCG tablet TAKE ONE TABLET BY MOUTH ONCE DAILY BEFORE BREAKFAST 30 tablet 5  . lithium carbonate 300 MG capsule Take 4 capsules (1,200 mg total) by mouth daily. 120 capsule 3  . metFORMIN (GLUCOPHAGE) 1000 MG tablet Take 1 tablet (1,000 mg total) by mouth 2 (two) times daily. 180 tablet 2  . Multiple Vitamins-Minerals (CENTRUM SILVER) tablet Take 1 tablet by mouth daily.     . Omega-3 Fatty Acids (EQL FISH OIL PO) Take by mouth.    Letta Pate. ONETOUCH DELICA LANCETS 33G MISC Inject into the skin. Reported on 01/27/2016    . pseudoephedrine (SUDAFED) 120 MG 12 hr tablet     . ranitidine (ZANTAC) 150 MG tablet Take 150 mg by mouth 2 (two) times daily.    . sildenafil (VIAGRA) 100 MG tablet Take 1 tablet (100 mg total) by mouth daily as needed for erectile dysfunction. 16 tablet 5  . simvastatin (ZOCOR) 40 MG tablet Take 1 tablet (40 mg total) by mouth at bedtime. 90 tablet 2  . tretinoin (RETIN-A) 0.05 % cream APPLY  CREAM TOPICALLY TO AFFECTED AREA AS NEEDED 45 g 5   No current facility-administered medications on file prior to visit.     BP 136/80   Pulse 96   Temp 98.8 F (37.1 C) (Oral)   Ht 6\' 4"  (1.93 m)   Wt 221 lb 12.8 oz (100.6 kg)   SpO2 98%   BMI 27.00 kg/m     Objective:   Physical Exam  Constitutional: He appears well-nourished.  HENT:  Mouth/Throat: Oropharynx is clear and moist.  Neck: Neck supple.  Cardiovascular: Normal rate and regular rhythm.   Pulmonary/Chest: Effort normal and breath sounds normal.  Skin: Skin is warm and dry.  Psychiatric: He  has a normal mood and affect.          Assessment & Plan:

## 2017-02-01 NOTE — Assessment & Plan Note (Signed)
Not checking blood glucose as discussed. Again, discussed the danger of taking insulin and not checking blood sugars, he verbalized understanding. Check A1C today. Continue medications as is until A1C returns. Managed on statin.

## 2017-02-01 NOTE — Patient Instructions (Addendum)
Complete lab work and xray prior to leaving today.   You must start checking your blood sugars, especially since you're on insulin. Check your sugars first thing when waking, 2 hours after lunch, and before bed. Record your readings.  I will be in touch with you once we receive your results.  It was a pleasure to see you today!

## 2017-02-01 NOTE — Assessment & Plan Note (Signed)
Smoked since age 56, stopped 1 year ago. Now only vaping. Strongly discouraged vaping. This is likely contributing to chronic drainage/voice hoarseness. Check chest xray today given SOB and chronic voice changes. His voice does seem normal today, also on prior visits. Consider adding inhaler.

## 2017-02-01 NOTE — Assessment & Plan Note (Signed)
Increased fatigue. Could be due to hyperglycemia. Will check TSH given Lithium use.

## 2017-02-02 MED ORDER — ALBUTEROL SULFATE HFA 108 (90 BASE) MCG/ACT IN AERS
2.0000 | INHALATION_SPRAY | RESPIRATORY_TRACT | 0 refills | Status: DC | PRN
Start: 2017-02-02 — End: 2021-09-29

## 2017-02-06 ENCOUNTER — Encounter: Payer: Self-pay | Admitting: Primary Care

## 2017-02-08 ENCOUNTER — Ambulatory Visit (INDEPENDENT_AMBULATORY_CARE_PROVIDER_SITE_OTHER): Payer: PPO | Admitting: Psychiatry

## 2017-02-08 ENCOUNTER — Encounter (HOSPITAL_COMMUNITY): Payer: Self-pay | Admitting: Psychiatry

## 2017-02-08 VITALS — BP 122/80 | HR 105 | Ht 76.0 in | Wt 224.0 lb

## 2017-02-08 DIAGNOSIS — Z79899 Other long term (current) drug therapy: Secondary | ICD-10-CM

## 2017-02-08 DIAGNOSIS — F29 Unspecified psychosis not due to a substance or known physiological condition: Secondary | ICD-10-CM

## 2017-02-08 DIAGNOSIS — F17210 Nicotine dependence, cigarettes, uncomplicated: Secondary | ICD-10-CM

## 2017-02-08 DIAGNOSIS — F4010 Social phobia, unspecified: Secondary | ICD-10-CM | POA: Diagnosis not present

## 2017-02-08 DIAGNOSIS — F3160 Bipolar disorder, current episode mixed, unspecified: Secondary | ICD-10-CM | POA: Diagnosis not present

## 2017-02-08 DIAGNOSIS — G4700 Insomnia, unspecified: Secondary | ICD-10-CM | POA: Diagnosis not present

## 2017-02-08 MED ORDER — ARIPIPRAZOLE 15 MG PO TABS: 7.5000 mg | ORAL_TABLET | Freq: Every day | ORAL | 3 refills | Status: DC

## 2017-02-08 MED ORDER — LITHIUM CARBONATE 300 MG PO CAPS: 1200.0000 mg | ORAL_CAPSULE | Freq: Every day | ORAL | 3 refills | Status: DC
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
## 2017-02-28 ENCOUNTER — Ambulatory Visit: Payer: PPO | Admitting: Primary Care

## 2017-03-08 ENCOUNTER — Encounter: Payer: Self-pay | Admitting: Primary Care

## 2017-03-08 DIAGNOSIS — R7989 Other specified abnormal findings of blood chemistry: Secondary | ICD-10-CM

## 2017-03-09 ENCOUNTER — Other Ambulatory Visit (INDEPENDENT_AMBULATORY_CARE_PROVIDER_SITE_OTHER): Payer: PPO

## 2017-03-09 DIAGNOSIS — R946 Abnormal results of thyroid function studies: Secondary | ICD-10-CM

## 2017-03-09 DIAGNOSIS — R7989 Other specified abnormal findings of blood chemistry: Secondary | ICD-10-CM

## 2017-03-09 LAB — TSH: TSH: 5.08 m[IU]/L — AB (ref 0.40–4.50)

## 2017-03-09 LAB — T4, FREE: Free T4: 1.1 ng/dL (ref 0.8–1.8)

## 2017-03-09 NOTE — Addendum Note (Signed)
Addended by: London Sheer T on: 03/09/2017 04:18 PM   Modules accepted: Orders

## 2017-03-10 LAB — T3: T3, Total: 100.1 ng/dL (ref 76–181)

## 2017-03-21 ENCOUNTER — Encounter: Payer: Self-pay | Admitting: Primary Care

## 2017-03-21 ENCOUNTER — Ambulatory Visit (INDEPENDENT_AMBULATORY_CARE_PROVIDER_SITE_OTHER): Payer: PPO | Admitting: Primary Care

## 2017-03-21 DIAGNOSIS — E119 Type 2 diabetes mellitus without complications: Secondary | ICD-10-CM

## 2017-03-21 DIAGNOSIS — E032 Hypothyroidism due to medicaments and other exogenous substances: Secondary | ICD-10-CM

## 2017-03-21 MED ORDER — INSULIN PEN NEEDLE 31G X 6 MM MISC: 11 refills | Status: DC

## 2017-03-21 MED ORDER — INSULIN GLARGINE 100 UNIT/ML SOLOSTAR PEN: 25.0000 [IU] | PEN_INJECTOR | Freq: Every evening | SUBCUTANEOUS | 11 refills | Status: DC

## 2017-03-21 NOTE — Patient Instructions (Addendum)
We've increased your Lantus to 25 units every evening.  You MUST check your blood sugars three times daily: Fasting before you eat 2 hours after lunch Before bed  Please record these readings and I'll message you for those readings in 2 weeks.   You MUST improve your diet. Increase vegetables, fruit, whole grains, lean protein. Limit pasta.  Ensure you are consuming 64 ounces of water daily.  Schedule a follow up visit in 1 month. Bring your sugar logs to the office for that visit.  It was a pleasure to see you today!

## 2017-03-21 NOTE — Assessment & Plan Note (Signed)
Uncontrolled based off of last A1c and reports of sporadic blood sugar readings.  Long discussion today about importance of checking blood sugars 3 times daily as instructed on prior visits. Discussed that he likely needs mealtime insulin such as NovoLog but cannot prescribe this as he is not checking his blood sugars regularly as instructed.  He verbalized understanding and reported that he would start checking his blood sugars 3 times daily as instructed. Will call in 2 weeks for his sugar logs, at that time consider adding NovoLog coverage.  Increase Lantus to 25 units at bedtime. Continue metformin and glimepiride. Follow-up in one month with sugar logs.

## 2017-03-21 NOTE — Progress Notes (Signed)
Subjective:    Patient ID: Willie Hughes, male    DOB: 08/23/1960, 56 y.o.   MRN: [id number]  HPI  Willie Hughes is a 56 year old male who presents today for follow up of diabetes. He's feeling very tired most of the time. He will take caffeine pills in order to stay awake. Wakes in the afternoon stays up until 3-4 am, sometimes 24 hours.   Current medications include: Lantus 15 units every evening, metformin 1000 mg BID, glimepiride 4 mg once daily.  He is checking his blood glucose once weekly, sporadically when he "feels bad". He will get readings ranging low 300's-low 400's. He's noted some readings in the 600s.   Last A1C: 10.6 on 02/01/17 Last Eye Exam: Completed in April  Last Foot Exam: Due in January 2019 Pneumonia Vaccination: Completed pneumovax in 2014, due in 2019 ACE/ARB: None Statin: Simvastatin   Diet currently consists of:  Breakfast: Orange juice, fruit, cinnamon roll Lunch: Sandwich, hot dogs, chips Dinner: Pasta, hamburger helper with extra pasta, frozen pizza, frozen pasta Snacks: None Desserts: Occasional ice cream Beverages: Orange juice, water mostly, tea with splenda  Exercise: Not exercising.      Review of Systems  Constitutional: Positive for fatigue.  Eyes: Negative for visual disturbance.  Respiratory: Negative for shortness of breath.   Cardiovascular: Negative for chest pain.  Neurological: Negative for dizziness and weakness.       Past Medical History:  Diagnosis Date  . Acne   . Allergic rhinitis   . Bipolar disorder (HCC)   . Erectile dysfunction   . Hyperlipidemia   . Hypothyroidism   . Type 2 diabetes mellitus (HCC)      Social History   Social History  . Marital status: Single    Spouse name: N/A  . Number of children: 0  . Years of education: N/A   Occupational History  . Not on file.   Social History Main Topics  . Smoking status: Former Smoker    Types: E-cigarettes  . Smokeless tobacco: Never Used     Comment:  Vapor  . Alcohol use 0.0 oz/week     Comment: a few beers a month  . Drug use: Yes    Types: Marijuana     Comment: over 8139yr ago was last use THC  . Sexual activity: Not on file   Other Topics Concern  . Not on file   Social History Narrative   Single and has one cat. Living in [place]. Never married. No kids.    Born and raised in [place] by parents. 2 sisters and 1 brother and pt is the youngest. Pt has 2 associates degrees in Sports administratorBiotech and Mechanical design   Disabled for Bipolar disorder.  Last worked 2008    Enjoys watching TV, playing on the Internet.    Past Surgical History:  Procedure Laterality Date  . WISDOM TOOTH EXTRACTION      Family History  Problem Relation Age of Onset  . Heart attack Father   . Stroke Father   . Stroke Mother   . Diabetes Mother   . Depression Mother   . Depression Sister   . Bipolar disorder Paternal Aunt   . Lupus Sister     Allergies  Allergen Reactions  . Kava Kava     Current Outpatient Prescriptions on File Prior to Visit  Medication Sig Dispense Refill  . acetaminophen (TYLENOL) 500 MG tablet Take 1,000 mg by mouth 2 (two) times daily as needed.    .Marland Kitchen  albuterol (PROVENTIL HFA;VENTOLIN HFA) 108 (90 Base) MCG/ACT inhaler Inhale 2 puffs into the lungs every 4 (four) hours as needed for wheezing or shortness of breath. 1 Inhaler 0  . ARIPiprazole (ABILIFY) 15 MG tablet Take 0.5 tablets (7.5 mg total) by mouth daily. 15 tablet 3  . Ascorbic Acid (VITAMIN C) 100 MG tablet Take 100 mg by mouth daily.    Marland Kitchen aspirin 325 MG tablet Take 325 mg by mouth daily.    . Calcium Carbonate-Vitamin D3 (CALCIUM 600-D) 600-400 MG-UNIT TABS Take 1 tablet by mouth daily.     . Fenofibrate 150 MG CAPS Take 1 capsule (150 mg total) by mouth daily. 90 each 2  . fexofenadine (ALLEGRA) 180 MG tablet Take 180 mg by mouth.    Marland Kitchen glimepiride (AMARYL) 4 MG tablet Take 1 tablet (4 mg total) by mouth daily. 90 tablet 3  . glucose blood (ONE TOUCH TEST STRIPS) test  strip 1 each by Other route as needed for other. Use as instructed    . GuaiFENesin (MUCINEX PO) Take 1,200 mg by mouth 2 (two) times daily.    Marland Kitchen levothyroxine (SYNTHROID, LEVOTHROID) 50 MCG tablet TAKE ONE TABLET BY MOUTH ONCE DAILY BEFORE BREAKFAST 30 tablet 5  . lithium carbonate 300 MG capsule Take 4 capsules (1,200 mg total) by mouth daily. 120 capsule 3  . metFORMIN (GLUCOPHAGE) 1000 MG tablet Take 1 tablet (1,000 mg total) by mouth 2 (two) times daily. 180 tablet 2  . Multiple Vitamins-Minerals (CENTRUM SILVER) tablet Take 1 tablet by mouth daily.     . Omega-3 Fatty Acids (EQL FISH OIL PO) Take by mouth.    Letta Pate DELICA LANCETS 33G MISC Inject into the skin. Reported on 01/27/2016    . pseudoephedrine (SUDAFED) 120 MG 12 hr tablet     . ranitidine (ZANTAC) 150 MG tablet Take 150 mg by mouth 2 (two) times daily.    . sildenafil (VIAGRA) 100 MG tablet Take 1 tablet (100 mg total) by mouth daily as needed for erectile dysfunction. 16 tablet 5  . simvastatin (ZOCOR) 40 MG tablet Take 1 tablet (40 mg total) by mouth at bedtime. 90 tablet 2  . tretinoin (RETIN-A) 0.05 % cream APPLY  CREAM TOPICALLY TO AFFECTED AREA AS NEEDED 45 g 5   No current facility-administered medications on file prior to visit.     BP 140/74   Pulse 100   Temp 98.9 F (37.2 C) (Oral)   Ht 6\' 4"  (1.93 m)   Wt 225 lb 2 oz (102.1 kg)   SpO2 94%   BMI 27.40 kg/m    Objective:   Physical Exam  Constitutional: He is oriented to person, place, and time. He appears well-nourished.  Neck: Neck supple.  Cardiovascular: Normal rate and regular rhythm.   Pulmonary/Chest: Effort normal and breath sounds normal.  Neurological: He is alert and oriented to person, place, and time.  Skin: Skin is warm and dry.  Psychiatric: He has a normal mood and affect.          Assessment & Plan:

## 2017-03-21 NOTE — Assessment & Plan Note (Signed)
Recent TSH at 5. Normal free T4. We'll continue levothyroxine 50 g at this time, recheck labs in 1 month.

## 2017-04-04 ENCOUNTER — Other Ambulatory Visit: Payer: Self-pay | Admitting: Primary Care

## 2017-04-04 ENCOUNTER — Encounter: Payer: Self-pay | Admitting: Primary Care

## 2017-04-04 DIAGNOSIS — E039 Hypothyroidism, unspecified: Secondary | ICD-10-CM

## 2017-04-12 ENCOUNTER — Encounter (HOSPITAL_COMMUNITY): Payer: Self-pay | Admitting: Psychiatry

## 2017-04-12 ENCOUNTER — Ambulatory Visit (INDEPENDENT_AMBULATORY_CARE_PROVIDER_SITE_OTHER): Payer: PPO | Admitting: Psychiatry

## 2017-04-12 DIAGNOSIS — F3160 Bipolar disorder, current episode mixed, unspecified: Secondary | ICD-10-CM | POA: Diagnosis not present

## 2017-04-12 DIAGNOSIS — Z87891 Personal history of nicotine dependence: Secondary | ICD-10-CM | POA: Diagnosis not present

## 2017-04-12 DIAGNOSIS — Z818 Family history of other mental and behavioral disorders: Secondary | ICD-10-CM | POA: Diagnosis not present

## 2017-04-12 DIAGNOSIS — Z658 Other specified problems related to psychosocial circumstances: Secondary | ICD-10-CM

## 2017-04-12 DIAGNOSIS — G4700 Insomnia, unspecified: Secondary | ICD-10-CM

## 2017-04-12 DIAGNOSIS — F4010 Social phobia, unspecified: Secondary | ICD-10-CM | POA: Diagnosis not present

## 2017-04-12 DIAGNOSIS — R450 Nervousness: Secondary | ICD-10-CM

## 2017-04-12 DIAGNOSIS — F1211 Cannabis abuse, in remission: Secondary | ICD-10-CM

## 2017-04-12 DIAGNOSIS — F419 Anxiety disorder, unspecified: Secondary | ICD-10-CM

## 2017-04-12 MED ORDER — LITHIUM CARBONATE 300 MG PO CAPS: 900.0000 mg | ORAL_CAPSULE | Freq: Every day | ORAL | 1 refills | Status: DC

## 2017-04-12 MED ORDER — ARIPIPRAZOLE 15 MG PO TABS: 7.5000 mg | ORAL_TABLET | Freq: Every day | ORAL | 1 refills | Status: DC
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
## 2017-04-18 ENCOUNTER — Ambulatory Visit (HOSPITAL_COMMUNITY): Payer: PPO | Admitting: *Deleted

## 2017-04-18 ENCOUNTER — Other Ambulatory Visit (HOSPITAL_COMMUNITY): Payer: Self-pay | Admitting: Psychiatry

## 2017-04-18 VITALS — BP 126/78 | HR 85 | Temp 98.2°F | Resp 17 | Ht 75.5 in | Wt 227.0 lb

## 2017-04-18 DIAGNOSIS — F319 Bipolar disorder, unspecified: Secondary | ICD-10-CM

## 2017-04-18 NOTE — Progress Notes (Signed)
Presented for lab draw in no acute distress. Tolerated procedure well and offered no questions or concerns. Drew Lithium, lipid panel, cbc, cmp, prolactin, hga1c and TSH. He left in no acute distress

## 2017-04-19 LAB — COMPREHENSIVE METABOLIC PANEL
ALK PHOS: 87 IU/L (ref 39–117)
ALT: 40 IU/L (ref 0–44)
AST: 36 IU/L (ref 0–40)
Albumin/Globulin Ratio: 2.2 (ref 1.2–2.2)
Albumin: 4.7 g/dL (ref 3.5–5.5)
BUN / CREAT RATIO: 8 — AB (ref 9–20)
BUN: 9 mg/dL (ref 6–24)
Bilirubin Total: 0.4 mg/dL (ref 0.0–1.2)
CALCIUM: 10.1 mg/dL (ref 8.7–10.2)
CHLORIDE: 95 mmol/L — AB (ref 96–106)
CO2: 23 mmol/L (ref 20–29)
CREATININE: 1.07 mg/dL (ref 0.76–1.27)
GFR calc Af Amer: 90 mL/min/{1.73_m2} (ref >59)
GFR calc non Af Amer: 78 mL/min/{1.73_m2} (ref >59)
GLOBULIN, TOTAL: 2.1 g/dL (ref 1.5–4.5)
GLUCOSE: 306 mg/dL — AB (ref 65–99)
POTASSIUM: 4.2 mmol/L (ref 3.5–5.2)
SODIUM: 134 mmol/L (ref 134–144)
Total Protein: 6.8 g/dL (ref 6.0–8.5)

## 2017-04-19 LAB — TSH: TSH: 5.02 u[IU]/mL — AB (ref 0.450–4.500)

## 2017-04-19 LAB — CBC WITH DIFFERENTIAL/PLATELET
Basophils Absolute: 0 10*3/uL (ref 0.0–0.2)
Basos: 1 %
EOS (ABSOLUTE): 0.2 10*3/uL (ref 0.0–0.4)
Eos: 3 %
Hematocrit: 44.3 % (ref 37.5–51.0)
Hemoglobin: 15 g/dL (ref 13.0–17.7)
IMMATURE GRANS (ABS): 0 10*3/uL (ref 0.0–0.1)
IMMATURE GRANULOCYTES: 1 %
LYMPHS: 32 %
Lymphocytes Absolute: 2.2 10*3/uL (ref 0.7–3.1)
MCH: 30.1 pg (ref 26.6–33.0)
MCHC: 33.9 g/dL (ref 31.5–35.7)
MCV: 89 fL (ref 79–97)
MONOS ABS: 0.6 10*3/uL (ref 0.1–0.9)
Monocytes: 9 %
NEUTROS PCT: 54 %
Neutrophils Absolute: 3.8 10*3/uL (ref 1.4–7.0)
PLATELETS: 338 10*3/uL (ref 150–379)
RBC: 4.98 x10E6/uL (ref 4.14–5.80)
RDW: 12.6 % (ref 12.3–15.4)
WBC: 6.8 10*3/uL (ref 3.4–10.8)

## 2017-04-19 LAB — PROLACTIN: PROLACTIN: 6.4 ng/mL (ref 4.0–15.2)

## 2017-04-19 LAB — LITHIUM LEVEL: LITHIUM LVL: 0.7 mmol/L (ref 0.6–1.2)

## 2017-04-20 ENCOUNTER — Telehealth (HOSPITAL_COMMUNITY): Payer: Self-pay

## 2017-04-20 NOTE — Telephone Encounter (Signed)
Medication management - Telephone call with Dr. Michae Kava regarding patient's received labwork 04/19/17 and agreed to fax to his NP, Called [facility] to verify that is where Ms. Willie Spore, NP works and pt. is seen there. Labs faxed to her attention per Dr. Lemar Hughes request.

## 2017-04-23 ENCOUNTER — Encounter: Payer: Self-pay | Admitting: Primary Care

## 2017-04-23 ENCOUNTER — Ambulatory Visit (INDEPENDENT_AMBULATORY_CARE_PROVIDER_SITE_OTHER): Payer: PPO | Admitting: Primary Care

## 2017-04-23 VITALS — BP 130/74 | HR 99 | Temp 98.9°F | Ht 76.0 in | Wt 227.1 lb

## 2017-04-23 DIAGNOSIS — E032 Hypothyroidism due to medicaments and other exogenous substances: Secondary | ICD-10-CM | POA: Diagnosis not present

## 2017-04-23 DIAGNOSIS — E119 Type 2 diabetes mellitus without complications: Secondary | ICD-10-CM

## 2017-04-23 DIAGNOSIS — Z794 Long term (current) use of insulin: Secondary | ICD-10-CM | POA: Diagnosis not present

## 2017-04-23 NOTE — Assessment & Plan Note (Signed)
Due for repeat A1C, pending today.  Continue Lantus 25 units HS. Will likely add in Novolog 8 units TID before meals with glucose reading above 120. Will await A1C result first.

## 2017-04-23 NOTE — Assessment & Plan Note (Signed)
TSH last week about the same. Given recent reduction in Lithium dose, will continue levothyroxine at 50 mcg and repeat TSH in 4 weeks.

## 2017-04-23 NOTE — Progress Notes (Signed)
Subjective:    Patient ID: Willie Hughes, male    DOB: 1960/12/05, 56 y.o.   MRN: [id number]  HPI  Mr. Longmore is a 56 year old male who presents today for follow up of diabetes and hypothyroidism.  Current medications include: Lantus 25 units for which he takes at lunch, glimepiride 4 mg once daily, metformin 1000 mg BID. He's trying to improve his diet but is on a limited budget and is restricted to eating pasta and cheaper/processed foods.  He is checking his  blood glucose 1-3 times daily sporadically and is getting readings of: Fasting in the morning: 105 once,194, 150, 243 1-2 hours after meals: 295, 259, 264, 364, 172, 262 Before Dinner: 307, 315 Bedtime: 199  Last A1C: 10.6 in July 2012 Last Eye Exam: Completed in April 2018 Last Foot Exam: Due in January 2019 Pneumonia Vaccination: Up to date, due again in 2019. ACE/ARB: None Statin: Simvastatin   2) Hypothyroidism: Medication induced from Lithium. Currently managed on levothyroxine 50 mcg. TSH of 5.02 in late September 2018, 5.08 in mid August 2018, TSH of 5.43 in mid July 2018. He had a dose reduction in his Lithium from 1200 mg to 900 mg last week due to tremor.      Review of Systems  Eyes: Negative for visual disturbance.  Respiratory: Negative for shortness of breath.   Cardiovascular: Negative for chest pain.  Neurological: Negative for dizziness and headaches.       Tremors on Lithium       Past Medical History:  Diagnosis Date  . Acne   . Allergic rhinitis   . Bipolar disorder (HCC)   . Erectile dysfunction   . Hyperlipidemia   . Hypothyroidism   . Type 2 diabetes mellitus (HCC)      Social History   Social History  . Marital status: Single    Spouse name: N/A  . Number of children: 0  . Years of education: N/A   Occupational History  . Not on file.   Social History Main Topics  . Smoking status: Former Smoker    Types: E-cigarettes  . Smokeless tobacco: Never Used     Comment: Vapor  .  Alcohol use 0.0 oz/week     Comment: a few beers a month  . Drug use: Yes    Types: Marijuana     Comment: over 39yr ago was last use THC  . Sexual activity: Not on file   Other Topics Concern  . Not on file   Social History Narrative   Single and has one cat. Living in [place]. Never married. No kids.    Born and raised in [place] by parents. 2 sisters and 1 brother and pt is the youngest. Pt has 2 associates degrees in Sports administrator   Disabled for Bipolar disorder.  Last worked 2008    Enjoys watching TV, playing on the Internet.    Past Surgical History:  Procedure Laterality Date  . WISDOM TOOTH EXTRACTION      Family History  Problem Relation Age of Onset  . Heart attack Father   . Stroke Father   . Stroke Mother   . Diabetes Mother   . Depression Mother   . Depression Sister   . Bipolar disorder Paternal Aunt   . Lupus Sister     Allergies  Allergen Reactions  . Kava Kava     Current Outpatient Prescriptions on File Prior to Visit  Medication Sig Dispense Refill  .  acetaminophen (TYLENOL) 500 MG tablet Take 1,000 mg by mouth 3 (three) times daily.     Marland Kitchen albuterol (PROVENTIL HFA;VENTOLIN HFA) 108 (90 Base) MCG/ACT inhaler Inhale 2 puffs into the lungs every 4 (four) hours as needed for wheezing or shortness of breath. 1 Inhaler 0  . ARIPiprazole (ABILIFY) 15 MG tablet Take 0.5 tablets (7.5 mg total) by mouth daily. 15 tablet 1  . aspirin 325 MG tablet Take 325 mg by mouth daily.    . Calcium Carbonate-Vitamin D3 (CALCIUM 600-D) 600-400 MG-UNIT TABS Take 1 tablet by mouth daily.     . Fenofibrate 150 MG CAPS Take 1 capsule (150 mg total) by mouth daily. 90 each 2  . fexofenadine (ALLEGRA) 180 MG tablet Take 180 mg by mouth 2 (two) times daily.     Marland Kitchen glimepiride (AMARYL) 4 MG tablet Take 1 tablet (4 mg total) by mouth daily. 90 tablet 3  . glucose blood (ONE TOUCH TEST STRIPS) test strip 1 each by Other route as needed for other. Use as instructed    .  GuaiFENesin (MUCINEX PO) Take 1,200 mg by mouth 2 (two) times daily.    . Insulin Glargine (LANTUS) 100 UNIT/ML Solostar Pen Inject 25 Units into the skin every evening. 15 mL 11  . Insulin Pen Needle (COMFORT EZ PEN NEEDLES) 31G X 6 MM MISC Use with Lantus Insulin once every evening. 100 each 11  . levothyroxine (SYNTHROID, LEVOTHROID) 50 MCG tablet TAKE ONE TABLET BY MOUTH DAILY ON AN EMPTY STOMACH BEFORE BREAKFAST 90 tablet 1  . lithium carbonate 300 MG capsule Take 3 capsules (900 mg total) by mouth daily. 90 capsule 1  . metFORMIN (GLUCOPHAGE) 1000 MG tablet Take 1 tablet (1,000 mg total) by mouth 2 (two) times daily. 180 tablet 2  . Multiple Vitamins-Minerals (CENTRUM SILVER) tablet Take 1 tablet by mouth daily.     . Omega-3 Fatty Acids (EQL FISH OIL PO) Take 1,000 mg by mouth 3 (three) times daily.     Letta Pate DELICA LANCETS 33G MISC Inject into the skin. Reported on 01/27/2016    . pseudoephedrine (SUDAFED) 120 MG 12 hr tablet Take 120 mg by mouth 2 (two) times daily.     . ranitidine (ZANTAC) 150 MG tablet Take 150 mg by mouth 2 (two) times daily.    . sildenafil (VIAGRA) 100 MG tablet Take 1 tablet (100 mg total) by mouth daily as needed for erectile dysfunction. 16 tablet 5  . simvastatin (ZOCOR) 40 MG tablet Take 1 tablet (40 mg total) by mouth at bedtime. 90 tablet 2  . tretinoin (RETIN-A) 0.05 % cream APPLY  CREAM TOPICALLY TO AFFECTED AREA AS NEEDED 45 g 5   No current facility-administered medications on file prior to visit.     BP 130/74   Pulse 99   Temp 98.9 F (37.2 C) (Oral)   Ht  (1.93 m)   Wt 227 lb 1.9 oz (103 kg)   SpO2 96%   BMI 27.65 kg/m    Objective:   Physical Exam  Constitutional: He appears well-nourished.  Neck: Neck supple.  Cardiovascular: Normal rate and regular rhythm.   Pulmonary/Chest: Effort normal and breath sounds normal.  Skin: Skin is warm and dry.  Psychiatric: He has a normal mood and affect.          Assessment & Plan:

## 2017-04-23 NOTE — Patient Instructions (Signed)
I will be in touch once I receive your A1C results.   Schedule a follow up visit in 4 weeks.  It was a pleasure to see you today!  Diabetes Mellitus and Food It is important for you to manage your blood sugar (glucose) level. Your blood glucose level can be greatly affected by what you eat. Eating healthier foods in the appropriate amounts throughout the day at about the same time each day will help you control your blood glucose level. It can also help slow or prevent worsening of your diabetes mellitus. Healthy eating may even help you improve the level of your blood pressure and reach or maintain a healthy weight. General recommendations for healthful eating and cooking habits include:  Eating meals and snacks regularly. Avoid going long periods of time without eating to lose weight.  Eating a diet that consists mainly of plant-based foods, such as fruits, vegetables, nuts, legumes, and whole grains.  Using low-heat cooking methods, such as baking, instead of high-heat cooking methods, such as deep frying.  Work with your dietitian to make sure you understand how to use the Nutrition Facts information on food labels. How can food affect me? Carbohydrates Carbohydrates affect your blood glucose level more than any other type of food. Your dietitian will help you determine how many carbohydrates to eat at each meal and teach you how to count carbohydrates. Counting carbohydrates is important to keep your blood glucose at a healthy level, especially if you are using insulin or taking certain medicines for diabetes mellitus. Alcohol Alcohol can cause sudden decreases in blood glucose (hypoglycemia), especially if you use insulin or take certain medicines for diabetes mellitus. Hypoglycemia can be a life-threatening condition. Symptoms of hypoglycemia (sleepiness, dizziness, and disorientation) are similar to symptoms of having too much alcohol. If your health care provider has given you approval  to drink alcohol, do so in moderation and use the following guidelines:  Women should not have more than one drink per day, and men should not have more than two drinks per day. One drink is equal to: ? 12 oz of beer. ? 5 oz of wine. ? 1 oz of hard liquor.  Do not drink on an empty stomach.  Keep yourself hydrated. Have water, diet soda, or unsweetened iced tea.  Regular soda, juice, and other mixers might contain a lot of carbohydrates and should be counted.  What foods are not recommended? As you make food choices, it is important to remember that all foods are not the same. Some foods have fewer nutrients per serving than other foods, even though they might have the same number of calories or carbohydrates. It is difficult to get your body what it needs when you eat foods with fewer nutrients. Examples of foods that you should avoid that are high in calories and carbohydrates but low in nutrients include:  Trans fats (most processed foods list trans fats on the Nutrition Facts label).  Regular soda.  Juice.  Candy.  Sweets, such as cake, pie, doughnuts, and cookies.  Fried foods.  What foods can I eat? Eat nutrient-rich foods, which will nourish your body and keep you healthy. The food you should eat also will depend on several factors, including:  The calories you need.  The medicines you take.  Your weight.  Your blood glucose level.  Your blood pressure level.  Your cholesterol level.  You should eat a variety of foods, including:  Protein. ? Lean cuts of meat. ? Proteins low  in saturated fats, such as fish, egg whites, and beans. Avoid processed meats.  Fruits and vegetables. ? Fruits and vegetables that may help control blood glucose levels, such as apples, mangoes, and yams.  Dairy products. ? Choose fat-free or low-fat dairy products, such as milk, yogurt, and cheese.  Grains, bread, pasta, and rice. ? Choose whole grain products, such as multigrain  bread, whole oats, and brown rice. These foods may help control blood pressure.  Fats. ? Foods containing healthful fats, such as nuts, avocado, olive oil, canola oil, and fish.  Does everyone with diabetes mellitus have the same meal plan? Because every person with diabetes mellitus is different, there is not one meal plan that works for everyone. It is very important that you meet with a dietitian who will help you create a meal plan that is just right for you. This information is not intended to replace advice given to you by your health care provider. Make sure you discuss any questions you have with your health care provider. Document Released: 04/06/2005 Document Revised: 12/16/2015 Document Reviewed: 06/06/2013 Elsevier Interactive Patient Education  2017 [employer].

## 2017-04-24 ENCOUNTER — Other Ambulatory Visit: Payer: Self-pay | Admitting: Primary Care

## 2017-04-24 DIAGNOSIS — E785 Hyperlipidemia, unspecified: Secondary | ICD-10-CM

## 2017-04-24 DIAGNOSIS — Z794 Long term (current) use of insulin: Principal | ICD-10-CM

## 2017-04-24 DIAGNOSIS — E119 Type 2 diabetes mellitus without complications: Secondary | ICD-10-CM

## 2017-04-24 LAB — HEMOGLOBIN A1C: HEMOGLOBIN A1C: 10.3 % — AB (ref 4.6–6.5)

## 2017-04-24 MED ORDER — FENOFIBRATE 150 MG PO CAPS: 150.0000 mg | ORAL_CAPSULE | Freq: Every day | ORAL | 3 refills | Status: DC

## 2017-04-24 MED ORDER — INSULIN ASPART 100 UNIT/ML FLEXPEN: PEN_INJECTOR | SUBCUTANEOUS | 2 refills | Status: DC

## 2017-04-24 NOTE — Telephone Encounter (Signed)
Received faxed refill request for Fenofibrate 150 MG   Last prescribed on 07/11/2016. Last seen on 04/23/2017

## 2017-04-24 NOTE — Telephone Encounter (Signed)
Refills sent to pharmacy. 

## 2017-04-25 ENCOUNTER — Encounter: Payer: Self-pay | Admitting: Primary Care

## 2017-04-26 ENCOUNTER — Encounter: Payer: Self-pay | Admitting: Primary Care

## 2017-04-26 MED ORDER — GLUCOSE BLOOD VI STRP: ORAL_STRIP | 2 refills | Status: DC

## 2017-05-10 ENCOUNTER — Ambulatory Visit (INDEPENDENT_AMBULATORY_CARE_PROVIDER_SITE_OTHER): Payer: PPO | Admitting: Primary Care

## 2017-05-10 ENCOUNTER — Encounter: Payer: Self-pay | Admitting: Primary Care

## 2017-05-10 VITALS — BP 126/74 | HR 99 | Temp 98.4°F | Ht 76.0 in | Wt 233.8 lb

## 2017-05-10 DIAGNOSIS — H6121 Impacted cerumen, right ear: Secondary | ICD-10-CM | POA: Diagnosis not present

## 2017-05-10 DIAGNOSIS — H9201 Otalgia, right ear: Secondary | ICD-10-CM

## 2017-05-10 NOTE — Progress Notes (Signed)
Subjective:    Patient ID: Willie Hughes, male    DOB: [birth year], 56 y.o.   MRN: [id number]  HPI  Willie Hughes is a 56 year old male with a history of chronic allergic rhinitis and chronic headaches who presents today with a chief complaint of ear pain. His pain is located to the right ear that began 4-5 days ago, increased pain last night that improved after getting out of bed.  He is currently taking Allegra 180, Mucinex, and Sudafed. He's been attempting to clean out his ear but has been unsuccessful. He thinks that there is water trapped in the ear. He denies fevers, sore throat, cough.   Review of Systems  Constitutional: Negative for fever.  HENT: Positive for ear pain. Negative for sore throat.   Respiratory: Negative for cough.        Past Medical History:  Diagnosis Date  . Acne   . Allergic rhinitis   . Bipolar disorder (HCC)   . Erectile dysfunction   . Hyperlipidemia   . Hypothyroidism   . Type 2 diabetes mellitus (HCC)      Social History   Social History  . Marital status: Single    Spouse name: N/A  . Number of children: 0  . Years of education: N/A   Occupational History  . Not on file.   Social History Main Topics  . Smoking status: Former Smoker    Types: E-cigarettes  . Smokeless tobacco: Never Used     Comment: Vapor  . Alcohol use 0.0 oz/week     Comment: a few beers a month  . Drug use: Yes    Types: Marijuana     Comment: over 57yr ago was last use THC  . Sexual activity: Not on file   Other Topics Concern  . Not on file   Social History Narrative   Single and has one cat. Living in [place]. Never married. No kids.    Born and raised in [place] by parents. 2 sisters and 1 brother and pt is the youngest. Pt has 2 associates degrees in Sports administrator   Disabled for Bipolar disorder.  Last worked 2008    Enjoys watching TV, playing on the Internet.    Past Surgical History:  Procedure Laterality Date  . WISDOM TOOTH EXTRACTION       Family History  Problem Relation Age of Onset  . Heart attack Father   . Stroke Father   . Stroke Mother   . Diabetes Mother   . Depression Mother   . Depression Sister   . Bipolar disorder Paternal Aunt   . Lupus Sister     Allergies  Allergen Reactions  . Kava Kava     Current Outpatient Prescriptions on File Prior to Visit  Medication Sig Dispense Refill  . acetaminophen (TYLENOL) 500 MG tablet Take 1,000 mg by mouth 3 (three) times daily.     Marland Kitchen albuterol (PROVENTIL HFA;VENTOLIN HFA) 108 (90 Base) MCG/ACT inhaler Inhale 2 puffs into the lungs every 4 (four) hours as needed for wheezing or shortness of breath. 1 Inhaler 0  . ARIPiprazole (ABILIFY) 15 MG tablet Take 0.5 tablets (7.5 mg total) by mouth daily. 15 tablet 1  . Ascorbic Acid (VITAMIN C) 1000 MG tablet Take 1,000 mg by mouth daily.    Marland Kitchen aspirin 325 MG tablet Take 325 mg by mouth daily.    . Calcium Carbonate-Vitamin D3 (CALCIUM 600-D) 600-400 MG-UNIT TABS Take 1 tablet by mouth  daily.     . Fenofibrate 150 MG CAPS Take 1 capsule (150 mg total) by mouth daily. 90 each 3  . fexofenadine (ALLEGRA) 180 MG tablet Take 180 mg by mouth 2 (two) times daily.     Marland Kitchen. glimepiride (AMARYL) 4 MG tablet Take 1 tablet (4 mg total) by mouth daily. 90 tablet 3  . glucose blood (ONE TOUCH TEST STRIPS) test strip Use as instructed to test blood sugar 3 times daily 300 each 2  . GuaiFENesin (MUCINEX PO) Take 1,200 mg by mouth 2 (two) times daily.    . insulin aspart (NOVOLOG FLEXPEN) 100 UNIT/ML FlexPen Inject 8 units into the skin three times daily before meals for blood sugars above 120. 15 mL 2  . Insulin Glargine (LANTUS) 100 UNIT/ML Solostar Pen Inject 25 Units into the skin every evening. 15 mL 11  . Insulin Pen Needle (COMFORT EZ PEN NEEDLES) 31G X 6 MM MISC Use with Lantus Insulin once every evening. 100 each 11  . levothyroxine (SYNTHROID, LEVOTHROID) 50 MCG tablet TAKE ONE TABLET BY MOUTH DAILY ON AN EMPTY STOMACH BEFORE  BREAKFAST 90 tablet 1  . lithium carbonate 300 MG capsule Take 3 capsules (900 mg total) by mouth daily. 90 capsule 1  . metFORMIN (GLUCOPHAGE) 1000 MG tablet Take 1 tablet (1,000 mg total) by mouth 2 (two) times daily. 180 tablet 2  . Multiple Vitamins-Minerals (CENTRUM SILVER) tablet Take 1 tablet by mouth daily.     . Omega-3 Fatty Acids (EQL FISH OIL PO) Take 1,000 mg by mouth 3 (three) times daily.     Letta Pate. ONETOUCH DELICA LANCETS 33G MISC Inject into the skin. Reported on 01/27/2016    . pseudoephedrine (SUDAFED) 120 MG 12 hr tablet Take 120 mg by mouth 2 (two) times daily.     . ranitidine (ZANTAC) 150 MG tablet Take 150 mg by mouth 2 (two) times daily.    . sildenafil (VIAGRA) 100 MG tablet Take 1 tablet (100 mg total) by mouth daily as needed for erectile dysfunction. 16 tablet 5  . simvastatin (ZOCOR) 40 MG tablet Take 1 tablet (40 mg total) by mouth at bedtime. 90 tablet 2  . tretinoin (RETIN-A) 0.05 % cream APPLY  CREAM TOPICALLY TO AFFECTED AREA AS NEEDED 45 g 5   No current facility-administered medications on file prior to visit.     BP 126/74   Pulse 99   Temp 98.4 F (36.9 C) (Oral)   Ht 6\' 4"  (1.93 m)   Wt 233 lb 12.8 oz (106.1 kg)   SpO2 94%   BMI 28.46 kg/m    Objective:   Physical Exam  Constitutional: He appears well-nourished.  HENT:  Right Ear: No decreased hearing is noted.  Nose: No mucosal edema. Right sinus exhibits no maxillary sinus tenderness and no frontal sinus tenderness. Left sinus exhibits no maxillary sinus tenderness and no frontal sinus tenderness.  Mouth/Throat: Oropharynx is clear and moist.  Right canal with cerumen impaction. Left canal and TM unremarkable. Right canal unremarkable post irrigation, skin of right canal with mild erythema/irritation, no infection.  Eyes: Conjunctivae are normal.  Neck: Neck supple.  Cardiovascular: Normal rate and regular rhythm.   Pulmonary/Chest: Effort normal and breath sounds normal. He has no wheezes. He  has no rales.  Skin: Skin is warm and dry.          Assessment & Plan:  Otalgia:  Located to right ear x 3-4 days. Right canal with cerumen impaction, left canal and  TM unremarkable. Right TM without infection. Right canal with irritation to the skin, no infection. Discussed the use of Debrox drops to prevent future impactions. Follow up PRN.  Morrie Sheldon, NP

## 2017-05-10 NOTE — Patient Instructions (Signed)
You can use Debrox drops in the future for further wax buildup.  It was a pleasure to see you today!   Earwax Buildup, Adult The ears produce a substance called earwax that helps keep bacteria out of the ear and protects the skin in the ear canal. Occasionally, earwax can build up in the ear and cause discomfort or hearing loss. What increases the risk? This condition is more likely to develop in people who:  Are male.  Are elderly.  Naturally produce more earwax.  Clean their ears often with cotton swabs.  Use earplugs often.  Use in-ear headphones often.  Wear hearing aids.  Have narrow ear canals.  Have earwax that is overly thick or sticky.  Have eczema.  Are dehydrated.  Have excess hair in the ear canal.  What are the signs or symptoms? Symptoms of this condition include:  Reduced or muffled hearing.  A feeling of fullness in the ear or feeling that the ear is plugged.  Fluid coming from the ear.  Ear pain.  Ear itch.  Ringing in the ear.  Coughing.  An obvious piece of earwax that can be seen inside the ear canal.  How is this diagnosed? This condition may be diagnosed based on:  Your symptoms.  Your medical history.  An ear exam. During the exam, your health care provider will look into your ear with an instrument called an otoscope.  You may have tests, including a hearing test. How is this treated? This condition may be treated by:  Using ear drops to soften the earwax.  Having the earwax removed by a health care provider. The health care provider may: ? Flush the ear with water. ? Use an instrument that has a loop on the end (curette). ? Use a suction device.  Surgery to remove the wax buildup. This may be done in severe cases.  Follow these instructions at home:  Take over-the-counter and prescription medicines only as told by your health care provider.  Do not put any objects, including cotton swabs, into your ear. You can  clean the opening of your ear canal with a washcloth or facial tissue.  Follow instructions from your health care provider about cleaning your ears. Do not over-clean your ears.  Drink enough fluid to keep your urine clear or pale yellow. This will help to thin the earwax.  Keep all follow-up visits as told by your health care provider. If earwax builds up in your ears often or if you use hearing aids, consider seeing your health care provider for routine, preventive ear cleanings. Ask your health care provider how often you should schedule your cleanings.  If you have hearing aids, clean them according to instructions from the manufacturer and your health care provider. Contact a health care provider if:  You have ear pain.  You develop a fever.  You have blood, pus, or other fluid coming from your ear.  You have hearing loss.  You have ringing in your ears that does not go away.  Your symptoms do not improve with treatment.  You feel like the room is spinning (vertigo). Summary  Earwax can build up in the ear and cause discomfort or hearing loss.  The most common symptoms of this condition include reduced or muffled hearing and a feeling of fullness in the ear or feeling that the ear is plugged.  This condition may be diagnosed based on your symptoms, your medical history, and an ear exam.  This condition may  be treated by using ear drops to soften the earwax or by having the earwax removed by a health care provider.  Do not put any objects, including cotton swabs, into your ear. You can clean the opening of your ear canal with a washcloth or facial tissue. This information is not intended to replace advice given to you by your health care provider. Make sure you discuss any questions you have with your health care provider. Document Released: 08/17/2004 Document Revised: 09/20/2016 Document Reviewed: 09/20/2016 Elsevier Interactive Patient Education  [employer]2018 [employer]

## 2017-05-12 ENCOUNTER — Emergency Department
Admission: EM | Admit: 2017-05-12 | Discharge: 2017-05-12 | Disposition: A | Discharge status: Home / Self Care | Payer: PPO | Source: Home / Self Care | Attending: Family Medicine | Admitting: Family Medicine

## 2017-05-12 ENCOUNTER — Encounter: Payer: Self-pay | Admitting: Emergency Medicine

## 2017-05-12 DIAGNOSIS — H6691 Otitis media, unspecified, right ear: Secondary | ICD-10-CM

## 2017-05-12 DIAGNOSIS — H60501 Unspecified acute noninfective otitis externa, right ear: Secondary | ICD-10-CM | POA: Diagnosis not present

## 2017-05-12 MED ORDER — AMOXICILLIN-POT CLAVULANATE 875-125 MG PO TABS: 1.0000 | ORAL_TABLET | Freq: Two times a day (BID) | ORAL | 0 refills | Status: DC

## 2017-05-12 MED ORDER — NEOMYCIN-POLYMYXIN-HC 3.5-10000-1 OT SUSP: 4.0000 [drp] | Freq: Three times a day (TID) | OTIC | 0 refills | Status: DC

## 2017-05-12 NOTE — Discharge Instructions (Signed)
°  You may take 500mg acetaminophen every 4-6 hours or in combination with ibuprofen 400-600mg every 6-8 hours as needed for pain, inflammation, and fever. ° °Be sure to drink at least eight 8oz glasses of water to stay well hydrated and get at least 8 hours of sleep at night, preferably more while sick.  ° °

## 2017-05-12 NOTE — ED Provider Notes (Signed)
[facility]    CSN: [id number] Arrival date & time: 05/12/17  1735     History   Chief Complaint Chief Complaint  Patient presents with  . Otalgia    HPI Willie Hughes is a 56 y.o. male.   HPI  Willie Hughes is a 56 y.o. male presenting to UC with c/o Right ear pain after having wax removed 2 days ago by his nurse practitioner.  Ear is tender to touch at times. He has had wax removed from his ears in the past but does not recall having pain after the procedure. He reports having chronic nasal congestion and mild intermittent cough due to allergies but no worse than usual for him. Denies fever, chills, n/v/d. Denies sore throat but he does have a mild Right sided headache intermittently.    Past Medical History:  Diagnosis Date  . Acne   . Allergic rhinitis   . Bipolar disorder (HCC)   . Erectile dysfunction   . Hyperlipidemia   . Hypothyroidism   . Type 2 diabetes mellitus [facility])     Patient Active Problem List   Diagnosis Date Noted  . History of tobacco abuse 02/01/2017  . Arthritis 12/20/2016  . Medicare annual wellness visit, subsequent 10/11/2016  . Chronic allergic rhinitis 09/20/2016  . Chronic headaches 09/20/2016  . Hypothyroidism 01/28/2016  . Hyperlipidemia 12/15/2015  . Erectile dysfunction 12/15/2015  . Type 2 diabetes mellitus (HCC) 12/15/2015  . Acne 12/15/2015  . Bipolar disorder (HCC) 12/15/2015    Past Surgical History:  Procedure Laterality Date  . WISDOM TOOTH EXTRACTION         Home Medications    Prior to Admission medications   Medication Sig Start Date End Date Taking? Authorizing Provider  acetaminophen (TYLENOL) 500 MG tablet Take 1,000 mg by mouth 3 (three) times daily.     [provider]  albuterol (PROVENTIL HFA;VENTOLIN HFA) 108 (90 Base) MCG/ACT inhaler Inhale 2 puffs into the lungs every 4 (four) hours as needed for wheezing or shortness of breath. 02/02/17   Doreene Nest, NP  amoxicillin-clavulanate  (AUGMENTIN) 875-125 MG tablet Take 1 tablet by mouth 2 (two) times daily. One po bid x 7 days 05/12/17   Lurene Shadow, PA-C  ARIPiprazole (ABILIFY) 15 MG tablet Take 0.5 tablets (7.5 mg total) by mouth daily. 04/12/17   Oletta Darter, MD  Ascorbic Acid (VITAMIN C) 1000 MG tablet Take 1,000 mg by mouth daily.    [provider]  aspirin 325 MG tablet Take 325 mg by mouth daily.    [provider]  Calcium Carbonate-Vitamin D3 (CALCIUM 600-D) 600-400 MG-UNIT TABS Take 1 tablet by mouth daily.  09/09/13   [provider]  Fenofibrate 150 MG CAPS Take 1 capsule (150 mg total) by mouth daily. 04/24/17   Doreene Nest, NP  fexofenadine (ALLEGRA) 180 MG tablet Take 180 mg by mouth 2 (two) times daily.     [provider]  glimepiride (AMARYL) 4 MG tablet Take 1 tablet (4 mg total) by mouth daily. 09/06/16   Doreene Nest, NP  glucose blood (ONE TOUCH TEST STRIPS) test strip Use as instructed to test blood sugar 3 times daily 04/26/17   Doreene Nest, NP  GuaiFENesin (MUCINEX PO) Take 1,200 mg by mouth 2 (two) times daily.    [provider]  insulin aspart (NOVOLOG FLEXPEN) 100 UNIT/ML FlexPen Inject 8 units into the skin three times daily before meals for blood sugars above 120.  04/24/17   Doreene Nestlark, Katherine K, NP  Insulin Glargine (LANTUS) 100 UNIT/ML Solostar Pen Inject 25 Units into the skin every evening. 03/21/17   Doreene Nestlark, Katherine K, NP  Insulin Pen Needle (COMFORT EZ PEN NEEDLES) 31G X 6 MM MISC Use with Lantus Insulin once every evening. 03/21/17   Doreene Nestlark, Katherine K, NP  levothyroxine (SYNTHROID, LEVOTHROID) 50 MCG tablet TAKE ONE TABLET BY MOUTH DAILY ON AN EMPTY STOMACH BEFORE BREAKFAST 04/04/17   Doreene Nestlark, Katherine K, NP  lithium carbonate 300 MG capsule Take 3 capsules (900 mg total) by mouth daily. 04/12/17   Oletta DarterAgarwal, [place], MD  metFORMIN (GLUCOPHAGE) 1000 MG tablet Take 1 tablet (1,000 mg total) by mouth 2 (two) times daily. 07/11/16   Doreene Nestlark,  Katherine K, NP  Multiple Vitamins-Minerals (CENTRUM SILVER) tablet Take 1 tablet by mouth daily.  09/09/13   [provider]  neomycin-polymyxin-hydrocortisone (CORTISPORIN) 3.5-10000-1 OTIC suspension Place 4 drops into the right ear 3 (three) times daily. For 7 days 05/12/17   Lurene ShadowPhelps, Camellia Popescu O, PA-C  Omega-3 Fatty Acids (EQL FISH OIL PO) Take 1,000 mg by mouth 3 (three) times daily.     [provider]  [facility] 33G MISC Inject into the skin. Reported on 01/27/2016 10/25/15   [provider]  pseudoephedrine (SUDAFED) 120 MG 12 hr tablet Take 120 mg by mouth 2 (two) times daily.  09/09/13   [provider]  ranitidine (ZANTAC) 150 MG tablet Take 150 mg by mouth 2 (two) times daily.    [provider]  sildenafil (VIAGRA) 100 MG tablet Take 1 tablet (100 mg total) by mouth daily as needed for erectile dysfunction. 12/01/16   Doreene Nestlark, Katherine K, NP  simvastatin (ZOCOR) 40 MG tablet Take 1 tablet (40 mg total) by mouth at bedtime. 07/11/16 07/11/17  Doreene Nestlark, Katherine K, NP  tretinoin (RETIN-A) 0.05 % cream APPLY  CREAM TOPICALLY TO AFFECTED AREA AS NEEDED 02/07/16   Doreene Nestlark, Katherine K, NP    Family History Family History  Problem Relation Age of Onset  . Heart attack Father   . Stroke Father   . Stroke Mother   . Diabetes Mother   . Depression Mother   . Depression Sister   . Bipolar disorder Paternal Aunt   . Lupus Sister     Social History Social History  Substance Use Topics  . Smoking status: Former Smoker    Types: E-cigarettes  . Smokeless tobacco: Never Used     Comment: Vapor  . Alcohol use 0.0 oz/week     Comment: a few beers a month     Allergies   Kava kava   Review of Systems Review of Systems  Constitutional: Negative for chills and fever.  HENT: Positive for congestion, ear pain (Right), hearing loss (muffled in Right), postnasal drip and rhinorrhea. Negative for sore throat, trouble swallowing and voice  change.   Respiratory: Positive for cough ( mild intermittent, chronic). Negative for shortness of breath.   Cardiovascular: Negative for chest pain and palpitations.  Gastrointestinal: Negative for abdominal pain, diarrhea, nausea and vomiting.  Musculoskeletal: Negative for arthralgias, back pain and myalgias.  Skin: Negative for rash.  Neurological: Positive for headaches. Negative for dizziness and light-headedness.     Physical Exam Triage Vital Signs ED Triage Vitals  Enc Vitals Group     BP 05/12/17 1746 125/77     Pulse Rate 05/12/17 1746 83     Resp --      Temp 05/12/17 1746 99 F (  37.2 C)     Temp Source 05/12/17 1746 Oral     SpO2 05/12/17 1746 96 %     Weight 05/12/17 1747 227 lb (103 kg)     Height 05/12/17 1747 6\' 4"  (1.93 m)     Head Circumference --      Peak Flow --      Pain Score 05/12/17 1747 2     Pain Loc --      Pain Edu? --      Excl. in GC? --    No data found.   Updated Vital Signs BP 125/77 (BP Location: Left Arm)   Pulse 83   Temp 99 F (37.2 C) (Oral)   Ht 6\' 4"  (1.93 m)   Wt 227 lb (103 kg)   SpO2 96%   BMI 27.63 kg/m   Visual Acuity Right Eye Distance:   Left Eye Distance:   Bilateral Distance:    Right Eye Near:   Left Eye Near:    Bilateral Near:     Physical Exam  Constitutional: He is oriented to person, place, and time. He appears well-developed and well-nourished. No distress.  HENT:  Head: Normocephalic and atraumatic.  Right Ear: There is swelling ( mild with erythema in canal ) and tenderness. Tympanic membrane is erythematous. Tympanic membrane is not perforated and not bulging. No middle ear effusion.  Left Ear: Tympanic membrane normal.  Nose: Nose normal. Right sinus exhibits no maxillary sinus tenderness and no frontal sinus tenderness. Left sinus exhibits no maxillary sinus tenderness and no frontal sinus tenderness.  Mouth/Throat: Uvula is midline, oropharynx is clear and moist and mucous membranes are normal.   Eyes: EOM are normal.  Neck: Normal range of motion. Neck supple.  Cardiovascular: Normal rate and regular rhythm.   Pulmonary/Chest: Effort normal and breath sounds normal. No stridor. No respiratory distress. He has no wheezes. He has no rales.  Musculoskeletal: Normal range of motion.  Lymphadenopathy:    He has no cervical adenopathy.  Neurological: He is alert and oriented to person, place, and time.  Skin: Skin is warm and dry. He is not diaphoretic.  Psychiatric: He has a normal mood and affect. His behavior is normal.  Nursing note and vitals reviewed.    UC Treatments / Results  Labs (all labs ordered are listed, but only abnormal results are displayed) Labs Reviewed - No data to display  EKG  EKG Interpretation None       Radiology No results found.  Procedures Procedures (including critical care time)  Medications Ordered in UC Medications - No data to display   Initial Impression / Assessment and Plan / UC Course  I have reviewed the triage vital signs and the nursing notes.  Pertinent labs & imaging results that were available during my care of the patient were reviewed by me and considered in my medical decision making (see chart for details).     Hx and exam c/w Right otitis externa and AOM Will treat with otic drops- Cortisporin and oral Augmentin  Encouraged to take acetaminophen and ibuprofen as needed for pain and fever F/u with PCP in 1 week if not improving.   Final Clinical Impressions(s) / UC Diagnoses   Final diagnoses:  Acute otitis externa of right ear, unspecified type  Right acute otitis media    New Prescriptions Discharge Medication List as of 05/12/2017  5:51 PM    Augmentin  Cortisporin   Controlled Substance Prescriptions [place] Controlled Substance Registry consulted?  Not Applicable   Rolla Plate 05/13/17 1122

## 2017-05-12 NOTE — ED Triage Notes (Signed)
Pt complaining of right ear pain after having wax removed 2 days ago out of right ear.  Ear is tender to touch.

## 2017-05-21 ENCOUNTER — Encounter: Payer: Self-pay | Admitting: Primary Care

## 2017-05-21 ENCOUNTER — Ambulatory Visit (INDEPENDENT_AMBULATORY_CARE_PROVIDER_SITE_OTHER): Payer: PPO | Admitting: Primary Care

## 2017-05-21 VITALS — BP 126/78 | HR 100 | Temp 98.2°F | Ht 76.0 in | Wt 230.0 lb

## 2017-05-21 DIAGNOSIS — Z23 Encounter for immunization: Secondary | ICD-10-CM | POA: Diagnosis not present

## 2017-05-21 DIAGNOSIS — E118 Type 2 diabetes mellitus with unspecified complications: Secondary | ICD-10-CM

## 2017-05-21 DIAGNOSIS — Z794 Long term (current) use of insulin: Secondary | ICD-10-CM

## 2017-05-21 NOTE — Progress Notes (Signed)
Subjective:    Patient ID: Willie Hughes, male    DOB: 05/30/1961, 56 y.o.   MRN: [id number]  HPI  Willie Hughes is a 56 year old male who presents today for follow up of diabetes.  Current medications include: Glimepiride 4 mg, Lantus 25 units, Novolog 8 units three times daily, metformin 1000 mg BID. He will sometimes skip doses of his Novolog   He is checking his blood glucose 2-3 times daily and is getting readings of: Fasting AM: 97-120 Before lunch: 200-250 Before dinner: 250-300  Last A1C: 10.3 Last Eye Exam: Due in April 2019 Last Foot Exam: Due in January 2019 Pneumonia Vaccination: Completed Pneumovax in 2014 ACE/ARB: None, due for urine microalbumin  Statin: Simvastatin        Review of Systems  Eyes: Negative for visual disturbance.  Respiratory: Negative for shortness of breath.   Cardiovascular: Negative for chest pain.  Neurological: Negative for dizziness, numbness and headaches.       Past Medical History:  Diagnosis Date  . Acne   . Allergic rhinitis   . Bipolar disorder (HCC)   . Erectile dysfunction   . Hyperlipidemia   . Hypothyroidism   . Type 2 diabetes mellitus (HCC)      Social History   Social History  . Marital status: Single    Spouse name: N/A  . Number of children: 0  . Years of education: N/A   Occupational History  . Not on file.   Social History Main Topics  . Smoking status: Former Smoker    Types: E-cigarettes  . Smokeless tobacco: Never Used     Comment: Vapor  . Alcohol use 0.0 oz/week     Comment: a few beers a month  . Drug use: Yes    Types: Marijuana     Comment: over 4152yr ago was last use THC  . Sexual activity: Not on file   Other Topics Concern  . Not on file   Social History Narrative   Single and has one cat. Living in [place]. Never married. No kids.    Born and raised in [place] by parents. 2 sisters and 1 brother and pt is the youngest. Pt has 2 associates degrees in Sports administrator[employer]otech and Mechanical design   Disabled for Bipolar disorder.  Last worked 2008    Enjoys watching TV, playing on the Internet.    Past Surgical History:  Procedure Laterality Date  . WISDOM TOOTH EXTRACTION      Family History  Problem Relation Age of Onset  . Heart attack Father   . Stroke Father   . Stroke Mother   . Diabetes Mother   . Depression Mother   . Depression Sister   . Bipolar disorder Paternal Aunt   . Lupus Sister     Allergies  Allergen Reactions  . Kava Kava     Current Outpatient Prescriptions on File Prior to Visit  Medication Sig Dispense Refill  . acetaminophen (TYLENOL) 500 MG tablet Take 1,000 mg by mouth 3 (three) times daily.     Marland Kitchen. albuterol (PROVENTIL HFA;VENTOLIN HFA) 108 (90 Base) MCG/ACT inhaler Inhale 2 puffs into the lungs every 4 (four) hours as needed for wheezing or shortness of breath. 1 Inhaler 0  . amoxicillin-clavulanate (AUGMENTIN) 875-125 MG tablet Take 1 tablet by mouth 2 (two) times daily. One po bid x 7 days 14 tablet 0  . ARIPiprazole (ABILIFY) 15 MG tablet Take 0.5 tablets (7.5 mg total) by mouth daily. 15 tablet  1  . Ascorbic Acid (VITAMIN C) 1000 MG tablet Take 1,000 mg by mouth daily.    Marland Kitchen aspirin 325 MG tablet Take 325 mg by mouth daily.    . Calcium Carbonate-Vitamin D3 (CALCIUM 600-D) 600-400 MG-UNIT TABS Take 1 tablet by mouth daily.     . Fenofibrate 150 MG CAPS Take 1 capsule (150 mg total) by mouth daily. 90 each 3  . fexofenadine (ALLEGRA) 180 MG tablet Take 180 mg by mouth 2 (two) times daily.     Marland Kitchen glimepiride (AMARYL) 4 MG tablet Take 1 tablet (4 mg total) by mouth daily. 90 tablet 3  . glucose blood (ONE TOUCH TEST STRIPS) test strip Use as instructed to test blood sugar 3 times daily 300 each 2  . GuaiFENesin (MUCINEX PO) Take 1,200 mg by mouth 2 (two) times daily.    . insulin aspart (NOVOLOG FLEXPEN) 100 UNIT/ML FlexPen Inject 8 units into the skin three times daily before meals for blood sugars above 120. 15 mL 2  . Insulin Glargine (LANTUS)  100 UNIT/ML Solostar Pen Inject 25 Units into the skin every evening. 15 mL 11  . Insulin Pen Needle (COMFORT EZ PEN NEEDLES) 31G X 6 MM MISC Use with Lantus Insulin once every evening. 100 each 11  . levothyroxine (SYNTHROID, LEVOTHROID) 50 MCG tablet TAKE ONE TABLET BY MOUTH DAILY ON AN EMPTY STOMACH BEFORE BREAKFAST 90 tablet 1  . lithium carbonate 300 MG capsule Take 3 capsules (900 mg total) by mouth daily. 90 capsule 1  . metFORMIN (GLUCOPHAGE) 1000 MG tablet Take 1 tablet (1,000 mg total) by mouth 2 (two) times daily. 180 tablet 2  . Multiple Vitamins-Minerals (CENTRUM SILVER) tablet Take 1 tablet by mouth daily.     Marland Kitchen neomycin-polymyxin-hydrocortisone (CORTISPORIN) 3.5-10000-1 OTIC suspension Place 4 drops into the right ear 3 (three) times daily. For 7 days 10 mL 0  . Omega-3 Fatty Acids (EQL FISH OIL PO) Take 1,000 mg by mouth 3 (three) times daily.     Letta Pate DELICA LANCETS 33G MISC Inject into the skin. Reported on 01/27/2016    . pseudoephedrine (SUDAFED) 120 MG 12 hr tablet Take 120 mg by mouth 2 (two) times daily.     . ranitidine (ZANTAC) 150 MG tablet Take 150 mg by mouth 2 (two) times daily.    . sildenafil (VIAGRA) 100 MG tablet Take 1 tablet (100 mg total) by mouth daily as needed for erectile dysfunction. 16 tablet 5  . simvastatin (ZOCOR) 40 MG tablet Take 1 tablet (40 mg total) by mouth at bedtime. 90 tablet 2  . tretinoin (RETIN-A) 0.05 % cream APPLY  CREAM TOPICALLY TO AFFECTED AREA AS NEEDED 45 g 5   No current facility-administered medications on file prior to visit.     BP 126/78   Pulse 100   Temp 98.2 F (36.8 C) (Oral)   Ht 6\' 4"  (1.93 m)   Wt 230 lb (104.3 kg)   SpO2 95%   BMI 28.00 kg/m    Objective:   Physical Exam  Constitutional: He appears well-nourished.  Neck: Neck supple.  Cardiovascular: Normal rate and regular rhythm.   Pulmonary/Chest: Effort normal and breath sounds normal.  Skin: Skin is warm and dry.  Psychiatric: He has a normal  mood and affect.          Assessment & Plan:

## 2017-05-21 NOTE — Addendum Note (Signed)
Addended by: Alvina ChouWALSH, TERRI J on: 05/21/2017 12:20 PM   Modules accepted: Orders

## 2017-05-21 NOTE — Addendum Note (Signed)
Addended by: Tawnya CrookSAMBATH, Zykee Avakian on: 05/21/2017 01:02 PM   Modules accepted: Orders

## 2017-05-21 NOTE — Patient Instructions (Addendum)
Continue Lantus 25 units once daily.   Inject Novolog 8 units three times daily before meals for blood sugars above 120. Inject 10 units before a meal for blood sugars above 200.  Bring your glucose logs to your next appointment.  Complete lab work prior to leaving today. I will notify you of your results once received.   It was a pleasure to see you today!  Diabetes Mellitus and Food It is important for you to manage your blood sugar (glucose) level. Your blood glucose level can be greatly affected by what you eat. Eating healthier foods in the appropriate amounts throughout the day at about the same time each day will help you control your blood glucose level. It can also help slow or prevent worsening of your diabetes mellitus. Healthy eating may even help you improve the level of your blood pressure and reach or maintain a healthy weight. General recommendations for healthful eating and cooking habits include:  Eating meals and snacks regularly. Avoid going long periods of time without eating to lose weight.  Eating a diet that consists mainly of plant-based foods, such as fruits, vegetables, nuts, legumes, and whole grains.  Using low-heat cooking methods, such as baking, instead of high-heat cooking methods, such as deep frying.  Work with your dietitian to make sure you understand how to use the Nutrition Facts information on food labels. How can food affect me? Carbohydrates Carbohydrates affect your blood glucose level more than any other type of food. Your dietitian will help you determine how many carbohydrates to eat at each meal and teach you how to count carbohydrates. Counting carbohydrates is important to keep your blood glucose at a healthy level, especially if you are using insulin or taking certain medicines for diabetes mellitus. Alcohol Alcohol can cause sudden decreases in blood glucose (hypoglycemia), especially if you use insulin or take certain medicines for diabetes  mellitus. Hypoglycemia can be a life-threatening condition. Symptoms of hypoglycemia (sleepiness, dizziness, and disorientation) are similar to symptoms of having too much alcohol. If your health care provider has given you approval to drink alcohol, do so in moderation and use the following guidelines:  Women should not have more than one drink per day, and men should not have more than two drinks per day. One drink is equal to: ? 12 oz of beer. ? 5 oz of wine. ? 1 oz of hard liquor.  Do not drink on an empty stomach.  Keep yourself hydrated. Have water, diet soda, or unsweetened iced tea.  Regular soda, juice, and other mixers might contain a lot of carbohydrates and should be counted.  What foods are not recommended? As you make food choices, it is important to remember that all foods are not the same. Some foods have fewer nutrients per serving than other foods, even though they might have the same number of calories or carbohydrates. It is difficult to get your body what it needs when you eat foods with fewer nutrients. Examples of foods that you should avoid that are high in calories and carbohydrates but low in nutrients include:  Trans fats (most processed foods list trans fats on the Nutrition Facts label).  Regular soda.  Juice.  Candy.  Sweets, such as cake, pie, doughnuts, and cookies.  Fried foods.  What foods can I eat? Eat nutrient-rich foods, which will nourish your body and keep you healthy. The food you should eat also will depend on several factors, including:  The calories you need.  The  medicines you take.  Your weight.  Your blood glucose level.  Your blood pressure level.  Your cholesterol level.  You should eat a variety of foods, including:  Protein. ? Lean cuts of meat. ? Proteins low in saturated fats, such as fish, egg whites, and beans. Avoid processed meats.  Fruits and vegetables. ? Fruits and vegetables that may help control blood  glucose levels, such as apples, mangoes, and yams.  Dairy products. ? Choose fat-free or low-fat dairy products, such as milk, yogurt, and cheese.  Grains, bread, pasta, and rice. ? Choose whole grain products, such as multigrain bread, whole oats, and brown rice. These foods may help control blood pressure.  Fats. ? Foods containing healthful fats, such as nuts, avocado, olive oil, canola oil, and fish.  Does everyone with diabetes mellitus have the same meal plan? Because every person with diabetes mellitus is different, there is not one meal plan that works for everyone. It is very important that you meet with a dietitian who will help you create a meal plan that is just right for you. This information is not intended to replace advice given to you by your health care provider. Make sure you discuss any questions you have with your health care provider. Document Released: 04/06/2005 Document Revised: 12/16/2015 Document Reviewed: 06/06/2013 Elsevier Interactive Patient Education  2017 [employer].

## 2017-05-21 NOTE — Assessment & Plan Note (Signed)
Endorses compliance to medications, now checking sugars 2-3 times daily. Will have him continue Novolog 8 units for sugars above 120 and below 200, and Novolog 10 units for sugars above 200. Will see him back in the office in 1 month. Strongly recommended he bring his sugar logs as he did forget them today.

## 2017-05-27 ENCOUNTER — Encounter: Payer: Self-pay | Admitting: Primary Care

## 2017-06-10 ENCOUNTER — Other Ambulatory Visit: Payer: Self-pay | Admitting: Primary Care

## 2017-06-10 DIAGNOSIS — E119 Type 2 diabetes mellitus without complications: Secondary | ICD-10-CM

## 2017-06-19 ENCOUNTER — Other Ambulatory Visit: Payer: Self-pay | Admitting: Primary Care

## 2017-06-19 MED ORDER — ONETOUCH DELICA LANCETS 33G MISC: 1 refills | Status: DC

## 2017-06-21 ENCOUNTER — Encounter (HOSPITAL_COMMUNITY): Payer: Self-pay | Admitting: Psychiatry

## 2017-06-21 ENCOUNTER — Ambulatory Visit (HOSPITAL_COMMUNITY): Payer: PPO | Admitting: Psychiatry

## 2017-06-21 DIAGNOSIS — F3160 Bipolar disorder, current episode mixed, unspecified: Secondary | ICD-10-CM

## 2017-06-21 DIAGNOSIS — Z818 Family history of other mental and behavioral disorders: Secondary | ICD-10-CM | POA: Diagnosis not present

## 2017-06-21 DIAGNOSIS — Z79899 Other long term (current) drug therapy: Secondary | ICD-10-CM | POA: Diagnosis not present

## 2017-06-21 DIAGNOSIS — G4700 Insomnia, unspecified: Secondary | ICD-10-CM

## 2017-06-21 DIAGNOSIS — F4010 Social phobia, unspecified: Secondary | ICD-10-CM | POA: Diagnosis not present

## 2017-06-21 DIAGNOSIS — Z87891 Personal history of nicotine dependence: Secondary | ICD-10-CM

## 2017-06-21 MED ORDER — ARIPIPRAZOLE 15 MG PO TABS: 7.5000 mg | ORAL_TABLET | Freq: Every day | ORAL | 2 refills | Status: DC

## 2017-06-21 MED ORDER — LITHIUM CARBONATE 300 MG PO CAPS: 900.0000 mg | ORAL_CAPSULE | Freq: Every day | ORAL | 2 refills | Status: DC
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
## 2017-07-04 ENCOUNTER — Other Ambulatory Visit: Payer: Self-pay | Admitting: Primary Care

## 2017-07-04 DIAGNOSIS — E119 Type 2 diabetes mellitus without complications: Secondary | ICD-10-CM

## 2017-07-10 ENCOUNTER — Encounter: Payer: Self-pay | Admitting: Primary Care

## 2017-07-10 DIAGNOSIS — Z794 Long term (current) use of insulin: Principal | ICD-10-CM

## 2017-07-10 DIAGNOSIS — E118 Type 2 diabetes mellitus with unspecified complications: Secondary | ICD-10-CM

## 2017-07-11 MED ORDER — "INSULIN SYRINGE-NEEDLE U-100 31G X 5/16"" 1 ML MISC": 1 refills | Status: AC

## 2017-07-13 ENCOUNTER — Encounter: Payer: Self-pay | Admitting: Primary Care

## 2017-07-13 DIAGNOSIS — N529 Male erectile dysfunction, unspecified: Secondary | ICD-10-CM

## 2017-07-16 MED ORDER — SILDENAFIL CITRATE 100 MG PO TABS: 100.0000 mg | ORAL_TABLET | Freq: Every day | ORAL | 5 refills | Status: DC | PRN

## 2017-07-25 ENCOUNTER — Encounter: Payer: Self-pay | Admitting: Primary Care

## 2017-07-29 ENCOUNTER — Other Ambulatory Visit: Payer: Self-pay | Admitting: Primary Care

## 2017-07-29 DIAGNOSIS — E785 Hyperlipidemia, unspecified: Secondary | ICD-10-CM

## 2017-08-14 ENCOUNTER — Ambulatory Visit: Payer: Self-pay | Admitting: Primary Care

## 2017-09-07 ENCOUNTER — Ambulatory Visit: Payer: PPO | Admitting: Primary Care

## 2017-09-20 ENCOUNTER — Ambulatory Visit (HOSPITAL_COMMUNITY): Payer: PPO | Admitting: Psychiatry

## 2017-09-20 ENCOUNTER — Encounter (HOSPITAL_COMMUNITY): Payer: Self-pay | Admitting: Psychiatry

## 2017-09-20 VITALS — BP 130/83 | HR 96 | Ht 76.0 in | Wt 228.4 lb

## 2017-09-20 DIAGNOSIS — F3160 Bipolar disorder, current episode mixed, unspecified: Secondary | ICD-10-CM | POA: Diagnosis not present

## 2017-09-20 DIAGNOSIS — Z87891 Personal history of nicotine dependence: Secondary | ICD-10-CM

## 2017-09-20 DIAGNOSIS — F4010 Social phobia, unspecified: Secondary | ICD-10-CM | POA: Diagnosis not present

## 2017-09-20 DIAGNOSIS — M2550 Pain in unspecified joint: Secondary | ICD-10-CM

## 2017-09-20 DIAGNOSIS — R42 Dizziness and giddiness: Secondary | ICD-10-CM | POA: Diagnosis not present

## 2017-09-20 DIAGNOSIS — F440 Dissociative amnesia: Secondary | ICD-10-CM

## 2017-09-20 DIAGNOSIS — Z818 Family history of other mental and behavioral disorders: Secondary | ICD-10-CM | POA: Diagnosis not present

## 2017-09-20 DIAGNOSIS — R51 Headache: Secondary | ICD-10-CM | POA: Diagnosis not present

## 2017-09-20 DIAGNOSIS — G4700 Insomnia, unspecified: Secondary | ICD-10-CM

## 2017-09-20 DIAGNOSIS — F209 Schizophrenia, unspecified: Secondary | ICD-10-CM

## 2017-09-20 MED ORDER — LITHIUM CARBONATE 300 MG PO CAPS: 900.0000 mg | ORAL_CAPSULE | Freq: Every day | ORAL | 2 refills | Status: DC
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
## 2017-09-21 ENCOUNTER — Other Ambulatory Visit: Payer: Self-pay | Admitting: Primary Care

## 2017-09-21 DIAGNOSIS — E032 Hypothyroidism due to medicaments and other exogenous substances: Secondary | ICD-10-CM

## 2017-09-21 DIAGNOSIS — E785 Hyperlipidemia, unspecified: Secondary | ICD-10-CM

## 2017-09-21 DIAGNOSIS — E118 Type 2 diabetes mellitus with unspecified complications: Secondary | ICD-10-CM

## 2017-09-21 DIAGNOSIS — Z794 Long term (current) use of insulin: Principal | ICD-10-CM

## 2017-09-26 ENCOUNTER — Other Ambulatory Visit (INDEPENDENT_AMBULATORY_CARE_PROVIDER_SITE_OTHER): Payer: PPO

## 2017-09-26 DIAGNOSIS — E118 Type 2 diabetes mellitus with unspecified complications: Secondary | ICD-10-CM

## 2017-09-26 DIAGNOSIS — E785 Hyperlipidemia, unspecified: Secondary | ICD-10-CM

## 2017-09-26 DIAGNOSIS — E032 Hypothyroidism due to medicaments and other exogenous substances: Secondary | ICD-10-CM | POA: Diagnosis not present

## 2017-09-26 DIAGNOSIS — Z794 Long term (current) use of insulin: Secondary | ICD-10-CM | POA: Diagnosis not present

## 2017-09-26 LAB — COMPREHENSIVE METABOLIC PANEL
ALBUMIN: 4.7 g/dL (ref 3.5–5.2)
ALK PHOS: 79 U/L (ref 39–117)
ALT: 43 U/L (ref 0–53)
AST: 42 U/L — ABNORMAL HIGH (ref 0–37)
BILIRUBIN TOTAL: 0.4 mg/dL (ref 0.2–1.2)
BUN: 8 mg/dL (ref 6–23)
CALCIUM: 10.8 mg/dL — AB (ref 8.4–10.5)
CO2: 30 mEq/L (ref 19–32)
Chloride: 98 mEq/L (ref 96–112)
Creatinine, Ser: 0.76 mg/dL (ref 0.40–1.50)
GFR: 112.63 mL/min (ref >60.00)
Glucose, Bld: 175 mg/dL — ABNORMAL HIGH (ref 70–99)
Potassium: 3.6 mEq/L (ref 3.5–5.1)
Sodium: 136 mEq/L (ref 135–145)
TOTAL PROTEIN: 7.5 g/dL (ref 6.0–8.3)

## 2017-09-26 LAB — LDL CHOLESTEROL, DIRECT: LDL DIRECT: 73 mg/dL

## 2017-09-26 LAB — T4, FREE: Free T4: 0.9 ng/dL (ref 0.60–1.60)

## 2017-09-26 LAB — LIPID PANEL
CHOLESTEROL: 140 mg/dL (ref 0–200)
HDL: 32.6 mg/dL — ABNORMAL LOW (ref >39.00)
NonHDL: 107.76
TRIGLYCERIDES: 334 mg/dL — AB (ref 0.0–149.0)
Total CHOL/HDL Ratio: 4
VLDL: 66.8 mg/dL — ABNORMAL HIGH (ref 0.0–40.0)

## 2017-09-26 LAB — HEMOGLOBIN A1C: Hgb A1c MFr Bld: 9.7 % — ABNORMAL HIGH (ref 4.6–6.5)

## 2017-09-26 LAB — TSH: TSH: 5.49 u[IU]/mL — AB (ref 0.35–4.50)

## 2017-09-28 ENCOUNTER — Encounter: Payer: Self-pay | Admitting: Primary Care

## 2017-09-28 ENCOUNTER — Ambulatory Visit (INDEPENDENT_AMBULATORY_CARE_PROVIDER_SITE_OTHER): Payer: PPO | Admitting: Primary Care

## 2017-09-28 VITALS — BP 120/66 | HR 86 | Temp 98.1°F | Ht 76.0 in | Wt 230.0 lb

## 2017-09-28 DIAGNOSIS — Z794 Long term (current) use of insulin: Secondary | ICD-10-CM

## 2017-09-28 DIAGNOSIS — E032 Hypothyroidism due to medicaments and other exogenous substances: Secondary | ICD-10-CM | POA: Diagnosis not present

## 2017-09-28 DIAGNOSIS — F3174 Bipolar disorder, in full remission, most recent episode manic: Secondary | ICD-10-CM | POA: Diagnosis not present

## 2017-09-28 DIAGNOSIS — E118 Type 2 diabetes mellitus with unspecified complications: Secondary | ICD-10-CM

## 2017-09-28 DIAGNOSIS — E785 Hyperlipidemia, unspecified: Secondary | ICD-10-CM | POA: Diagnosis not present

## 2017-09-28 MED ORDER — LEVOTHYROXINE SODIUM 75 MCG PO TABS: ORAL_TABLET | ORAL | 1 refills | Status: DC

## 2017-09-28 NOTE — Assessment & Plan Note (Signed)
LDL at goal, continue simvastatin.  

## 2017-09-28 NOTE — Assessment & Plan Note (Signed)
TSH slightly more elevated than prior, will increase levothyroxine to 75 mcg.  Repeat TSH in 6 weeks.

## 2017-09-28 NOTE — Assessment & Plan Note (Signed)
Has been off Abilify since November 2018, feeling well.  Compliant to lithium 900 mg daily.  Following with psychiatry

## 2017-09-28 NOTE — Patient Instructions (Addendum)
Resume your Relion N at 15 units twice daily.  Please check your blood sugars three times daily: Morning fasting, before lunch, at bedtime.  We've increased your levothyroxine to 75 mcg. Take 1 tablet by mouth every morning on an empty stomach with a full glass of water.  Schedule a lab only appointment in 6 weeks for repeat thyroid function.  Stop by the lab to complete the urine sample.   Please message me in 2 weeks for your blood sugar readings.  Stop by the front desk and speak with either Shirlee LimerickMarion or Zella Ballobin regarding your referral to endocrinology.  It was a pleasure to see you today!

## 2017-09-28 NOTE — Assessment & Plan Note (Signed)
Remains uncontrolled, he remains noncompliant.  He decided to decrease his insulin to once daily dosing rather than twice daily as prescribed.  He is also not checking his blood sugars as recommended, only checking once daily.  Recent A1c of 9.7 which is an improvement but likely to increase given that he is been taking insulin once daily.  Strongly recommended he resume insulin twice daily at 15 units.  Referral placed to endocrinology given failed attempts to gain control.  Foot exam today.  Urine micro albumin pending.  Managed on statin.

## 2017-09-28 NOTE — Progress Notes (Signed)
Subjective:    Patient ID: Willie Hughes, male    DOB: 09-Aug-1960, 57 y.o.   MRN: [id number]  HPI  Willie Hughes is a 57 year old male who presents today for follow up.  1) Type 2 Diabetes:  Current medications include: Glimepiride 4 mg, metformin 1000 mg BID, Relion N 15 units BID.  He was previously managed on Lantus 25 units at bedtime and NovoLog 8-10 units 3 times daily, but could no longer afford this regimen.  He was switched over to Relion in mid December 2018.  He is checking his blood glucose one times daily and is getting readings of: AM Fasting: 160-180.   Last A1C: 9.7 Last Eye Exam: Due in April 2019 Last Foot Exam: Due Pneumonia Vaccination: Completed in 2014. ACE/ARB: Urine microalbumin due today. Statin: Simvastatin   Diet currently consists of:  Breakfast: Orange juice, yogurt, fruit salad, canned fruit Lunch: Sandwich, chips, vegetables Dinner: Pasta, pizza, meat, chips, veggies  Snacks: None Desserts: 3 times weekly Beverages: Orange juice, diet soda, diet tea  2) Hypothyroidism: Currently managed on levothyroxine 50 mcg. Recent TSH of 5.49, prior TSH of 5.0 in October 2018.  He endorses persistent fatigue.  He is compliant to lithium prescribed by psychiatry.  3) Bipolar Disorder: Currently managed on Lithium 900 mg once daily. He stopped his Abilify in late November 2018. He is still following with psychiatry regularly.      Review of Systems  Constitutional: Positive for fatigue.  Eyes: Negative for visual disturbance.  Respiratory: Negative for shortness of breath.   Cardiovascular: Negative for chest pain.  Neurological: Negative for numbness.  Psychiatric/Behavioral:       Following with psychiatry       Past Medical History:  Diagnosis Date  . Acne   . Allergic rhinitis   . Bipolar disorder (HCC)   . Erectile dysfunction   . Hyperlipidemia   . Hypothyroidism   . Type 2 diabetes mellitus (HCC)      Social History   Socioeconomic  History  . Marital status: Single    Spouse name: Not on file  . Number of children: 0  . Years of education: Not on file  . Highest education level: Not on file  Social Needs  . Financial resource strain: Not on file  . Food insecurity - worry: Not on file  . Food insecurity - inability: Not on file  . Transportation needs - medical: Not on file  . Transportation needs - non-medical: Not on file  Occupational History  . Not on file  Tobacco Use  . Smoking status: Former Smoker    Types: E-cigarettes  . Smokeless tobacco: Never Used  . Tobacco comment: Vapor  Substance and Sexual Activity  . Alcohol use: Yes    Alcohol/week: 0.0 oz    Comment: a few beers a month  . Drug use: Yes    Types: Marijuana    Comment: over 40yr ago was last use THC  . Sexual activity: No  Other Topics Concern  . Not on file  Social History Narrative   Single and has one cat. Living in [place]. Never married. No kids.    Born and raised in [place] by parents. 2 sisters and 1 brother and pt is the youngest. Pt has 2 associates degrees in Sports administrator   Disabled for Bipolar disorder.  Last worked 2008    Enjoys watching TV, playing on the Internet.    Past Surgical History:  Procedure  Laterality Date  . WISDOM TOOTH EXTRACTION      Family History  Problem Relation Age of Onset  . Heart attack Father   . Stroke Father   . Stroke Mother   . Diabetes Mother   . Depression Mother   . Depression Sister   . Bipolar disorder Paternal Aunt   . Lupus Sister     Allergies  Allergen Reactions  . Kava Kava     Current Outpatient Medications on File Prior to Visit  Medication Sig Dispense Refill  . acetaminophen (TYLENOL) 500 MG tablet Take 1,000 mg by mouth 3 (three) times daily.     . Ascorbic Acid (VITAMIN C) 1000 MG tablet Take 1,000 mg by mouth daily.    Marland Kitchen. aspirin 325 MG tablet Take 325 mg by mouth daily.    . Calcium Carbonate-Vitamin D3 (CALCIUM 600-D) 600-400 MG-UNIT TABS  Take 1 tablet by mouth daily.     . Fenofibrate 150 MG CAPS Take 1 capsule (150 mg total) by mouth daily. 90 each 3  . fexofenadine (ALLEGRA) 180 MG tablet Take 180 mg by mouth 2 (two) times daily.     Marland Kitchen. glimepiride (AMARYL) 4 MG tablet Take 1 tablet (4 mg total) by mouth daily. 90 tablet 3  . glucose blood (ONE TOUCH TEST STRIPS) test strip Use as instructed to test blood sugar 3 times daily 300 each 2  . GuaiFENesin (MUCINEX PO) Take 1,200 mg by mouth 2 (two) times daily.    . insulin NPH Human (HUMULIN N,NOVOLIN N) 100 UNIT/ML injection Inject 15 Units into the skin 2 (two) times daily.    . Insulin Pen Needle (COMFORT EZ PEN NEEDLES) 31G X 6 MM MISC Use with Lantus Insulin once every evening. 100 each 11  . Insulin Syringe-Needle U-100 (RELION INSULIN SYRINGE 1ML/31G) 31G X 5/16" 1 ML MISC Use with insulin twice daily as directed. 100 each 1  . lithium carbonate 300 MG capsule Take 3 capsules (900 mg total) by mouth daily. 90 capsule 2  . metFORMIN (GLUCOPHAGE) 1000 MG tablet TAKE ONE TABLET BY MOUTH TWICE A DAY WITH FOOD 180 tablet 0  . Multiple Vitamins-Minerals (CENTRUM SILVER) tablet Take 1 tablet by mouth daily.     . Omega-3 Fatty Acids (EQL FISH OIL PO) Take 1,000 mg by mouth 3 (three) times daily.     Letta Pate. ONETOUCH DELICA LANCETS 33G MISC Use as instructed to test blood sugar 3 times daily 300 each 1  . pseudoephedrine (SUDAFED) 120 MG 12 hr tablet Take 120 mg by mouth 2 (two) times daily.     . ranitidine (ZANTAC) 150 MG tablet Take 150 mg by mouth 2 (two) times daily.    . sildenafil (VIAGRA) 100 MG tablet Take 1 tablet (100 mg total) by mouth daily as needed for erectile dysfunction. 16 tablet 5  . simvastatin (ZOCOR) 40 MG tablet TAKE ONE TABLET BY MOUTH EVERY NIGHT AT BEDTIME 90 tablet 1  . tretinoin (RETIN-A) 0.05 % cream APPLY  CREAM TOPICALLY TO AFFECTED AREA AS NEEDED 45 g 5  . albuterol (PROVENTIL HFA;VENTOLIN HFA) 108 (90 Base) MCG/ACT inhaler Inhale 2 puffs into the lungs  every 4 (four) hours as needed for wheezing or shortness of breath. (Patient not taking: Reported on 09/28/2017) 1 Inhaler 0   No current facility-administered medications on file prior to visit.     BP 120/66   Pulse 86   Temp 98.1 F (36.7 C) (Oral)   Ht 6\' 4"  (1.93 m)  Wt 230 lb (104.3 kg)   SpO2 97%   BMI 28.00 kg/m    Objective:   Physical Exam  Constitutional: He is oriented to person, place, and time. He appears well-nourished.  Neck: Neck supple.  Cardiovascular: Normal rate and regular rhythm.  Pulmonary/Chest: Effort normal and breath sounds normal. He has no wheezes. He has no rales.  Neurological: He is alert and oriented to person, place, and time.  Skin: Skin is warm and dry.  Psychiatric: He has a normal mood and affect.          Assessment & Plan:

## 2017-09-29 LAB — MICROALBUMIN / CREATININE URINE RATIO
Creatinine, Urine: 81 mg/dL (ref 20–320)
MICROALB UR: 0.9 mg/dL
MICROALB/CREAT RATIO: 11 ug/mg{creat} (ref <30)

## 2017-10-09 ENCOUNTER — Encounter: Payer: Self-pay | Admitting: Primary Care

## 2017-10-09 DIAGNOSIS — Z8269 Family history of other diseases of the musculoskeletal system and connective tissue: Secondary | ICD-10-CM

## 2017-10-11 ENCOUNTER — Other Ambulatory Visit: Payer: Self-pay | Admitting: Primary Care

## 2017-10-11 DIAGNOSIS — E119 Type 2 diabetes mellitus without complications: Secondary | ICD-10-CM

## 2017-10-16 ENCOUNTER — Telehealth: Payer: Self-pay

## 2017-10-16 NOTE — Telephone Encounter (Signed)
Spoke with patient and advised him that [facility] and referral to them has been declined after reviewing his records, reason is no present symptoms to support possible Lupus issue and lab work been negative for this issue. Patient advised. Patient stated that something needed to be figured out because he has constant muscle/joint pain and fatigue/tired (which he states may be coming from poor controlled diabetes). Patient wants provider and her "supervisior" to figure out what to do or who to see "this is real and not in his imagination" per patient. Advised patient I would send note back to review and someone will give him a call back with Reola Mosherfeedback-Anastasiya V Hopkins, RMA CB [phone] (M)

## 2017-10-16 NOTE — Telephone Encounter (Signed)
Patient has been non compliant to his diabetes regimen for nearly one year and was referred to endocrinology during his last visit. I highly suspect a lot of his symptoms could be secondary to uncontrolled diabetes (as discussed on numerous visits) but again, cannot help him if he doesn't follow through on my recommendations for treatment. He also fails to monitor his glucose levels as instructed which is very dangerous when on insulin.   I'm more than willing to continue to work on his diabetes until he sees the endorciologist (as I've done over the past one year), but he's got to start checking his glucose levels regularly and comply to my directions for treatment.   Has he been checking his glucose levels three times daily as instructed? How often? What are the readings? Is he injecting Relion N 15 units twice daily as instructed?

## 2017-10-18 NOTE — Telephone Encounter (Signed)
Tried to reach X 2 (home and Cell).  LM on cell to please return our call to discuss further.

## 2017-10-23 NOTE — Telephone Encounter (Signed)
Tried again X 2 (home and cell) to reach patient in response to his concerns and Vernona RiegerKatherine Clark NP's response.  Left vague message on cell to please return our call as cannot leave details per DPR.  There was no machine at home number to leave message but I did let ring multiple times.

## 2017-10-25 ENCOUNTER — Encounter: Payer: Self-pay | Admitting: Primary Care

## 2017-10-25 DIAGNOSIS — L709 Acne, unspecified: Secondary | ICD-10-CM

## 2017-10-25 MED ORDER — TRETINOIN 0.05 % EX CREA
TOPICAL_CREAM | CUTANEOUS | 0 refills | Status: DC
Start: 2017-10-25 — End: 2017-12-24

## 2017-10-25 NOTE — Telephone Encounter (Signed)
Willie Hughes, see my chart message below. Can you respond to patient regarding rheumatology referral for Lupus? Thanks!

## 2017-10-26 ENCOUNTER — Other Ambulatory Visit: Payer: Self-pay | Admitting: Primary Care

## 2017-10-26 ENCOUNTER — Encounter (INDEPENDENT_AMBULATORY_CARE_PROVIDER_SITE_OTHER): Payer: Self-pay

## 2017-10-26 DIAGNOSIS — Z840 Family history of diseases of the skin and subcutaneous tissue: Secondary | ICD-10-CM

## 2017-10-26 NOTE — Telephone Encounter (Signed)
Will send my chart message, he seems to respond via My Chart well.

## 2017-10-26 NOTE — Telephone Encounter (Signed)
I understand that you will be reaching out to patient today.  I want to be sure you are aware that I have been unsuccessful in reaching patient for the past 2 weeks, despite making multiple attempts.  Please let me know what or if I need to communicate further with him after you speak to him today.    Thanks.

## 2017-11-01 ENCOUNTER — Other Ambulatory Visit (INDEPENDENT_AMBULATORY_CARE_PROVIDER_SITE_OTHER): Payer: PPO

## 2017-11-01 DIAGNOSIS — E032 Hypothyroidism due to medicaments and other exogenous substances: Secondary | ICD-10-CM

## 2017-11-01 DIAGNOSIS — Z840 Family history of diseases of the skin and subcutaneous tissue: Secondary | ICD-10-CM

## 2017-11-01 LAB — POCT URINALYSIS DIPSTICK
BILIRUBIN UA: NEGATIVE
Blood, UA: NEGATIVE
KETONES UA: NEGATIVE
Leukocytes, UA: NEGATIVE
Nitrite, UA: NEGATIVE
Protein, UA: NEGATIVE
Spec Grav, UA: 1.015 (ref 1.010–1.025)
Urobilinogen, UA: 0.2 E.U./dL
pH, UA: 6 (ref 5.0–8.0)

## 2017-11-01 LAB — CBC
HEMATOCRIT: 46.1 % (ref 39.0–52.0)
HEMOGLOBIN: 15.4 g/dL (ref 13.0–17.0)
MCHC: 33.4 g/dL (ref 30.0–36.0)
MCV: 91.7 fl (ref 78.0–100.0)
PLATELETS: 331 10*3/uL (ref 150.0–400.0)
RBC: 5.03 Mil/uL (ref 4.22–5.81)
RDW: 12.6 % (ref 11.5–15.5)
WBC: 8 10*3/uL (ref 4.0–10.5)

## 2017-11-01 LAB — SEDIMENTATION RATE: Sed Rate: 9 mm/hr (ref 0–20)

## 2017-11-01 LAB — TSH: TSH: 3.01 u[IU]/mL (ref 0.35–4.50)

## 2017-11-04 LAB — ANA: Anti Nuclear Antibody(ANA): NEGATIVE

## 2017-11-20 ENCOUNTER — Ambulatory Visit: Payer: PPO | Admitting: Internal Medicine

## 2017-11-20 ENCOUNTER — Encounter: Payer: Self-pay | Admitting: Internal Medicine

## 2017-11-20 ENCOUNTER — Other Ambulatory Visit: Payer: Self-pay | Admitting: Primary Care

## 2017-11-20 VITALS — BP 116/74 | HR 94 | Ht 76.0 in | Wt 228.2 lb

## 2017-11-20 DIAGNOSIS — E032 Hypothyroidism due to medicaments and other exogenous substances: Secondary | ICD-10-CM

## 2017-11-20 DIAGNOSIS — E1165 Type 2 diabetes mellitus with hyperglycemia: Secondary | ICD-10-CM

## 2017-11-20 DIAGNOSIS — E1142 Type 2 diabetes mellitus with diabetic polyneuropathy: Secondary | ICD-10-CM | POA: Diagnosis not present

## 2017-11-20 DIAGNOSIS — IMO0002 Reserved for concepts with insufficient information to code with codable children: Secondary | ICD-10-CM | POA: Insufficient documentation

## 2017-11-20 NOTE — Progress Notes (Signed)
Patient ID: Willie Hughes, male   DOB: Mar 06, 1961, 57 y.o.   MRN: [id number]   HPI: Willie Hughes is a 57 y.o.-year-old male, referred by his PCP, Doreene Nest, NP, for management of DM2, dx in 1999 - after Clozapine tx, insulin-dependent since 2018, uncontrolled, with complications (PN, ED).  Last hemoglobin A1c was: Lab Results  Component Value Date   HGBA1C 9.7 (H) 09/26/2017   HGBA1C 10.3 (H) 04/23/2017   HGBA1C 10.6 (H) 02/01/2017   Pt is on a regimen of: - Metformin 1000 mg 2x a day, with meals - Glimepiride 4 mg at lunchtime - Novolin NPH 15 units 2x a day >> 15 units 1x a day >> 10 units 1x a day >> reduced dose and finally stopped b/c hypoglycemia He could not afford analogue insulins.    Pt is not checking his sugars - am: n/c - 2h after b'fast: n/c - before lunch: n/c - 2h after lunch: n/c - before dinner: n/c - 2h after dinner: n/c - bedtime: n/c - nighttime: n/c  Glucometer:  One Touch ultra  Pt's meals are: - Breakfast: OJ, yoghurt, 2 donuts, canned or fresh fruit - Lunch: 2-3 hot dogs + chips; 1 can of chilli; sandwich + chips - Dinner: meat + starch + salad/veggies - Snacks: diet ice tea, diet soda, cider  - no CKD, last BUN/creatinine:  Lab Results  Component Value Date   BUN 8 09/26/2017   BUN 9 04/18/2017   CREATININE 0.76 09/26/2017   CREATININE 1.07 04/18/2017   -+ HL; last set of lipids: Lab Results  Component Value Date   CHOL 140 09/26/2017   HDL 32.60 (L) 09/26/2017   LDLCALC 54 10/12/2016   LDLDIRECT 73.0 09/26/2017   TRIG 334.0 (H) 09/26/2017   CHOLHDL 4 09/26/2017  On simvastatin 40, fenofibrate 150.  - last eye exam was on 10/22/2016.  No DR.   - + numbness and tingling in his feet.  Last foot exam by PCP was in 09/28/2017.  Pt has FH of DM in mother.  She also has a history of hypothyroidism 2/2 Lithium tx, previously on levothyroxine 50 mcg daily, recently increased to 75 mcg daily after TSH returned elevated >> subsequent TSH was  normal: Lab Results  Component Value Date   TSH 3.01 11/01/2017   TSH 5.49 (H) 09/26/2017   TSH 5.020 (H) 04/18/2017   TSH 5.08 (H) 03/09/2017   TSH 5.43 (H) 02/01/2017   His siblings have SLE >> will see Dr. Corliss Skains. Psychiatrist: Dr. Michae Kava  ROS: Constitutional: + weight gain/, + fatigue, no subjective hyperthermia/hypothermia Eyes: no blurry vision, no xerophthalmia ENT: no sore throat, no nodules palpated in throat, no dysphagia/odynophagia, no hoarseness Cardiovascular: +CP/+ SOB/+ palpitations/no leg swelling Respiratory: + cough/+ SOB Gastrointestinal: no N/V/D/C Musculoskeletal: no muscle/joint aches Skin: no rashes Neurological: +  Tremors/no numbness/tingling/dizziness  Past Medical History:  Diagnosis Date  . Acne   . Allergic rhinitis   . Bipolar disorder (HCC)   . Erectile dysfunction   . Hyperlipidemia   . Hypothyroidism   . Type 2 diabetes mellitus (HCC)    Past Surgical History:  Procedure Laterality Date  . WISDOM TOOTH EXTRACTION     Social History   Socioeconomic History  . Marital status: Single    Spouse name: Not on file  . Number of children: 0  . Years of education: Not on file  . Highest education level: Not on file  Occupational History  . Not on file  Social  Needs  . Financial resource strain: Not on file  . Food insecurity:    Worry: Not on file    Inability: Not on file  . Transportation needs:    Medical: Not on file    Non-medical: Not on file  Tobacco Use  . Smoking status: Former Smoker    Types: E-cigarettes  . Smokeless tobacco: Never Used  . Tobacco comment: Vapor  Substance and Sexual Activity  . Alcohol use: Yes    Alcohol/week: 0.0 oz    Comment: a few beers a month  . Drug use: Yes    Types: Marijuana    Comment: over 53yr ago was last use THC  . Sexual activity: Never  Lifestyle  . Physical activity:    Days per week: Not on file    Minutes per session: Not on file  . Stress: Not on file  Relationships   . Social connections:    Talks on phone: Not on file    Gets together: Not on file    Attends religious service: Not on file    Active member of club or organization: Not on file    Attends meetings of clubs or organizations: Not on file    Relationship status: Not on file  . Intimate partner violence:    Fear of current or ex partner: Not on file    Emotionally abused: Not on file    Physically abused: Not on file    Forced sexual activity: Not on file  Other Topics Concern  . Not on file  Social History Narrative   Single and has one cat. Living in [place]. Never married. No kids.    Born and raised in [place] by parents. 2 sisters and 1 brother and pt is the youngest. Pt has 2 associates degrees in Sports administrator   Disabled for Bipolar disorder.  Last worked 2008    Enjoys watching TV, playing on the Internet.   Current Outpatient Medications on File Prior to Visit  Medication Sig Dispense Refill  . acetaminophen (TYLENOL) 500 MG tablet Take 1,000 mg by mouth 3 (three) times daily.     Marland Kitchen albuterol (PROVENTIL HFA;VENTOLIN HFA) 108 (90 Base) MCG/ACT inhaler Inhale 2 puffs into the lungs every 4 (four) hours as needed for wheezing or shortness of breath. (Patient not taking: Reported on 09/28/2017) 1 Inhaler 0  . Ascorbic Acid (VITAMIN C) 1000 MG tablet Take 1,000 mg by mouth daily.    Marland Kitchen aspirin 325 MG tablet Take 325 mg by mouth daily.    . Calcium Carbonate-Vitamin D3 (CALCIUM 600-D) 600-400 MG-UNIT TABS Take 1 tablet by mouth daily.     . Fenofibrate 150 MG CAPS Take 1 capsule (150 mg total) by mouth daily. 90 each 3  . fexofenadine (ALLEGRA) 180 MG tablet Take 180 mg by mouth 2 (two) times daily.     Marland Kitchen glimepiride (AMARYL) 4 MG tablet Take 1 tablet (4 mg total) by mouth daily. 90 tablet 3  . glucose blood (ONE TOUCH TEST STRIPS) test strip Use as instructed to test blood sugar 3 times daily 300 each 2  . GuaiFENesin (MUCINEX PO) Take 1,200 mg by mouth 2 (two) times  daily.    . insulin NPH Human (HUMULIN N,NOVOLIN N) 100 UNIT/ML injection Inject 15 Units into the skin 2 (two) times daily.    . Insulin Pen Needle (COMFORT EZ PEN NEEDLES) 31G X 6 MM MISC Use with Lantus Insulin once every evening. 100 each 11  .  Insulin Syringe-Needle U-100 (RELION INSULIN SYRINGE 1ML/31G) 31G X 5/16" 1 ML MISC Use with insulin twice daily as directed. 100 each 1  . levothyroxine (SYNTHROID, LEVOTHROID) 75 MCG tablet Take 1 tablet by mouth every morning on an empty stomach with a full glass of water. 30 tablet 1  . lithium carbonate 300 MG capsule Take 3 capsules (900 mg total) by mouth daily. 90 capsule 2  . metFORMIN (GLUCOPHAGE) 1000 MG tablet TAKE ONE TABLET BY MOUTH TWICE A DAY WITH FOOD 180 tablet 0  . Multiple Vitamins-Minerals (CENTRUM SILVER) tablet Take 1 tablet by mouth daily.     . Omega-3 Fatty Acids (EQL FISH OIL PO) Take 1,000 mg by mouth 3 (three) times daily.     Letta Pate DELICA LANCETS 33G MISC Use as instructed to test blood sugar 3 times daily 300 each 1  . pseudoephedrine (SUDAFED) 120 MG 12 hr tablet Take 120 mg by mouth 2 (two) times daily.     . ranitidine (ZANTAC) 150 MG tablet Take 150 mg by mouth 2 (two) times daily.    . sildenafil (VIAGRA) 100 MG tablet Take 1 tablet (100 mg total) by mouth daily as needed for erectile dysfunction. 16 tablet 5  . simvastatin (ZOCOR) 40 MG tablet TAKE ONE TABLET BY MOUTH EVERY NIGHT AT BEDTIME 90 tablet 1  . tretinoin (RETIN-A) 0.05 % cream APPLY  CREAM TOPICALLY TO AFFECTED AREA AS NEEDED 45 g 0   No current facility-administered medications on file prior to visit.    Allergies  Allergen Reactions  . Kava Kava   . Clindamycin Rash    Drug eruption - docy v clinda  . Doxycycline Rash  . Other Rash    Some antiperspirants cause a rash   Family History  Problem Relation Age of Onset  . Heart attack Father   . Stroke Father   . Stroke Mother   . Diabetes Mother   . Depression Mother   . Depression  Sister   . Bipolar disorder Paternal Aunt   . Lupus Sister     PE: BP 116/74   Pulse 94   Ht  (1.93 m)   Wt 228 lb 3.2 oz (103.5 kg)   SpO2 98%   BMI 27.78 kg/m  Wt Readings from Last 3 Encounters:  11/20/17 228 lb 3.2 oz (103.5 kg)  09/28/17 230 lb (104.3 kg)  05/21/17 230 lb (104.3 kg)   Constitutional: overweight, in NAD Eyes: PERRLA, EOMI, no exophthalmos ENT: moist mucous membranes, no thyromegaly, no cervical lymphadenopathy Cardiovascular: tachycardia, RR, No MRG Respiratory: CTA B Gastrointestinal: abdomen soft, NT, ND, BS+ Musculoskeletal: no deformities, strength intact in all 4 Skin: moist, warm, no rashes Neurological: +signif.  tremor with outstretched hands, DTR normal in all 4  ASSESSMENT: 1. DM2, insulin-dependent, uncontrolled, with complications - PN - ED  PLAN:  1. Patient with long-standing, uncontrolled diabetes, on oral antidiabetic regimen, which became insufficient. He was on a basal-bolus analog insulin regimen but got into the doughnut hole last year >> changed to NPH insulin. He was started on a high dose and developed hypoglycemia >> had to decrease the dose and stopped ~1-2 weeks ago. Unfortunately, he is now not checking sugars at all. He continues Metformin 2x a day and Amaryl either before L or before D. W/o sugar checks, I cannot advise him to restart insulin >> will continue Metformin and Amaryl (as finances are also a significant pb for him) but move Amaryl only before lunch. He  usually wakes up, eats b'fast and goes back to bed for 5 h >> prefers to take the med with lunch rather than b'fast - We also discussed about the absolute need to change his diet: He needs to eliminate donuts and OG in the morning and 2 of the starches with lunch.  Also advised him against drinking cider. - I asked him to start checking his sugars twice a day and send them to me in 2 weeks so we can see whether he needs to add back insulin or not - I suggested to:   Patient Instructions  Please continue: - Metformin 1000 mg 2x a day with meals - Glimepiride 4 mg before lunch - 15 min before the meal  Stop OJ and doughnuts. Keep only 1 starch per meal.   Please send me the sugars in 2 weeks.  Please let me know if the sugars are consistently <80 or >200.  Please return in 1.5 months with your sugar log.   - Strongly advised him to start checking sugars at different times of the day - check 2 times a day, rotating checks - given sugar log and advised how to fill it and to bring it at next appt  - given foot care handout and explained the principles  - given instructions for hypoglycemia management "15-15 rule"  - advised for yearly eye exams  - Return to clinic in 1.5 mo with sugar log   Carlus Pavlov, MD PhD [facility] Endocrinology

## 2017-11-20 NOTE — Patient Instructions (Addendum)
Please continue: - Metformin 1000 mg 2x a day with meals - Glimepiride 4 mg before lunch - 15 min before the meal  Stop OJ and doughnuts. Keep only 1 starch per meal.   Please send me the sugars in 2 weeks.  Please let me know if the sugars are consistently <80 or >200.  Please return in 1.5 months with your sugar log.   PATIENT INSTRUCTIONS FOR TYPE 2 DIABETES:  **Please join MyChart!** - see attached instructions about how to join if you have not done so already.  DIET AND EXERCISE Diet and exercise is an important part of diabetic treatment.  We recommended aerobic exercise in the form of brisk walking (working between 40-60% of maximal aerobic capacity, similar to brisk walking) for 150 minutes per week (such as 30 minutes five days per week) along with 3 times per week performing 'resistance' training (using various gauge rubber tubes with handles) 5-10 exercises involving the major muscle groups (upper body, lower body and core) performing 10-15 repetitions (or near fatigue) each exercise. Start at half the above goal but build slowly to reach the above goals. If limited by weight, joint pain, or disability, we recommend daily walking in a swimming pool with water up to waist to reduce pressure from joints while allow for adequate exercise.    BLOOD GLUCOSES Monitoring your blood glucoses is important for continued management of your diabetes. Please check your blood glucoses 2-4 times a day: fasting, before meals and at bedtime (you can rotate these measurements - e.g. one day check before the 3 meals, the next day check before 2 of the meals and before bedtime, etc.).   HYPOGLYCEMIA (low blood sugar) Hypoglycemia is usually a reaction to not eating, exercising, or taking too much insulin/ other diabetes drugs.  Symptoms include tremors, sweating, hunger, confusion, headache, etc. Treat IMMEDIATELY with 15 grams of Carbs: . 4 glucose tablets .  cup regular juice/soda . 2  tablespoons raisins . 4 teaspoons sugar . 1 tablespoon honey Recheck blood glucose in 15 mins and repeat above if still symptomatic/blood glucose <100.  RECOMMENDATIONS TO REDUCE YOUR RISK OF DIABETIC COMPLICATIONS: * Take your prescribed MEDICATION(S) * Follow a DIABETIC diet: Complex carbs, fiber rich foods, (monounsaturated and polyunsaturated) fats * AVOID saturated/trans fats, high fat foods, >2,300 mg salt per day. * EXERCISE at least 5 times a week for 30 minutes or preferably daily.  * DO NOT SMOKE OR DRINK more than 1 drink a day. * Check your FEET every day. Do not wear tightfitting shoes. Contact us if you develop an ulcer * See your EYE doctor once a year or more if needed * Get a FLU shot once a year * Get a PNEUMONIA vaccine once before and once after age 39 years  GOALS:  * Your Hemoglobin A1c of <7%  * fasting sugars need to be <130 * after meals sugars need to be <180 (2h after you start eating) * Your Systolic BP should be 140 or lower  * Your Diastolic BP should be 80 or lower  * Your HDL (Good Cholesterol) should be 40 or higher  * Your LDL (Bad Cholesterol) should be 100 or lower. * Your Triglycerides should be 150 or lower  * Your Urine microalbumin (kidney function) should be <30 * Your Body Mass Index should be 25 or lower    Please consider the following ways to cut down carbs and fat and increase fiber and micronutrients in your diet: - substitute  whole grain for white bread or pasta - substitute brown rice for white rice - substitute 90-calorie flat bread pieces for slices of bread when possible - substitute sweet potatoes or yams for white potatoes - substitute humus for margarine - substitute tofu for cheese when possible - substitute almond or rice milk for regular milk (would not drink soy milk daily due to concern for soy estrogen influence on breast cancer risk) - substitute dark chocolate for other sweets when possible - substitute water - can  add lemon or orange slices for taste - for diet sodas (artificial sweeteners will trick your body that you can eat sweets without getting calories and will lead you to overeating and weight gain in the long run) - do not skip breakfast or other meals (this will slow down the metabolism and will result in more weight gain over time)  - can try smoothies made from fruit and almond/rice milk in am instead of regular breakfast - can also try old-fashioned (not instant) oatmeal made with almond/rice milk in am - order the dressing on the side when eating salad at a restaurant (pour less than half of the dressing on the salad) - eat as little meat as possible - can try juicing, but should not forget that juicing will get rid of the fiber, so would alternate with eating raw veg./fruits or drinking smoothies - use as little oil as possible, even when using olive oil - can dress a salad with a mix of balsamic vinegar and lemon juice, for e.g. - use agave nectar, stevia sugar, or regular sugar rather than artificial sweateners - steam or broil/roast veggies  - snack on veggies/fruit/nuts (unsalted, preferably) when possible, rather than processed foods - reduce or eliminate aspartame in diet (it is in diet sodas, chewing gum, etc) Read the labels!  Try to read Dr. Janene Harvey book: "Program for Reversing Diabetes" for other ideas for healthy eating.

## 2017-11-22 ENCOUNTER — Ambulatory Visit (INDEPENDENT_AMBULATORY_CARE_PROVIDER_SITE_OTHER): Payer: PPO | Admitting: Psychiatry

## 2017-11-22 ENCOUNTER — Encounter (HOSPITAL_COMMUNITY): Payer: Self-pay | Admitting: Psychiatry

## 2017-11-22 ENCOUNTER — Other Ambulatory Visit: Payer: Self-pay

## 2017-11-22 VITALS — BP 135/86 | HR 91 | Temp 98.6°F | Resp 15 | Wt 227.8 lb

## 2017-11-22 DIAGNOSIS — E119 Type 2 diabetes mellitus without complications: Secondary | ICD-10-CM | POA: Diagnosis not present

## 2017-11-22 DIAGNOSIS — Z79899 Other long term (current) drug therapy: Secondary | ICD-10-CM

## 2017-11-22 DIAGNOSIS — G4700 Insomnia, unspecified: Secondary | ICD-10-CM | POA: Diagnosis not present

## 2017-11-22 DIAGNOSIS — Z818 Family history of other mental and behavioral disorders: Secondary | ICD-10-CM

## 2017-11-22 DIAGNOSIS — F3160 Bipolar disorder, current episode mixed, unspecified: Secondary | ICD-10-CM

## 2017-11-22 DIAGNOSIS — Z87891 Personal history of nicotine dependence: Secondary | ICD-10-CM | POA: Diagnosis not present

## 2017-11-22 DIAGNOSIS — F4010 Social phobia, unspecified: Secondary | ICD-10-CM | POA: Diagnosis not present

## 2017-11-22 MED ORDER — LITHIUM CARBONATE 300 MG PO CAPS: 900.0000 mg | ORAL_CAPSULE | Freq: Every day | ORAL | 2 refills | Status: DC
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
## 2017-11-23 LAB — COMPREHENSIVE METABOLIC PANEL
A/G RATIO: 2.1 (ref 1.2–2.2)
ALBUMIN: 4.6 g/dL (ref 3.5–5.5)
ALK PHOS: 120 IU/L — AB (ref 39–117)
ALT: 51 IU/L — ABNORMAL HIGH (ref 0–44)
AST: 42 IU/L — ABNORMAL HIGH (ref 0–40)
BILIRUBIN TOTAL: 0.3 mg/dL (ref 0.0–1.2)
BUN / CREAT RATIO: 11 (ref 9–20)
BUN: 9 mg/dL (ref 6–24)
CO2: 20 mmol/L (ref 20–29)
CREATININE: 0.79 mg/dL (ref 0.76–1.27)
Calcium: 9.9 mg/dL (ref 8.7–10.2)
Chloride: 102 mmol/L (ref 96–106)
GFR calc Af Amer: 116 mL/min/{1.73_m2} (ref >59)
GFR calc non Af Amer: 100 mL/min/{1.73_m2} (ref >59)
GLOBULIN, TOTAL: 2.2 g/dL (ref 1.5–4.5)
Glucose: 290 mg/dL — ABNORMAL HIGH (ref 65–99)
POTASSIUM: 4.5 mmol/L (ref 3.5–5.2)
SODIUM: 140 mmol/L (ref 134–144)
TOTAL PROTEIN: 6.8 g/dL (ref 6.0–8.5)

## 2017-11-23 LAB — TSH: TSH: 2.84 u[IU]/mL (ref 0.450–4.500)

## 2017-11-23 LAB — LITHIUM LEVEL: Lithium Lvl: 0.7 mmol/L (ref 0.6–1.2)

## 2017-11-29 ENCOUNTER — Encounter: Payer: Self-pay | Admitting: Primary Care

## 2017-11-29 DIAGNOSIS — R7989 Other specified abnormal findings of blood chemistry: Secondary | ICD-10-CM

## 2017-11-29 DIAGNOSIS — R945 Abnormal results of liver function studies: Principal | ICD-10-CM

## 2017-12-07 ENCOUNTER — Telehealth: Payer: Self-pay | Admitting: Primary Care

## 2017-12-07 NOTE — Telephone Encounter (Signed)
Copied from CRM [id number]. Topic: Quick Communication - See Telephone Encounter >> Dec 07, 2017  4:40 PM Rudi Coco, [place] wrote: CRM for notification. See Telephone encounter for: 12/07/17.  Pt. Is requesting to change providers. He is formally a pt. Of Vernona Rieger and would like to become a new pt. Renne Kuneff. Pt. Is only requesting a male MD. And would prefer not to discuss reason why

## 2017-12-10 NOTE — Telephone Encounter (Signed)
Fine with me, thanks. 

## 2017-12-10 NOTE — Telephone Encounter (Signed)
Ok with me 

## 2017-12-11 ENCOUNTER — Telehealth: Payer: Self-pay | Admitting: Primary Care

## 2017-12-11 NOTE — Telephone Encounter (Signed)
Noted, [place] cancelled.

## 2017-12-11 NOTE — Telephone Encounter (Addendum)
We have called the patient three times to help get the Abdominal US scheduled and patient has not returned our calls. Patient is now switching Primary Care Providers to Dr Claiborne Billings per telephone note in Epic. Please cancel the [place] now.

## 2017-12-11 NOTE — Telephone Encounter (Signed)
Patient is scheduled for 12/19/17

## 2017-12-18 ENCOUNTER — Encounter: Payer: Self-pay | Admitting: Internal Medicine

## 2017-12-18 ENCOUNTER — Ambulatory Visit (INDEPENDENT_AMBULATORY_CARE_PROVIDER_SITE_OTHER): Payer: PPO | Admitting: Internal Medicine

## 2017-12-18 VITALS — BP 142/86 | HR 93 | Ht 76.0 in | Wt 227.0 lb

## 2017-12-18 DIAGNOSIS — E1165 Type 2 diabetes mellitus with hyperglycemia: Secondary | ICD-10-CM

## 2017-12-18 DIAGNOSIS — E785 Hyperlipidemia, unspecified: Secondary | ICD-10-CM

## 2017-12-18 DIAGNOSIS — E1142 Type 2 diabetes mellitus with diabetic polyneuropathy: Secondary | ICD-10-CM | POA: Diagnosis not present

## 2017-12-18 DIAGNOSIS — IMO0002 Reserved for concepts with insufficient information to code with codable children: Secondary | ICD-10-CM

## 2017-12-18 LAB — POCT GLYCOSYLATED HEMOGLOBIN (HGB A1C): Hemoglobin A1C: 10.3 % — AB (ref 4.0–5.6)

## 2017-12-18 NOTE — Patient Instructions (Signed)
Please continue: - Metformin 1000 mg 2x a day with meals - Glimepiride 4 mg before lunch  Please increase: - Novolin NPH to 20 units in am and 15 units at bedtime  Please return in 3 months with your sugar log.

## 2017-12-18 NOTE — Progress Notes (Signed)
Patient ID: Willie Hughes, male   DOB: Nov 30, 1960, 57 y.o.   MRN: [id number]   HPI: Willie Hughes is a 57 y.o.-year-old male, returning for follow-up for DM2, dx in 1999 - after Clozapine tx, insulin-dependent since 2018, uncontrolled, with complications (PN, ED).  Last visit less than a month ago.  Last hemoglobin A1c was: Lab Results  Component Value Date   HGBA1C 9.7 (H) 09/26/2017   HGBA1C 10.3 (H) 04/23/2017   HGBA1C 10.6 (H) 02/01/2017   Pt is on a regimen of: - Metformin 1000 mg 2X a day, with meals - Glimepiride 4 mg before lunch - Novolin NPH 15 units 2x a day - restarted 3 weeks ago, taking it after B and D  At last visit, he was not checking sugars.  We could not change his regimen but I advised him to eliminate OJ and donuts. He stopped some of them.  He is now checking his sugars 0-1x a day - per his meter download: - am: n/xc >> 121-241, 311 - 2h after b'fast: 245 - before lunch: n/c >> 230-393 - 2h after lunch: n/c >> 204-322 - before dinner: n/c >> 230-251, 342 - 2h after dinner: n/c - bedtime: n/c - nighttime: n/c  Lowest: 120 Highest: 400s  Glucometer:  One Touch ultra  Pt's meals are: - Breakfast: OJ, yoghurt, 2 donuts, canned or fresh fruit - Lunch: 2-3 hot dogs + chips; 1 can of chilli; sandwich + chips - Dinner: meat + starch + salad/veggies - Snacks: diet ice tea, diet soda, cider  -No CKD, last BUN/creatinine:  Lab Results  Component Value Date   BUN 9 11/22/2017   BUN 8 09/26/2017   CREATININE 0.79 11/22/2017   CREATININE 0.76 09/26/2017   -+ HL; last set of lipids: Lab Results  Component Value Date   CHOL 140 09/26/2017   HDL 32.60 (L) 09/26/2017   LDLCALC 54 10/12/2016   LDLDIRECT 73.0 09/26/2017   TRIG 334.0 (H) 09/26/2017   CHOLHDL 4 09/26/2017  On simvastatin 40, fenofibrate 150.  - last eye exam was on 10/2016: No DR  - + numbness and tingling in his feet.  Last foot exam by PCP was in 09/28/2017.  Pt has FH of DM in mother.   She  also has a history of hypothyroidism 2/2 Lithium tx, on LT4 75 mcg daily.  Reviewed TFTs: Lab Results  Component Value Date   TSH 2.840 11/22/2017   TSH 3.01 11/01/2017   TSH 5.49 (H) 09/26/2017   TSH 5.020 (H) 04/18/2017   TSH 5.08 (H) 03/09/2017   Psychiatrist: Dr. Michae Kava  Stopped Ca, vit D. Decreased vit C dose.  ROS: Constitutional: no weight gain/no weight loss, no fatigue, no subjective hyperthermia, + subjective hypothermia Eyes: no blurry vision, no xerophthalmia ENT: no sore throat, no nodules palpated in throat, no dysphagia, no odynophagia, + hoarseness Cardiovascular: no CP/no SOB/no palpitations/no leg swelling Respiratory: no cough/no SOB/no wheezing Gastrointestinal: no N/no V/no D/no C/no acid reflux Musculoskeletal: no muscle aches/no joint aches Skin: + rash, no hair loss Neurological: + tremors/+ numbness/+ tingling/no dizziness + diff with erections  I reviewed pt's medications, allergies, PMH, social hx, family hx, and changes were documented in the history of present illness. Otherwise, unchanged from my initial visit note.  Past Medical History:  Diagnosis Date  . Acne   . Allergic rhinitis   . Bipolar disorder (HCC)   . Erectile dysfunction   . Hyperlipidemia   . Hypothyroidism   . Type 2  diabetes mellitus (HCC)    Past Surgical History:  Procedure Laterality Date  . WISDOM TOOTH EXTRACTION     Social History   Socioeconomic History  . Marital status: Single    Spouse name: Not on file  . Number of children: 0  . Years of education: Not on file  . Highest education level: Not on file  Occupational History  . Not on file  Social Needs  . Financial resource strain: Not on file  . Food insecurity:    Worry: Not on file    Inability: Not on file  . Transportation needs:    Medical: Not on file    Non-medical: Not on file  Tobacco Use  . Smoking status: Former Smoker    Types: E-cigarettes  . Smokeless tobacco: Never Used  . Tobacco  comment: Vapor  Substance and Sexual Activity  . Alcohol use: Yes    Alcohol/week: 0.0 oz    Comment: a few beers a month  . Drug use: Yes    Types: Marijuana    Comment: over 69yr ago was last use THC  . Sexual activity: Never  Lifestyle  . Physical activity:    Days per week: Not on file    Minutes per session: Not on file  . Stress: Not on file  Relationships  . Social connections:    Talks on phone: Not on file    Gets together: Not on file    Attends religious service: Not on file    Active member of club or organization: Not on file    Attends meetings of clubs or organizations: Not on file    Relationship status: Not on file  . Intimate partner violence:    Fear of current or ex partner: Not on file    Emotionally abused: Not on file    Physically abused: Not on file    Forced sexual activity: Not on file  Other Topics Concern  . Not on file  Social History Narrative   Single and has one cat. Living in [place]. Never married. No kids.    Born and raised in [place] by parents. 2 sisters and 1 brother and pt is the youngest. Pt has 2 associates degrees in Sports administrator   Disabled for Bipolar disorder.  Last worked 2008    Enjoys watching TV, playing on the Internet.   Current Outpatient Medications on File Prior to Visit  Medication Sig Dispense Refill  . acetaminophen (TYLENOL) 500 MG tablet Take 1,000 mg by mouth 3 (three) times daily.     Marland Kitchen albuterol (PROVENTIL HFA;VENTOLIN HFA) 108 (90 Base) MCG/ACT inhaler Inhale 2 puffs into the lungs every 4 (four) hours as needed for wheezing or shortness of breath. 1 Inhaler 0  . Ascorbic Acid (VITAMIN C) 1000 MG tablet Take 1,000 mg by mouth daily.    Marland Kitchen aspirin 325 MG tablet Take 325 mg by mouth daily.    . Calcium Carbonate-Vitamin D3 (CALCIUM 600-D) 600-400 MG-UNIT TABS Take 1 tablet by mouth daily.     . Fenofibrate 150 MG CAPS Take 1 capsule (150 mg total) by mouth daily. 90 each 3  . fexofenadine (ALLEGRA)  180 MG tablet Take 180 mg by mouth 2 (two) times daily.     Marland Kitchen glimepiride (AMARYL) 4 MG tablet Take 1 tablet (4 mg total) by mouth daily. 90 tablet 3  . glucose blood (ONE TOUCH TEST STRIPS) test strip Use as instructed to test blood sugar 3 times  daily 300 each 2  . GuaiFENesin (MUCINEX PO) Take 1,200 mg by mouth 2 (two) times daily.    . insulin NPH Human (HUMULIN N,NOVOLIN N) 100 UNIT/ML injection Inject 15 Units into the skin 2 (two) times daily.    . Insulin Pen Needle (COMFORT EZ PEN NEEDLES) 31G X 6 MM MISC Use with Lantus Insulin once every evening. 100 each 11  . Insulin Syringe-Needle U-100 (RELION INSULIN SYRINGE 1ML/31G) 31G X 5/16" 1 ML MISC Use with insulin twice daily as directed. 100 each 1  . levothyroxine (SYNTHROID, LEVOTHROID) 75 MCG tablet TAKE ONE TABLET BY MOUTH EVERY MORNING ON AN EMPTY STOMACH WITH A FULL GLASS OF WATER 30 tablet 2  . lithium carbonate 300 MG capsule Take 3 capsules (900 mg total) by mouth daily. 90 capsule 2  . metFORMIN (GLUCOPHAGE) 1000 MG tablet TAKE ONE TABLET BY MOUTH TWICE A DAY WITH FOOD 180 tablet 0  . Multiple Vitamins-Minerals (CENTRUM SILVER) tablet Take 1 tablet by mouth daily.     . Omega-3 Fatty Acids (EQL FISH OIL PO) Take 1,000 mg by mouth 3 (three) times daily.     Letta Pate DELICA LANCETS 33G MISC Use as instructed to test blood sugar 3 times daily 300 each 1  . pseudoephedrine (SUDAFED) 120 MG 12 hr tablet Take 120 mg by mouth 2 (two) times daily.     . ranitidine (ZANTAC) 150 MG tablet Take 150 mg by mouth 2 (two) times daily.    . sildenafil (VIAGRA) 100 MG tablet Take 1 tablet (100 mg total) by mouth daily as needed for erectile dysfunction. 16 tablet 5  . simvastatin (ZOCOR) 40 MG tablet TAKE ONE TABLET BY MOUTH EVERY NIGHT AT BEDTIME 90 tablet 1  . tretinoin (RETIN-A) 0.05 % cream APPLY  CREAM TOPICALLY TO AFFECTED AREA AS NEEDED 45 g 0   No current facility-administered medications on file prior to visit.    Allergies   Allergen Reactions  . Kava Kava   . Clindamycin Rash    Drug eruption - docy v clinda  . Doxycycline Rash  . Other Rash    Some antiperspirants cause a rash   Family History  Problem Relation Age of Onset  . Heart attack Father   . Stroke Father   . Stroke Mother   . Diabetes Mother   . Depression Mother   . Depression Sister   . Bipolar disorder Paternal Aunt   . Lupus Sister     PE: BP (!) 142/86 (BP Location: Left Arm, Patient Position: Sitting, Cuff Size: Normal)   Pulse 93   Ht  (1.93 m)   Wt 227 lb (103 kg)   SpO2 96%   BMI 27.63 kg/m  Wt Readings from Last 3 Encounters:  12/18/17 227 lb (103 kg)  11/20/17 228 lb 3.2 oz (103.5 kg)  09/28/17 230 lb (104.3 kg)   Constitutional: overweight, in NAD Eyes: PERRLA, EOMI, no exophthalmos ENT: moist mucous membranes, no thyromegaly, no cervical lymphadenopathy Cardiovascular: tachycardia, RR, No MRG Respiratory: CTA B Gastrointestinal: abdomen soft, NT, ND, BS+ Musculoskeletal: no deformities, strength intact in all 4 Skin: moist, warm, no rashes Neurological: + tremor with outstretched hands, DTR normal in all 4  ASSESSMENT: 1. DM2, insulin-dependent, uncontrolled, with complications - PN - ED  2. HL  PLAN:  1. Patient with long-standing, uncontrolled, type 2 diabetes, on oral antidiabetic regimen and previously intermediate acting insulin after being in the donut hole last year.  However, he was on  a high dose that he developed hypoglycemia so he stopped 1 to 2 weeks prior to our last appointment a month ago.  Unfortunately, he stopped checking sugars so I could not restart his insulin but discussed about the importance of improving his diet by cutting out sweets.  We continued metformin and Amaryl.  Unfortunately, finances are a significant problem for him so we have to use tier 1 medicines, when possible. -I advised him to take Amaryl before lunch (he usually wakes up to eat breakfast and then goes to bed  for 5 hours so he prefers to take the medication with lunch rather than breakfast).  He is now taking the Amaryl with lunch.  He is afraid to take the medication before a meal in case he ends up not eating the meal. -At this visit, he tells me that his sugars started to increase after our last visit, when he started to check them.  He restarted NPH, currently at 15 units after breakfast and at bedtime. -Reviewing the meter downloads (3 hours behind), his sugars are high in the morning, but occasionally at goal, but always higher later in the day.  We discussed about increasing NPH in the morning and keeping the bedtime dose at 15 units.  I again strongly advised him to stop the concentrated sweets and we discussed about replacing these with other foods. - I suggested to:  Patient Instructions  Please continue: - Metformin 1000 mg 2x a day with meals - Glimepiride 4 mg before lunch  Please increase: - Novolin NPH to 20 units in am and 15 units at bedtime  Please return in 3 months with your sugar log.   - today, HbA1c is 10.3% (higher) - continue checking sugars at different times of the day - check 2x a day, rotating checks - advised for yearly eye exams >> he is not UTD - Return to clinic in 3 mo with sugar log   2. HL - Reviewed latest lipid panel from 09/2017: LDL higher but still at goal, TG high, HDL low - Continues Simvastatin and fenofibrate without side effects.  Willie Pavlov, MD PhD [facility] End[facility]

## 2017-12-19 ENCOUNTER — Encounter: Payer: Self-pay | Admitting: Internal Medicine

## 2017-12-19 ENCOUNTER — Ambulatory Visit (INDEPENDENT_AMBULATORY_CARE_PROVIDER_SITE_OTHER): Payer: PPO | Admitting: Family Medicine

## 2017-12-19 ENCOUNTER — Encounter: Payer: Self-pay | Admitting: Family Medicine

## 2017-12-19 VITALS — BP 164/92 | HR 88 | Temp 98.3°F | Resp 20 | Ht 76.0 in | Wt 225.4 lb

## 2017-12-19 DIAGNOSIS — F3174 Bipolar disorder, in full remission, most recent episode manic: Secondary | ICD-10-CM | POA: Diagnosis not present

## 2017-12-19 DIAGNOSIS — R748 Abnormal levels of other serum enzymes: Secondary | ICD-10-CM

## 2017-12-19 DIAGNOSIS — E1142 Type 2 diabetes mellitus with diabetic polyneuropathy: Secondary | ICD-10-CM

## 2017-12-19 DIAGNOSIS — R1011 Right upper quadrant pain: Secondary | ICD-10-CM | POA: Diagnosis not present

## 2017-12-19 DIAGNOSIS — E785 Hyperlipidemia, unspecified: Secondary | ICD-10-CM

## 2017-12-19 DIAGNOSIS — E1165 Type 2 diabetes mellitus with hyperglycemia: Secondary | ICD-10-CM | POA: Diagnosis not present

## 2017-12-19 DIAGNOSIS — E119 Type 2 diabetes mellitus without complications: Secondary | ICD-10-CM

## 2017-12-19 DIAGNOSIS — IMO0002 Reserved for concepts with insufficient information to code with codable children: Secondary | ICD-10-CM

## 2017-12-19 NOTE — Progress Notes (Signed)
Patient ID: Willie Hughes, male  DOB: 13-Aug-1960, 57 y.o.   MRN: [id number] Patient Care Team    Relationship Specialty Notifications Start End  Ma Hillock, DO PCP - General Family Medicine  12/19/17   Philemon Kingdom, MD Consulting Physician Endocrinology  12/24/17     Chief Complaint  Patient presents with  . Establish Care    Subjective:  Willie Hughes is a 57 y.o.  male present for new patient establishment/transfer of care from prior [place] practice All past medical history, surgical history, allergies, family history, immunizations, medications and social history were updated in the electronic medical record today. All recent labs, ED visits and hospitalizations within the last year were reviewed.  elevated LFT: Patient presents for new patient establishment with concerns over his elevated LFTs.  Collection 11/22/2017 resulted with new onset elevated liver function panel with alk phos 120, AST 42, ALT 51.  Patient reports he has cut back on his vitamin D, calcium and Centrum multivitamin.  He is also decreased his vitamin C since that time.  He was also using Tylenol frequently and has decreased his Tylenol use.  He denies any abdominal discomfort or changes in bowel habits.  He denies fever, chills, nausea or vomit.  Depression screen [facility] 2/9 12/19/2017  Decreased Interest 0  Down, Depressed, Hopeless 0  PHQ - 2 Score 0   No flowsheet data found.    Exercise limited by: None identified Fall Risk  12/19/2017  Falls in the past year? No     Immunization History  Administered Date(s) Administered  . Influenza, Seasonal, Injecte, Preservative Fre 09/24/2015  . Influenza,inj,Quad PF,6+ Mos 05/05/2014, 06/19/2016, 05/21/2017  . Pneumococcal Polysaccharide-23 02/17/2013  . Tdap 06/05/2014    No exam data present  Past Medical History:  Diagnosis Date  . Acne   . Allergic rhinitis   . Allergy   . Bipolar disorder ([place])   . COPD (chronic obstructive pulmonary  disease) ([place])   . Erectile dysfunction   . Frequent headaches   . Heart disease   . Heart murmur   . Hyperlipidemia   . Hypothyroidism   . Type 2 diabetes mellitus (HCC)    Allergies  Allergen Reactions  . Kava Kava   . Other Rash    Some antiperspirants cause a rash   Past Surgical History:  Procedure Laterality Date  . WISDOM TOOTH EXTRACTION     Family History  Problem Relation Age of Onset  . Heart attack Father   . Stroke Father   . Mental illness Father   . Heart disease Father   . Drug abuse Father   . Depression Father   . Alcohol abuse Father   . Arthritis Father   . Stroke Mother   . Diabetes Mother   . Depression Mother   . Arthritis Mother   . Mental illness Mother   . Depression Sister   . Lupus Sister   . Bipolar disorder Paternal Aunt   . Lupus Sister   . Lupus Brother    Social History   Socioeconomic History  . Marital status: Single    Spouse name: Not on file  . Number of children: 0  . Years of education: Not on file  . Highest education level: Not on file  Occupational History  . Not on file  Social Needs  . Financial resource strain: Not on file  . Food insecurity:    Worry: Not on file    Inability: Not  on file  . Transportation needs:    Medical: Not on file    Non-medical: Not on file  Tobacco Use  . Smoking status: Former Smoker    Types: E-cigarettes  . Smokeless tobacco: Never Used  . Tobacco comment: Vapor  Substance and Sexual Activity  . Alcohol use: Yes    Alcohol/week: 0.0 oz    Comment: a few beers a month  . Drug use: Not Currently    Comment: prior h/o MJ use  . Sexual activity: Not Currently  Lifestyle  . Physical activity:    Days per week: Not on file    Minutes per session: Not on file  . Stress: Not on file  Relationships  . Social connections:    Talks on phone: Not on file    Gets together: Not on file    Attends religious service: Not on file    Active member of club or organization: Not on  file    Attends meetings of clubs or organizations: Not on file    Relationship status: Not on file  . Intimate partner violence:    Fear of current or ex partner: Not on file    Emotionally abused: Not on file    Physically abused: Not on file    Forced sexual activity: Not on file  Other Topics Concern  . Not on file  Social History Narrative   Single and has one cat. Living in [place]. Never married. No kids.    Born and raised in [place] by parents. 2 sisters and 1 brother and pt is the youngest. Pt has 2 associates degrees in Loss adjuster, chartered   Disabled for Bipolar disorder.  Last worked 2008    Enjoys watching TV, playing on the Internet.   Allergies as of 12/19/2017      Reactions   Kava Kava    Other Rash   Some antiperspirants cause a rash      Medication List        Accurate as of 12/19/17 11:59 PM. Always use your most recent med list.          acetaminophen 500 MG tablet Commonly known as:  TYLENOL Take 1,000 mg by mouth 3 (three) times daily.   albuterol 108 (90 Base) MCG/ACT inhaler Commonly known as:  PROVENTIL HFA;VENTOLIN HFA Inhale 2 puffs into the lungs every 4 (four) hours as needed for wheezing or shortness of breath.   aspirin 325 MG tablet Take 325 mg by mouth daily.   EQL FISH OIL PO Take 1,000 mg by mouth 3 (three) times daily.   Fenofibrate 150 MG Caps Take 1 capsule (150 mg total) by mouth daily.   fexofenadine 180 MG tablet Commonly known as:  ALLEGRA Take 180 mg by mouth 2 (two) times daily.   glimepiride 4 MG tablet Commonly known as:  AMARYL Take 1 tablet (4 mg total) by mouth daily.   glucose blood test strip Commonly known as:  ONE TOUCH TEST STRIPS Use as instructed to test blood sugar 3 times daily   insulin NPH Human 100 UNIT/ML injection Commonly known as:  HUMULIN N,NOVOLIN N Inject 15-20 Units into the skin 2 (two) times daily.   Insulin Pen Needle 31G X 6 MM Misc Commonly known as:  COMFORT EZ PEN  NEEDLES Use with Lantus Insulin once every evening.   Insulin Syringe-Needle U-100 31G X 5/16" 1 ML Misc Commonly known as:  RELION INSULIN SYRINGE 1ML/31G Use with insulin twice daily  as directed.   levothyroxine 75 MCG tablet Commonly known as:  SYNTHROID, LEVOTHROID TAKE ONE TABLET BY MOUTH EVERY MORNING ON AN EMPTY STOMACH WITH A FULL GLASS OF WATER   lithium carbonate 300 MG capsule Take 3 capsules (900 mg total) by mouth daily.   metFORMIN 1000 MG tablet Commonly known as:  GLUCOPHAGE TAKE ONE TABLET BY MOUTH TWICE A DAY WITH FOOD   ONETOUCH DELICA LANCETS 99B Misc Use as instructed to test blood sugar 3 times daily   ranitidine 150 MG tablet Commonly known as:  ZANTAC Take 150 mg by mouth 2 (two) times daily.   sildenafil 100 MG tablet Commonly known as:  VIAGRA Take 1 tablet (100 mg total) by mouth daily as needed for erectile dysfunction.   simvastatin 40 MG tablet Commonly known as:  ZOCOR TAKE ONE TABLET BY MOUTH EVERY NIGHT AT BEDTIME   tretinoin 0.05 % cream Commonly known as:  RETIN-A APPLY  CREAM TOPICALLY TO AFFECTED AREA AS NEEDED   vitamin C 1000 MG tablet Take 500 mg by mouth daily.       All past medical history, surgical history, allergies, family history, immunizations andmedications were updated in the EMR today and reviewed under the history and medication portions of their EMR.    Recent Results (from the past 2160 hour(s))  Comprehensive metabolic panel     Status: Abnormal   Collection Time: 09/26/17  8:39 AM  Result Value Ref Range   Sodium 136 135 - 145 mEq/L   Potassium 3.6 3.5 - 5.1 mEq/L   Chloride 98 96 - 112 mEq/L   CO2 30 19 - 32 mEq/L   Glucose, Bld 175 (H) 70 - 99 mg/dL   BUN 8 6 - 23 mg/dL   Creatinine, Ser 0.76 0.40 - 1.50 mg/dL   Total Bilirubin 0.4 0.2 - 1.2 mg/dL   Alkaline Phosphatase 79 39 - 117 U/L   AST 42 (H) 0 - 37 U/L   ALT 43 0 - 53 U/L   Total Protein 7.5 6.0 - 8.3 g/dL   Albumin 4.7 3.5 - 5.2 g/dL    Calcium 10.8 (H) 8.4 - 10.5 mg/dL   GFR 112.63 >60.00 mL/min  Hemoglobin A1c     Status: Abnormal   Collection Time: 09/26/17  8:39 AM  Result Value Ref Range   Hgb A1c MFr Bld 9.7 (H) 4.6 - 6.5 %    Comment: Glycemic Control Guidelines for People with Diabetes:Non Diabetic:  <6%Goal of Therapy: <7%Additional Action Suggested:  >8%   Lipid panel     Status: Abnormal   Collection Time: 09/26/17  8:39 AM  Result Value Ref Range   Cholesterol 140 0 - 200 mg/dL    Comment: ATP III Classification       Desirable:  < 200 mg/dL               Borderline High:  200 - 239 mg/dL          High:  > = 240 mg/dL   Triglycerides 334.0 (H) 0.0 - 149.0 mg/dL    Comment: Normal:  <150 mg/dLBorderline High:  150 - 199 mg/dL   HDL 32.60 (L) >39.00 mg/dL   VLDL 66.8 (H) 0.0 - 40.0 mg/dL   Total CHOL/HDL Ratio 4     Comment:                Men          Women1/2 Average Risk     3.4  3.3Average Risk          5.0          4.42X Average Risk          9.6          7.13X Average Risk          15.0          11.0                       NonHDL 107.76     Comment: NOTE:  Non-HDL goal should be 30 mg/dL higher than patient's LDL goal (i.e. LDL goal of < 70 mg/dL, would have non-HDL goal of < 100 mg/dL)  T4, free     Status: None   Collection Time: 09/26/17  8:39 AM  Result Value Ref Range   Free T4 0.90 0.60 - 1.60 ng/dL    Comment: Specimens from patients who are undergoing biotin therapy and /or ingesting biotin supplements may contain high levels of biotin.  The higher biotin concentration in these specimens interferes with this Free T4 assay.  Specimens that contain high levels  of biotin may cause false high results for this Free T4 assay.  Please interpret results in light of the total clinical presentation of the patient.    TSH     Status: Abnormal   Collection Time: 09/26/17  8:39 AM  Result Value Ref Range   TSH 5.49 (H) 0.35 - 4.50 uIU/mL  LDL cholesterol, direct     Status: None   Collection Time:  09/26/17  8:39 AM  Result Value Ref Range   Direct LDL 73.0 mg/dL    Comment: Optimal:  <100 mg/dLNear or Above Optimal:  100-129 mg/dLBorderline High:  130-159 mg/dLHigh:  160-189 mg/dLVery High:  >190 mg/dL  Microalbumin / creatinine urine ratio     Status: None   Collection Time: 09/28/17  4:21 PM  Result Value Ref Range   Creatinine, Urine 81 20 - 320 mg/dL   Microalb, Ur 0.9 mg/dL    Comment: Reference Range Not established    Microalb Creat Ratio 11 <30 mcg/mg creat    Comment: . The ADA defines abnormalities in albumin excretion as follows: Marland Kitchen Category         Result (mcg/mg creatinine) . Normal                    <30 Microalbuminuria         30-299  Clinical albuminuria   > OR = 300 . The ADA recommends that at least two of three specimens collected within a 3-6 month period be abnormal before considering a patient to be within a diagnostic category.   TSH     Status: None   Collection Time: 11/01/17  3:04 PM  Result Value Ref Range   TSH 3.01 0.35 - 4.50 uIU/mL  ANA     Status: None   Collection Time: 11/01/17  3:04 PM  Result Value Ref Range   Anit Nuclear Antibody(ANA) NEGATIVE NEGATIVE    Comment: ANA IFA is a first line screen for detecting the presence of up to approximately 150 autoantibodies in various autoimmune diseases. A negative ANA IFA result suggests ANA-associated autoimmune diseases are not present at this time. . Visit Physician FAQs for interpretation of all antibodies in the [place], prevalence, and association with diseases at http://education.[facility].com/ BPZ/WCH852 .   Sedimentation rate     Status: None   Collection Time: 11/01/17  3:04 PM  Result Value Ref Range   Sed Rate 9 0 - 20 mm/hr  CBC     Status: None   Collection Time: 11/01/17  3:04 PM  Result Value Ref Range   WBC 8.0 4.0 - 10.5 K/uL   RBC 5.03 4.22 - 5.81 Mil/uL   Platelets 331.0 150.0 - 400.0 K/uL   Hemoglobin 15.4 13.0 - 17.0 g/dL   HCT 46.1 39.0 - 52.0  %   MCV 91.7 78.0 - 100.0 fl   MCHC 33.4 30.0 - 36.0 g/dL   RDW 12.6 11.5 - 15.5 %  POCT urinalysis dipstick     Status: None   Collection Time: 11/01/17  3:44 PM  Result Value Ref Range   Color, UA yellow    Clarity, UA clear    Glucose, UA 3+     Comment: 1000 mg/dl   Bilirubin, UA neg    Ketones, UA neg    Spec Grav, UA 1.015 1.010 - 1.025   Blood, UA neg    pH, UA 6.0 5.0 - 8.0   Protein, UA neg    Urobilinogen, UA 0.2 0.2 or 1.0 E.U./dL   Nitrite, UA neg    Leukocytes, UA Negative Negative   Appearance     Odor    Comprehensive metabolic panel     Status: Abnormal   Collection Time: 11/22/17  2:30 PM  Result Value Ref Range   Glucose 290 (H) 65 - 99 mg/dL   BUN 9 6 - 24 mg/dL   Creatinine, Ser 0.79 0.76 - 1.27 mg/dL   GFR calc non Af Amer 100 >59 mL/min/1.73   GFR calc Af Amer 116 >59 mL/min/1.73   BUN/Creatinine Ratio 11 9 - 20   Sodium 140 134 - 144 mmol/L   Potassium 4.5 3.5 - 5.2 mmol/L   Chloride 102 96 - 106 mmol/L   CO2 20 20 - 29 mmol/L   Calcium 9.9 8.7 - 10.2 mg/dL   Total Protein 6.8 6.0 - 8.5 g/dL   Albumin 4.6 3.5 - 5.5 g/dL   Globulin, Total 2.2 1.5 - 4.5 g/dL   Albumin/Globulin Ratio 2.1 1.2 - 2.2   Bilirubin Total 0.3 0.0 - 1.2 mg/dL   Alkaline Phosphatase 120 (H) 39 - 117 IU/L   AST 42 (H) 0 - 40 IU/L   ALT 51 (H) 0 - 44 IU/L  TSH     Status: None   Collection Time: 11/22/17  2:30 PM  Result Value Ref Range   TSH 2.840 0.450 - 4.500 uIU/mL  Lithium level     Status: None   Collection Time: 11/22/17  2:30 PM  Result Value Ref Range   Lithium Lvl 0.7 0.6 - 1.2 mmol/L    Comment:                                  Detection Limit = 0.1                           <0.1 indicates None Detected   POCT glycosylated hemoglobin (Hb A1C)     Status: Abnormal   Collection Time: 12/18/17 10:25 AM  Result Value Ref Range   Hemoglobin A1C 10.3 (A) 4.0 - 5.6 %   HbA1c, POC (prediabetic range)  5.7 - 6.4 %   HbA1c, POC (controlled diabetic range)  0.0 -  7.0 %  Comp  Met (CMET)     Status: Abnormal   Collection Time: 12/19/17  3:47 PM  Result Value Ref Range   Sodium 136 135 - 145 mEq/L   Potassium 4.2 3.5 - 5.1 mEq/L   Chloride 100 96 - 112 mEq/L   CO2 26 19 - 32 mEq/L   Glucose, Bld 277 (H) 70 - 99 mg/dL   BUN 9 6 - 23 mg/dL   Creatinine, Ser 0.86 0.40 - 1.50 mg/dL   Total Bilirubin 0.4 0.2 - 1.2 mg/dL   Alkaline Phosphatase 80 39 - 117 U/L   AST 33 0 - 37 U/L   ALT 33 0 - 53 U/L   Total Protein 7.5 6.0 - 8.3 g/dL   Albumin 4.7 3.5 - 5.2 g/dL   Calcium 10.2 8.4 - 10.5 mg/dL   GFR 97.58 >60.00 mL/min  HIV antibody (with reflex)     Status: None   Collection Time: 12/19/17  3:47 PM  Result Value Ref Range   HIV 1&2 Ab, 4th Generation NON-REACTIVE NON-REACTI    Comment: HIV-1 antigen and HIV-1/HIV-2 antibodies were not detected. There is no laboratory evidence of HIV infection. Marland Kitchen PLEASE NOTE: This information has been disclosed to you from records whose confidentiality may be protected by state law.  If your state requires such protection, then the state law prohibits you from making any further disclosure of the information without the specific written consent of the person to whom it pertains, or as otherwise permitted by law. A general authorization for the release of medical or other information is NOT sufficient for this purpose. . For additional information please refer to http://education.[facility].com/faq/FAQ106 (This link is being provided for informational/ educational purposes only.) . Marland Kitchen The performance of this assay has not been clinically validated in patients less than 93 years old. .   Hepatitis, Acute     Status: None   Collection Time: 12/19/17  3:47 PM  Result Value Ref Range   Hep A IgM NON-REACTIVE NON-REACTI   Hepatitis B Surface Ag NON-REACTIVE NON-REACTI   Hep B C IgM NON-REACTIVE NON-REACTI   Hepatitis C Ab NON-REACTIVE NON-REACTI   SIGNAL TO CUT-OFF 0.02 <1.00    Comment: . HCV  antibody was non-reactive. There is no laboratory  evidence of HCV infection. . In most cases, no further action is required. However, if recent HCV exposure is suspected, a test for HCV RNA (test code [phone]) is suggested. . For additional information please refer to http://education.[facility].com/faq/FAQ22v1 (This link is being provided for informational/ educational purposes only.) .     No results found.   ROS: 14 pt review of systems performed and negative (unless mentioned in an HPI)  Objective: BP (!) 164/92 (BP Location: Left Arm, Patient Position: Sitting, Cuff Size: Large)   Pulse 88   Temp 98.3 F (36.8 C)   Resp 20   Ht '6\' 4"'  (1.93 m)   Wt 225 lb 6.4 oz (102.2 kg)   SpO2 95%   BMI 27.44 kg/m  Gen: Afebrile. No acute distress. Nontoxic in appearance, well-developed, well-nourished, Caucasian male. HENT: AT. [place].  MMM. no Cough on exam, no hoarseness on exam. Eyes:Pupils Equal Round Reactive to light, Extraocular movements intact,  Conjunctiva without redness, discharge or icterus. Neck/lymp/endocrine: Supple,no lymphadenopathy, no thyromegaly CV: RRR no murmur, no edema Chest: CTAB, no wheeze, rhonchi or crackles.  Abd: Soft.  Flat. ND.  Mild tenderness RUQ. BS present.  No masses palpated. No hepatosplenomegaly. No rebound tenderness or guarding. Skin: no rashes,  purpura or petechiae. Warm and well-perfused. Skin intact. Neuro/Msk:  Normal gait. PERLA. EOMi. Alert. Oriented x3.     Assessment/plan: Willie Hughes is a 57 y.o. male present for  Elevated liver enzymes Elevated liver enzymes/hyperlipidemia -Discussed different causes of liver enzymes including medications, fatty liver disease, infectious disease or autoimmune disorders.  He has had a recent ANA that was normal. -Repeat CMP today.  He has decreased some of his Tylenol use. -Hepatitis panel and HIV collected today. -He had mild tenderness on Murphy's test, ultrasound of abdomen ordered for  further evaluation. -Follow-up dependent upon results.  Bipolar disorder, in full remission,(HCC) -Managed by psychiatry  Diabetes: - managed by endocrine (Dr. Cruzita Lederer)  Return if symptoms worsen or fail to improve. Continue routine follow up for chronic medical conditions.   Note is dictated utilizing voice recognition software. Although note has been proof read prior to signing, occasional typographical errors still can be missed. If any questions arise, please do not hesitate to call for verification.  Electronically signed by: Howard Pouch, DO [place]

## 2017-12-19 NOTE — Progress Notes (Deleted)
Office Visit Note  Patient: Willie Hughes             Date of Birth: 02/09/1961           MRN: [id number]             PCP: Natalia Leatherwood, DO Referring: Doreene Nest, NP Visit Date: 12/26/2017 Occupation: @    Subjective:  No chief complaint on file.   History of Present Illness: Willie Hughes is a 57 y.o. male ***   Activities of Daily Living:  Patient reports morning stiffness for *** {minute/hour:19697}.   Patient {ACTIONS;DENIES/REPORTS:21021675::"Denies"} nocturnal pain.  Difficulty dressing/grooming: {ACTIONS;DENIES/REPORTS:21021675::"Denies"} Difficulty climbing stairs: {ACTIONS;DENIES/REPORTS:21021675::"Denies"} Difficulty getting out of chair: {ACTIONS;DENIES/REPORTS:21021675::"Denies"} Difficulty using hands for taps, buttons, cutlery, and/or writing: {ACTIONS;DENIES/REPORTS:21021675::"Denies"}   No Rheumatology ROS completed.   PMFS History:  Patient Active Problem List   Diagnosis Date Noted  . Uncontrolled type 2 diabetes mellitus with peripheral neuropathy (HCC) 11/20/2017  . History of tobacco abuse 02/01/2017  . Arthritis 12/20/2016  . Medicare annual wellness visit, subsequent 10/11/2016  . Chronic allergic rhinitis 09/20/2016  . Chronic headaches 09/20/2016  . Hypothyroidism 01/28/2016  . Hyperlipidemia 12/15/2015  . Erectile dysfunction 12/15/2015  . Bipolar disorder (HCC) 12/15/2015    Past Medical History:  Diagnosis Date  . Acne   . Allergic rhinitis   . Allergy   . Bipolar disorder (HCC)   . COPD (chronic obstructive pulmonary disease) (HCC)   . Erectile dysfunction   . Frequent headaches   . Heart disease   . Heart murmur   . Hyperlipidemia   . Hypothyroidism   . Type 2 diabetes mellitus (HCC)     Family History  Problem Relation Age of Onset  . Heart attack Father   . Stroke Father   . Mental illness Father   . Heart disease Father   . Drug abuse Father   . Depression Father   . Alcohol abuse Father   . Arthritis  Father   . Stroke Mother   . Diabetes Mother   . Depression Mother   . Arthritis Mother   . Mental illness Mother   . Depression Sister   . Lupus Sister   . Bipolar disorder Paternal Aunt   . Lupus Sister   . Lupus Brother    Past Surgical History:  Procedure Laterality Date  . WISDOM TOOTH EXTRACTION     Social History   Social History Narrative   Single and has one cat. Living in [place]. Never married. No kids.    Born and raised in [place] by parents. 2 sisters and 1 brother and pt is the youngest. Pt has 2 associates degrees in Sports administrator   Disabled for Bipolar disorder.  Last worked 2008    Enjoys watching TV, playing on the Internet.     Objective: Vital Signs: There were no vitals taken for this visit.   Physical Exam   Musculoskeletal Exam: ***  CDAI Exam: No CDAI exam completed.    Investigation: Findings:  11/01/17: ANA negative and Sed rate 9    CBC Latest Ref Rng & Units 11/01/2017 04/18/2017 02/09/2016  WBC 4.0 - 10.5 K/uL 8.0 6.8 8.8  Hemoglobin 13.0 - 17.0 g/dL 65.7 84.6 96.2  Hematocrit 39.0 - 52.0 % 46.1 44.3 44.0  Platelets 150.0 - 400.0 K/uL 331.0 338 324.0   CMP Latest Ref Rng & Units 11/22/2017 09/26/2017 04/18/2017  Glucose 65 - 99 mg/dL 952(W) 413(K) 440(N)  BUN 6 -  24 mg/dL Creatinine 0.76 - 1.27 mg/dL 1.61 0.96 0.45  Sodium 134 - 144 mmol/L 140 136 134  Potassium 3.5 - 5.2 mmol/L 4.5 3.6 4.2  Chloride 96 - 106 mmol/L 102 98 95(L)  CO2 20 - 29 mmol/L Calcium 8.7 - 10.2 mg/dL 9.9 10.8(H) 10.1  Total Protein 6.0 - 8.5 g/dL 6.8 7.5 6.8  Total Bilirubin 0.0 - 1.2 mg/dL 0.3 0.4 0.4  Alkaline Phos 39 - 117 IU/L 120(H) 79 87  AST 0 - 40 IU/L 42(H) 42(H) 36  ALT 0 - 44 IU/L 51(H) 43 40     Imaging: No results found.  Speciality Comments: No specialty comments available.    Procedures:  No procedures performed Allergies: Kava kava and Other   Assessment / Plan:     Visit Diagnoses: Family history of  systemic lupus erythematosus - 11/01/17: ANA -, Sed rate 9no other autoimmune labs found  Uncontrolled type 2 diabetes mellitus with peripheral neuropathy (HCC)  History of COPD  History of bipolar disorder    Orders: No orders of the defined types were placed in this encounter.  No orders of the defined types were placed in this encounter.   Face-to-face time spent with patient was *** minutes. 50% of time was spent in counseling and coordination of care.  Follow-Up Instructions: No follow-ups on file.   Gearldine Bienenstock, PA-C  Note - This record has been created using Dragon software.  Chart creation errors have been sought, but may not always  have been located. Such creation errors do not reflect on  the standard of medical care.

## 2017-12-19 NOTE — Patient Instructions (Addendum)
We will test your labs today.  I have also ordered a Ultrasound of abdomen and liver.     Please help [place] help you:  We are honored you have chosen [facility] for your Primary Care home. Below you will find basic instructions that you may need to access in the future. Please help [place] help you by reading the instructions, which cover many of the frequent questions we experience.   Prescription refills and request:  -In order to allow more efficient response time, please call your pharmacy for all refills. They will forward the request electronically to [place]. This allows for the quickest possible response. Request left on a nurse line can take longer to refill, since these are checked as time allows between office patients and other phone calls.  - refill request can take up to 3-5 working days to complete.  - If request is sent electronically and request is appropiate, it is usually completed in 1-2 business days.  - all patients will need to be seen routinely for all chronic medical conditions requiring prescription medications (see follow-up below). If you are overdue for follow up on your condition, you will be asked to make an appointment and we will call in enough medication to cover you until your appointment (up to 30 days).  - all controlled substances will require a face to face visit to request/refill.  - if you desire your prescriptions to go through a new pharmacy, and have an active script at original pharmacy, you will need to call your pharmacy and have scripts transferred to new pharmacy. This is completed between the pharmacy locations and not by your provider.    Results: If any images or labs were ordered, it can take up to 1 week to get results depending on the test ordered and the lab/facility running and resulting the test. - Normal or stable results, which do not need further discussion, may be released to your mychart immediately with attached note to you. A call may not be  generated for normal results. Please make certain to sign up for mychart. If you have questions on how to activate your mychart you can call the front office.  - If your results need further discussion, our office will attempt to contact you via phone, and if unable to reach you after 2 attempts, we will release your abnormal result to your mychart with instructions.  - All results will be automatically released in mychart after 1 week.  - Your provider will provide you with explanation and instruction on all relevant material in your results. Please keep in mind, results and labs may appear confusing or abnormal to the untrained eye, but it does not mean they are actually abnormal for you personally. If you have any questions about your results that are not covered, or you desire more detailed explanation than what was provided, you should make an appointment with your provider to do so.   Our office handles many outgoing and incoming calls daily. If we have not contacted you within 1 week about your results, please check your mychart to see if there is a message first and if not, then contact our office.  In helping with this matter, you help decrease call volume, and therefore allow [place] to be able to respond to patients needs more efficiently.   Acute office visits (sick visit):  An acute visit is intended for a new problem and are scheduled in shorter time slots to allow schedule openings  for patients with new problems. This is the appropriate visit to discuss a new problem. Problems will not be addressed by phone call or Echart message. Appointment is needed if requesting treatment. In order to provide you with excellent quality medical care with proper time for you to explain your problem, have an exam and receive treatment with instructions, these appointments should be limited to one new problem per visit. If you experience a new problem, in which you desire to be addressed, please make an acute  office visit, we save openings on the schedule to accommodate you. Please do not save your new problem for any other type of visit, let us take care of it properly and quickly for you.   Follow up visits:  Depending on your condition(s) your provider will need to see you routinely in order to provide you with quality care and prescribe medication(s). Most chronic conditions (Example: hypertension, Diabetes, depression/anxiety... etc), require visits a couple times a year. Your provider will instruct you on proper follow up for your personal medical conditions and history. Please make certain to make follow up appointments for your condition as instructed. Failing to do so could result in lapse in your medication treatment/refills. If you request a refill, and are overdue to be seen on a condition, we will always provide you with a 30 Friedl script (once) to allow you time to schedule.    Medicare wellness (well visit): - we have a wonderful Nurse Maudie Mercury), that will meet with you and provide you will yearly medicare wellness visits. These visits should occur yearly (can not be scheduled less than 1 calendar year apart) and cover preventive health, immunizations, advance directives and screenings you are entitled to yearly through your medicare benefits. Do not miss out on your entitled benefits, this is when medicare will pay for these benefits to be ordered for you.  These are strongly encouraged by your provider and is the appropriate type of visit to make certain you are up to date with all preventive health benefits. If you have not had your medicare wellness exam in the last 12 months, please make certain to schedule one by calling the office and schedule your medicare wellness with Maudie Mercury as soon as possible.   Yearly physical (well visit):  - Adults are recommended to be seen yearly for physicals. Check with your insurance and date of your last physical, most insurances require one calendar year between  physicals. Physicals include all preventive health topics, screenings, medical exam and labs that are appropriate for gender/age and history. You may have fasting labs needed at this visit. This is a well visit (not a sick visit), new problems should not be covered during this visit (see acute visit).  - Pediatric patients are seen more frequently when they are younger. Your provider will advise you on well child visit timing that is appropriate for your their age. - This is not a medicare wellness visit. Medicare wellness exams do not have an exam portion to the visit. Some medicare companies allow for a physical, some do not allow a yearly physical. If your medicare allows a yearly physical you can schedule the medicare wellness with our nurse Maudie Mercury and have your physical with your provider after, on the same Melfi. Please check with insurance for your full benefits.   Late Policy/No Shows:  - all new patients should arrive 15-30 minutes earlier than appointment to allow [place] time  to  obtain all personal demographics,  insurance information and  for you to complete office paperwork. - All established patients should arrive 10-15 minutes earlier than appointment time to update all information and be checked in .  - In our best efforts to run on time, if you are late for your appointment you will be asked to either reschedule or if able, we will work you back into the schedule. There will be a wait time to work you back in the schedule,  depending on availability.  - If you are unable to make it to your appointment as scheduled, please call 24 hours ahead of time to allow [place] to fill the time slot with someone else who needs to be seen. If you do not cancel your appointment ahead of time, you may be charged a no show fee.

## 2017-12-20 LAB — HEPATITIS PANEL, ACUTE
HEP A IGM: NONREACTIVE
HEP B S AG: NONREACTIVE
HEP C AB: NONREACTIVE
Hep B C IgM: NONREACTIVE
SIGNAL TO CUT-OFF: 0.02 (ref <1.00)

## 2017-12-20 LAB — COMPREHENSIVE METABOLIC PANEL
ALBUMIN: 4.7 g/dL (ref 3.5–5.2)
ALT: 33 U/L (ref 0–53)
AST: 33 U/L (ref 0–37)
Alkaline Phosphatase: 80 U/L (ref 39–117)
BUN: 9 mg/dL (ref 6–23)
CALCIUM: 10.2 mg/dL (ref 8.4–10.5)
CHLORIDE: 100 meq/L (ref 96–112)
CO2: 26 meq/L (ref 19–32)
CREATININE: 0.86 mg/dL (ref 0.40–1.50)
GFR: 97.58 mL/min (ref >60.00)
Glucose, Bld: 277 mg/dL — ABNORMAL HIGH (ref 70–99)
Potassium: 4.2 mEq/L (ref 3.5–5.1)
Sodium: 136 mEq/L (ref 135–145)
Total Bilirubin: 0.4 mg/dL (ref 0.2–1.2)
Total Protein: 7.5 g/dL (ref 6.0–8.3)

## 2017-12-20 LAB — HIV ANTIBODY (ROUTINE TESTING W REFLEX): HIV: NONREACTIVE

## 2017-12-24 ENCOUNTER — Ambulatory Visit (INDEPENDENT_AMBULATORY_CARE_PROVIDER_SITE_OTHER): Payer: PPO

## 2017-12-24 ENCOUNTER — Encounter: Payer: Self-pay | Admitting: Family Medicine

## 2017-12-24 ENCOUNTER — Telehealth: Payer: Self-pay | Admitting: Family Medicine

## 2017-12-24 DIAGNOSIS — R1011 Right upper quadrant pain: Secondary | ICD-10-CM

## 2017-12-24 DIAGNOSIS — R748 Abnormal levels of other serum enzymes: Secondary | ICD-10-CM

## 2017-12-24 DIAGNOSIS — I700 Atherosclerosis of aorta: Secondary | ICD-10-CM

## 2017-12-24 DIAGNOSIS — L709 Acne, unspecified: Secondary | ICD-10-CM

## 2017-12-24 DIAGNOSIS — E032 Hypothyroidism due to medicaments and other exogenous substances: Secondary | ICD-10-CM

## 2017-12-24 DIAGNOSIS — E119 Type 2 diabetes mellitus without complications: Secondary | ICD-10-CM | POA: Diagnosis not present

## 2017-12-24 MED ORDER — TRETINOIN 0.05 % EX CREA: TOPICAL_CREAM | CUTANEOUS | 5 refills | Status: DC

## 2017-12-24 MED ORDER — LEVOTHYROXINE SODIUM 75 MCG PO TABS: ORAL_TABLET | ORAL | 3 refills | Status: DC

## 2017-12-24 NOTE — Telephone Encounter (Signed)
Spoke with patient reviewed lab results,US results and instructions. Patient verbalized understanding. 

## 2017-12-24 NOTE — Telephone Encounter (Signed)
Retin-A and synthroid refilled.

## 2017-12-24 NOTE — Telephone Encounter (Signed)
Please inform patient the following information: His US of abd showed evidence of "fatty liver disease". This is a common finding, and can be improved with tighter control on triglycerides/cholesterol if elevated, exercise. Fish oil supplement can help lower triglycerides.  His liver enzymes returned to normal. fatty liver disease can make the liver more sensitive to meds like tylenol or alcohol use and may have been the cause for the prior elevated readings.

## 2017-12-25 MED ORDER — GLIMEPIRIDE 4 MG PO TABS: 4.0000 mg | ORAL_TABLET | Freq: Every day | ORAL | 3 refills | Status: DC

## 2017-12-26 ENCOUNTER — Ambulatory Visit: Payer: Self-pay | Admitting: Rheumatology

## 2018-01-01 ENCOUNTER — Encounter: Payer: Self-pay | Admitting: Family Medicine

## 2018-01-03 ENCOUNTER — Encounter: Payer: Self-pay | Admitting: Internal Medicine

## 2018-01-04 ENCOUNTER — Other Ambulatory Visit: Payer: Self-pay

## 2018-01-04 DIAGNOSIS — Z1283 Encounter for screening for malignant neoplasm of skin: Secondary | ICD-10-CM

## 2018-01-09 ENCOUNTER — Encounter: Payer: Self-pay | Admitting: *Deleted

## 2018-01-09 ENCOUNTER — Emergency Department (INDEPENDENT_AMBULATORY_CARE_PROVIDER_SITE_OTHER)
Admission: EM | Admit: 2018-01-09 | Discharge: 2018-01-09 | Disposition: A | Discharge status: Home / Self Care | Payer: PPO | Source: Home / Self Care | Attending: Family Medicine | Admitting: Family Medicine

## 2018-01-09 ENCOUNTER — Other Ambulatory Visit: Payer: Self-pay

## 2018-01-09 DIAGNOSIS — R21 Rash and other nonspecific skin eruption: Secondary | ICD-10-CM | POA: Diagnosis not present
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
## 2018-01-09 NOTE — Discharge Instructions (Signed)
°  Please schedule an appointment with a dermatologist for a further evaluation of your rash.  They may take a skin sample by scraping the bumps for testing.  This will help determine what, if any, additional testing or treatment is needed.

## 2018-01-10 ENCOUNTER — Telehealth: Payer: Self-pay | Admitting: Family Medicine

## 2018-01-10 ENCOUNTER — Encounter: Payer: Self-pay | Admitting: *Deleted

## 2018-01-10 ENCOUNTER — Encounter: Payer: Self-pay | Admitting: Family Medicine

## 2018-01-10 DIAGNOSIS — Z1283 Encounter for screening for malignant neoplasm of skin: Secondary | ICD-10-CM

## 2018-01-10 NOTE — Telephone Encounter (Signed)
Please inform patient the following information: I have reviewed the ED visit. The ED doctor felt this may have been a condition called molluscum contagiosum and recommended follow up with dermatology.  We will place another referral to dermatology specialist and make sure to include his request for male doctor - he should understand this can only be honored if there is a male doctor that is excepting patients at that office. We will do our best to honor his request.    Rosalita ChessmanSuzanne: please check in to the referral placed 01/04/2018. I do not know if that person is at dermatology specialists if so we can keep that referral but request a male provider for him. If it is not dermatology specialist then we need place another referral there dx: penile rash.,  with male doc (if possible).

## 2018-01-10 NOTE — Telephone Encounter (Signed)
Sent information in MY Chart to patient. placed new referral as directed.

## 2018-01-15 NOTE — Progress Notes (Signed)
Office Visit Note  Patient: Willie Hughes             Date of Birth: 04-10-1961           MRN: [id number]             PCP: Natalia Leatherwood, DO Referring: Doreene Nest, NP Visit Date: 01/29/2018 Occupation: @GUAROCC @    Subjective:  Pain in joints and muscles.   History of Present Illness: Willie Hughes is a 57 y.o. male seen in consultation per request of his PCP.  According to patient he has had symptoms since he was in high school with muscle pain.  He has increased muscle pain for few days as flares and it last for few days and then resolved.  Is been increasing the last 4 to 5 years.  He also suffers from poor concentration and memory problems.  He has not seen a neurologist.  He experiences increased fatigue.  He states he has allergies but is a skin test was negative because he took antihistamines.  He complains of joint pain in his bilateral knees and bilateral hips.  There is positive family history of lupus and his brother and 2 sisters.  Activities of Daily Living:  Patient reports morning stiffness for 0 minute.   Patient Denies nocturnal pain.  Difficulty dressing/grooming: Denies Difficulty climbing stairs: Reports Difficulty getting out of chair: Reports Difficulty using hands for taps, buttons, cutlery, and/or writing: Denies   Review of Systems  Constitutional: Positive for fatigue. Negative for night sweats.  HENT: Positive for mouth dryness. Negative for mouth sores and nose dryness.   Eyes: Negative for redness and dryness.  Respiratory: Negative for shortness of breath and difficulty breathing.   Cardiovascular: Negative for chest pain, palpitations, hypertension, irregular heartbeat and swelling in legs/feet.  Gastrointestinal: Negative for constipation and diarrhea.  Endocrine: Negative for increased urination.  Musculoskeletal: Positive for arthralgias and joint pain. Negative for joint swelling, myalgias, muscle weakness, morning stiffness, muscle  tenderness and myalgias.  Skin: Positive for color change. Negative for rash, hair loss, nodules/bumps, skin tightness, ulcers and sensitivity to sunlight.  Allergic/Immunologic: Negative for susceptible to infections.  Neurological: Negative for dizziness, fainting, memory loss, night sweats and weakness ( ).  Hematological: Negative for swollen glands.  Psychiatric/Behavioral: Positive for depressed mood and sleep disturbance. The patient is nervous/anxious.     PMFS History:  Patient Active Problem List   Diagnosis Date Noted  . Uncontrolled type 2 diabetes mellitus with peripheral neuropathy (HCC) 11/20/2017  . History of tobacco abuse 02/01/2017  . Arthritis 12/20/2016  . Medicare annual wellness visit, subsequent 10/11/2016  . Chronic allergic rhinitis 09/20/2016  . Chronic headaches 09/20/2016  . Hypothyroidism 01/28/2016  . Hyperlipidemia 12/15/2015  . Erectile dysfunction 12/15/2015  . Bipolar disorder (HCC) 12/15/2015    Past Medical History:  Diagnosis Date  . Acne   . Allergic rhinitis   . Allergy   . Bipolar disorder (HCC)   . COPD (chronic obstructive pulmonary disease) (HCC)   . Erectile dysfunction   . Frequent headaches   . Heart disease   . Heart murmur   . Hyperlipidemia   . Hypothyroidism   . Type 2 diabetes mellitus (HCC)     Family History  Problem Relation Age of Onset  . Heart attack Father   . Stroke Father   . Mental illness Father   . Heart disease Father   . Drug abuse Father   . Depression Father   .  Alcohol abuse Father   . Arthritis Father   . Stroke Mother   . Diabetes Mother   . Depression Mother   . Arthritis Mother   . Mental illness Mother   . Depression Sister   . Lupus Sister   . Bipolar disorder Paternal Aunt   . Lupus Sister   . Lupus Brother    Past Surgical History:  Procedure Laterality Date  . WISDOM TOOTH EXTRACTION     Social History   Social History Narrative   Single and has one cat. Living in [place]. Never  married. No kids.    Born and raised in [place] by parents. 2 sisters and 1 brother and pt is the youngest. Pt has 2 associates degrees in Sports administrator   Disabled for Bipolar disorder.  Last worked 2008    Enjoys watching TV, playing on the Internet.     Objective: Vital Signs: BP (!) 145/89 (BP Location: Right Arm, Patient Position: Sitting, Cuff Size: Normal)   Pulse 81   Resp 16   Ht 6' 3.5" (1.918 m)   Wt 226 lb (102.5 kg)   BMI 27.88 kg/m    Physical Exam  Constitutional: He is oriented to person, place, and time. He appears well-developed and well-nourished.  HENT:  Head: Normocephalic and atraumatic.  Eyes: Pupils are equal, round, and reactive to light. Conjunctivae and EOM are normal.  Neck: Normal range of motion. Neck supple.  Cardiovascular: Normal rate, regular rhythm and normal heart sounds.  Pulmonary/Chest: Effort normal and breath sounds normal.  Abdominal: Soft. Bowel sounds are normal.  Neurological: He is alert and oriented to person, place, and time.  Skin: Skin is warm and dry. Capillary refill takes less than 2 seconds.  Psychiatric: He has a normal mood and affect. His behavior is normal.  Nursing note and vitals reviewed.    Musculoskeletal Exam: C-spine thoracic lumbar spine good range of motion.  There was no SI joint tenderness.  He had limited range of motion of his left shoulder due to prior injury.  Right shoulder joint, bilateral elbow joints, bilateral wrist, bilateral MCPs PIPs DIPs were in good range of motion with no synovitis.  Hip joints knee joints ankles MTPs PIPs were in good range of motion with no synovitis.  No Achilles tendinitis or plantar fasciitis was noted needed.  He had mild PIP and DIP thickening in his feet.  CDAI Exam: No CDAI exam completed.    Investigation: No additional findings. Component     Latest Ref Rng & Units 09/26/2017 09/28/2017 11/01/2017  Cholesterol     0 - 200 mg/dL 161    Triglycerides      0.0 - 149.0 mg/dL 096.0 (H)    HDL Cholesterol     >39.00 mg/dL 45.40 (L)    VLDL     0.0 - 40.0 mg/dL 98.1 (H)    Total CHOL/HDL Ratio      4    NonHDL      107.76    Creatinine, Urine     20 - 320 mg/dL  81   Microalb, Ur     mg/dL  0.9   MICROALB/CREAT RATIO     <30 mcg/mg creat  11   Hemoglobin A1C     4.6 - 6.5 % 9.7 (H)    T4,Free(Direct)     0.60 - 1.60 ng/dL 1.91    TSH     4.78 - 4.50 uIU/mL 5.49 (H)  3.01  Direct  LDL     mg/dL 25.3    Anti Nuclear Antibody(ANA)     NEGATIVE   NEGATIVE  Sed Rate     0 - 20 mm/hr   9   Component     Latest Ref Rng & Units 12/19/2017  Hep A Ab, IgM     NON-REACTI NON-REACTIVE  Hepatitis B Surface Ag     NON-REACTI NON-REACTIVE  Hep B Core Ab, IgM     NON-REACTI NON-REACTIVE  Hepatitis C Ab     NON-REACTI NON-REACTIVE  SIGNAL TO CUT-OFF     <1.00 0.02  HIV     NON-REACTI NON-REACTIVE   CBC Latest Ref Rng & Units 11/01/2017 04/18/2017 02/09/2016  WBC 4.0 - 10.5 K/uL 8.0 6.8 8.8  Hemoglobin 13.0 - 17.0 g/dL 66.4 40.3 47.4  Hematocrit 39.0 - 52.0 % 46.1 44.3 44.0  Platelets 150.0 - 400.0 K/uL 331.0 338 324.0   CMP Latest Ref Rng & Units 12/19/2017 11/22/2017 09/26/2017  Glucose 70 - 99 mg/dL 259(D) 638(V) 564(P)  BUN 6 - 23 mg/dL 9 9 8   Creatinine 0.40 - 1.50 mg/dL 3.29 5.18 8.41  Sodium 135 - 145 mEq/L 136 140 136  Potassium 3.5 - 5.1 mEq/L 4.2 4.5 3.6  Chloride 96 - 112 mEq/L 100 102 98  CO2 19 - 32 mEq/L 26 20 30   Calcium 8.4 - 10.5 mg/dL 66.0 9.9 10.8(H)  Total Protein 6.0 - 8.3 g/dL 7.5 6.8 7.5  Total Bilirubin 0.2 - 1.2 mg/dL 0.4 0.3 0.4  Alkaline Phos 39 - 117 U/L 80 120(H) 79  AST 0 - 37 U/L 33 42(H) 42(H)  ALT 0 - 53 U/L 33 51(H) 43     Imaging: Xr Hip Unilat W Or W/o Pelvis 2-3 Views Left  Result Date: 01/29/2018 No hip joint narrowing was noted.  No chondrocalcinosis was noted.  SI joint was unremarkable. Unremarkable x-ray of the hip joint.  Xr Hip Unilat W Or W/o Pelvis 2-3 Views Right  Result Date:  01/29/2018 No hip joint narrowing was noted.  No chondrocalcinosis was noted.  SI joint was unremarkable. Unremarkable x-ray of the hip joint.  Xr Knee 3 View Left  Result Date: 01/29/2018 Severe medial compartment narrowing with intercondylar osteophytes and lateral osteophytes was noted.  A fabella was noted.  No chondrocalcinosis was noted.  Moderate patellofemoral narrowing was noted. Impression: These findings are consistent with severe osteoarthritis and moderate patellofemoral narrowing.  Xr Knee 3 View Right  Result Date: 01/29/2018 Moderate to severe medial compartment narrowing with intercondylar osteophytes and lateral osteophytes was noted.  No chondrocalcinosis was noted.  Moderate patellofemoral narrowing was noted. Impression: These findings are consistent with moderate to severe osteoarthritis and moderate patellofemoral narrowing.   Speciality Comments: No specialty comments available.    Procedures:  No procedures performed Allergies: Kava kava and Other   Assessment / Plan:     Visit Diagnoses: Family history of systemic lupus erythematosus - strong fmhx, pt is concerned about sx related to possible lupus.  His ANA was negative.  And sed rate was normal.  He had no clinical features of lupus on examination.  He would be convinced if he can do more detailed work-up.  I will send him forAVISE labs.  He gives history of mild Raynaud's and intermittent rash.  Chronic pain of both knees -he complains of intermittent increased pain in his bilateral knee joints.  No warmth swelling or effusion was noted.  Plan: XR KNEE 3 VIEW RIGHT, XR KNEE  3 VIEW LEFT.  The x-ray showed moderate osteoarthritis in the right knee and severe osteoarthritis in the left knee joint.  Handout on knee joint muscle strengthening exercises was given.  Chronic pain of both hips -he also complains of intermittent pain in his bilateral hip joints.  He had good range of motion of his bilateral hip joints.  Plan:  XR HIP UNILAT W OR W/O PELVIS 2-3 VIEWS RIGHT, XR HIP UNILAT W OR W/O PELVIS 2-3 VIEWS LEFT.  The x-ray of bilateral hip joints were unremarkable.  Other fatigue -he complains of ongoing fatigue.  Plan: CK, Serum protein electrophoresis with reflex  History of bipolar disorder-he is on treatment.  History of hyperlipidemia  History of tobacco abuse  History of hypothyroidism  History of type 2 diabetes mellitus  History of COPD    Orders: Orders Placed This Encounter  Procedures  . XR KNEE 3 VIEW RIGHT  . XR KNEE 3 VIEW LEFT  . XR HIP UNILAT W OR W/O PELVIS 2-3 VIEWS RIGHT  . XR HIP UNILAT W OR W/O PELVIS 2-3 VIEWS LEFT  . CK  . Serum protein electrophoresis with reflex   No orders of the defined types were placed in this encounter.   Face-to-face time spent with patient was 50 minutes. Greater than 50% of time was spent in counseling and coordination of care.  Follow-Up Instructions: Return for Pain in multiple joints and muscles.   Pollyann SavoyShaili Shanikia Kernodle, MD  Note - This record has been created using Animal nutritionistDragon software.  Chart creation errors have been sought, but may not always  have been located. Such creation errors do not reflect on  the standard of medical care.

## 2018-01-16 ENCOUNTER — Encounter: Payer: Self-pay | Admitting: Internal Medicine

## 2018-01-16 ENCOUNTER — Other Ambulatory Visit: Payer: Self-pay | Admitting: Primary Care

## 2018-01-16 ENCOUNTER — Other Ambulatory Visit: Payer: Self-pay

## 2018-01-16 DIAGNOSIS — E119 Type 2 diabetes mellitus without complications: Secondary | ICD-10-CM

## 2018-01-16 MED ORDER — METFORMIN HCL 1000 MG PO TABS: 1000.0000 mg | ORAL_TABLET | Freq: Two times a day (BID) | ORAL | 0 refills | Status: DC

## 2018-01-29 ENCOUNTER — Ambulatory Visit (INDEPENDENT_AMBULATORY_CARE_PROVIDER_SITE_OTHER): Payer: PPO | Admitting: Rheumatology

## 2018-01-29 ENCOUNTER — Ambulatory Visit (INDEPENDENT_AMBULATORY_CARE_PROVIDER_SITE_OTHER): Payer: PPO

## 2018-01-29 ENCOUNTER — Ambulatory Visit (INDEPENDENT_AMBULATORY_CARE_PROVIDER_SITE_OTHER): Payer: Self-pay

## 2018-01-29 ENCOUNTER — Encounter: Payer: Self-pay | Admitting: Rheumatology

## 2018-01-29 VITALS — BP 145/89 | HR 81 | Resp 16 | Ht 75.5 in | Wt 226.0 lb

## 2018-01-29 DIAGNOSIS — Z8659 Personal history of other mental and behavioral disorders: Secondary | ICD-10-CM

## 2018-01-29 DIAGNOSIS — Z8639 Personal history of other endocrine, nutritional and metabolic disease: Secondary | ICD-10-CM

## 2018-01-29 DIAGNOSIS — M25562 Pain in left knee: Secondary | ICD-10-CM | POA: Diagnosis not present

## 2018-01-29 DIAGNOSIS — Z87891 Personal history of nicotine dependence: Secondary | ICD-10-CM

## 2018-01-29 DIAGNOSIS — Z8709 Personal history of other diseases of the respiratory system: Secondary | ICD-10-CM | POA: Diagnosis not present

## 2018-01-29 DIAGNOSIS — Z8269 Family history of other diseases of the musculoskeletal system and connective tissue: Secondary | ICD-10-CM | POA: Diagnosis not present

## 2018-01-29 DIAGNOSIS — M25551 Pain in right hip: Secondary | ICD-10-CM

## 2018-01-29 DIAGNOSIS — R5383 Other fatigue: Secondary | ICD-10-CM | POA: Diagnosis not present

## 2018-01-29 DIAGNOSIS — M25552 Pain in left hip: Secondary | ICD-10-CM

## 2018-01-29 DIAGNOSIS — M25561 Pain in right knee: Secondary | ICD-10-CM | POA: Diagnosis not present

## 2018-01-29 DIAGNOSIS — G8929 Other chronic pain: Secondary | ICD-10-CM | POA: Diagnosis not present

## 2018-01-29 NOTE — Patient Instructions (Signed)

## 2018-02-01 LAB — PROTEIN ELECTROPHORESIS, SERUM, WITH REFLEX
ALPHA 1: 0.2 g/dL (ref 0.2–0.3)
ALPHA 2: 0.6 g/dL (ref 0.5–0.9)
Albumin ELP: 4.3 g/dL (ref 3.8–4.8)
Beta 2: 0.4 g/dL (ref 0.2–0.5)
Beta Globulin: 0.6 g/dL (ref 0.4–0.6)
Gamma Globulin: 0.8 g/dL (ref 0.8–1.7)
TOTAL PROTEIN: 7 g/dL (ref 6.1–8.1)

## 2018-02-01 LAB — IFE INTERPRETATION

## 2018-02-01 NOTE — Progress Notes (Signed)
Please refer to rheumatology for abnormal IFE

## 2018-02-04 ENCOUNTER — Encounter: Payer: Self-pay | Admitting: Internal Medicine

## 2018-02-04 ENCOUNTER — Telehealth: Payer: Self-pay | Admitting: *Deleted

## 2018-02-04 ENCOUNTER — Encounter: Payer: Self-pay | Admitting: Rheumatology

## 2018-02-04 DIAGNOSIS — R899 Unspecified abnormal finding in specimens from other organs, systems and tissues: Secondary | ICD-10-CM

## 2018-02-04 NOTE — Telephone Encounter (Signed)
-----   Message from Pollyann SavoyShaili Deveshwar, MD sent at 02/01/2018 12:18 PM EDT ----- Please refer to rheumatology for abnormal IFE

## 2018-02-05 ENCOUNTER — Encounter: Payer: Self-pay | Admitting: Rheumatology

## 2018-02-05 NOTE — Telephone Encounter (Signed)
I left a message for patient on the answering machine to call back.  He had abnormal IFE.  I do not have any further explanation of it but I would like for him to have hematology evaluation.

## 2018-02-06 ENCOUNTER — Encounter: Payer: Self-pay | Admitting: Family Medicine

## 2018-02-07 ENCOUNTER — Ambulatory Visit: Payer: Self-pay | Admitting: Rheumatology

## 2018-02-07 ENCOUNTER — Encounter: Payer: Self-pay | Admitting: Hematology

## 2018-02-14 ENCOUNTER — Other Ambulatory Visit: Payer: Self-pay | Admitting: Primary Care

## 2018-02-14 DIAGNOSIS — E785 Hyperlipidemia, unspecified: Secondary | ICD-10-CM

## 2018-02-19 ENCOUNTER — Encounter: Payer: Self-pay | Admitting: Hematology

## 2018-02-21 ENCOUNTER — Encounter: Payer: Self-pay | Admitting: Hematology

## 2018-02-21 DIAGNOSIS — R911 Solitary pulmonary nodule: Secondary | ICD-10-CM

## 2018-02-21 HISTORY — DX: Solitary pulmonary nodule: R91.1

## 2018-02-22 ENCOUNTER — Encounter: Payer: Self-pay | Admitting: Hematology

## 2018-02-22 NOTE — Progress Notes (Signed)
Office Visit Note  Patient: Willie Hughes             Date of Birth: 1961/05/04           MRN: [id number]             PCP: Natalia Leatherwood, DO Referring: Doreene Nest, NP Visit Date: 03/07/2018 Occupation: @GUAROCC @ ################################################################ Subjective:  Pain in knees and rash   History of Present Illness: Willie Hughes is a 57 y.o. male with history of osteoarthritis in his knee joints.  He states he continues to have some rash on his face.  He states that he develops Raynaud's during the wintertime.  He also saw the him at neurologist for abnormal SPEP and is getting further work-up.  He states his apartment is infested with roaches and mold which is affecting his health.  Activities of Daily Living:  Patient reports morning stiffness for 0 none.   Patient Denies nocturnal pain.  Difficulty dressing/grooming: Denies Difficulty climbing stairs: Reports Difficulty getting out of chair: Reports Difficulty using hands for taps, buttons, cutlery, and/or writing: Denies  Review of Systems  Constitutional: Positive for fatigue. Negative for night sweats.  HENT: Negative for mouth sores, mouth dryness and nose dryness.   Eyes: Positive for dryness. Negative for redness.  Respiratory: Negative for shortness of breath and difficulty breathing.   Cardiovascular: Negative for chest pain, palpitations, hypertension, irregular heartbeat and swelling in legs/feet.  Gastrointestinal: Positive for constipation. Negative for diarrhea.  Endocrine: Negative for increased urination.  Genitourinary: Negative for decreased urine output.  Musculoskeletal: Positive for arthralgias, joint pain, joint swelling, muscle weakness and muscle tenderness. Negative for gait problem, myalgias, morning stiffness and myalgias.  Skin: Positive for rash. Negative for color change, hair loss, nodules/bumps, skin tightness, ulcers and sensitivity to sunlight.    Allergic/Immunologic: Negative for susceptible to infections.  Neurological: Positive for weakness. Negative for dizziness, fainting, memory loss and night sweats.  Hematological: Negative for bruising/bleeding tendency and swollen glands.  Psychiatric/Behavioral: Positive for sleep disturbance. Negative for depressed mood. The patient is not nervous/anxious.     PMFS History:  Patient Active Problem List   Diagnosis Date Noted  . Abnormal SPEP 02/26/2018  . Uncontrolled type 2 diabetes mellitus with peripheral neuropathy (HCC) 11/20/2017  . History of tobacco abuse 02/01/2017  . Arthritis 12/20/2016  . Medicare annual wellness visit, subsequent 10/11/2016  . Chronic allergic rhinitis 09/20/2016  . Chronic headaches 09/20/2016  . Hypothyroidism 01/28/2016  . Hyperlipidemia 12/15/2015  . Erectile dysfunction 12/15/2015  . Bipolar disorder (HCC) 12/15/2015    Past Medical History:  Diagnosis Date  . Acne   . Allergic rhinitis   . Allergy   . Bipolar disorder (HCC)   . COPD (chronic obstructive pulmonary disease) (HCC)   . Erectile dysfunction   . Frequent headaches   . Heart disease   . Heart murmur   . Hyperlipidemia   . Hypothyroidism   . Type 2 diabetes mellitus (HCC)     Family History  Problem Relation Age of Onset  . Heart attack Father   . Stroke Father   . Mental illness Father   . Heart disease Father   . Drug abuse Father   . Depression Father   . Alcohol abuse Father   . Arthritis Father   . Stroke Mother   . Diabetes Mother   . Depression Mother   . Arthritis Mother   . Mental illness Mother   . Depression Sister   .  Lupus Sister   . Bipolar disorder Paternal Aunt   . Lupus Sister   . Lupus Brother    Past Surgical History:  Procedure Laterality Date  . WISDOM TOOTH EXTRACTION     Social History   Social History Narrative   Single and has one cat. Living in [place]. Never married. No kids.    Born and raised in [place] by parents. 2 sisters  and 1 brother and pt is the youngest. Pt has 2 associates degrees in Sports administratorBiotech and Mechanical design   Disabled for Bipolar disorder.  Last worked 2008    Enjoys watching TV, playing on the Internet.    Objective: Vital Signs: BP 115/67 (BP Location: Left Arm, Patient Position: Sitting, Cuff Size: Normal)   Pulse 96   Resp 16   Ht 6' 3.5" (1.918 m)   Wt 223 lb 6.4 oz (101.3 kg)   BMI 27.55 kg/m    Physical Exam  Constitutional: He is oriented to person, place, and time. He appears well-developed and well-nourished.  HENT:  Head: Normocephalic and atraumatic.  Eyes: Pupils are equal, round, and reactive to light. Conjunctivae and EOM are normal.  Neck: Normal range of motion. Neck supple.  Cardiovascular: Normal rate, regular rhythm and normal heart sounds.  Pulmonary/Chest: Effort normal and breath sounds normal.  Abdominal: Soft. Bowel sounds are normal.  Neurological: He is alert and oriented to person, place, and time.  Skin: Skin is warm and dry. Capillary refill takes less than 2 seconds.  Psychiatric: He has a normal mood and affect. His behavior is normal.  Nursing note and vitals reviewed.    Musculoskeletal Exam: C-spine thoracic lumbar spine good range of motion.  Left shoulder joint had limited range of motion.  Elbow joints wrist joint MCPs PIPs DIPs were in good range of motion with no synovitis.  Hip joints knee joints ankles MTPs PIPs were in good range of motion with no synovitis.  CDAI Exam: No CDAI exam completed.   Investigation: No additional findings.  Imaging: No results found.  Recent Labs: Lab Results  Component Value Date   WBC 8.0 11/01/2017   HGB 15.4 11/01/2017   PLT 331.0 11/01/2017   NA 136 12/19/2017   K 4.2 12/19/2017   CL 100 12/19/2017   CO2 26 12/19/2017   GLUCOSE 277 (H) 12/19/2017   BUN 9 12/19/2017   CREATININE 0.86 12/19/2017   BILITOT 0.4 12/19/2017   ALKPHOS 80 12/19/2017   AST 33 12/19/2017   ALT 33 12/19/2017   PROT 7.0  01/29/2018   ALBUMIN 4.7 12/19/2017   CALCIUM 10.2 12/19/2017   GFRAA 116 11/22/2017  January 29, 2018 IFE faint monoclonal free lambda light chain is detected 5/19 Hep panel negative, HIV neg  Speciality Comments: No specialty comments available.  Procedures:  No procedures performed Allergies: Kava kava and Other   Assessment / Plan:     Visit Diagnoses: Primary osteoarthritis of both knees - Bilateral severe medial compartment narrowing with bilateral moderate chondromalacia patella.  Patient states that he is not interested in total knee replacement until his arthritis gets severe.  Rash-he complains of recurrent rash on his face.  He believes his acne.  Raynaud's disease without gangrene - AVISE was ordered again today.  He did not go for AVISE lab last visit.Marland Kitchen.  ANA was negative.  I will contact him once the labs are available.  Abnormal SPEP -patient has been seen by hematology currently.  History of bipolar disorder-he is on treatment.  History of hyperlipidemia  History of tobacco abuse  History of hypothyroidism  History of type 2 diabetes mellitus  History of COPD    Orders: No orders of the defined types were placed in this encounter.  No orders of the defined types were placed in this encounter.   Face-to-face time spent with patient was 30 minutes. Greater than 50% of time was spent in counseling and coordination of care.  Follow-Up Instructions: Return if symptoms worsen or fail to improve, for Osteoarthritis, raynauds.   Pollyann Savoy, MD  Note - This record has been created using Animal nutritionist.  Chart creation errors have been sought, but may not always  have been located. Such creation errors do not reflect on  the standard of medical care.
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
## 2018-02-26 ENCOUNTER — Telehealth: Payer: Self-pay | Admitting: Hematology

## 2018-02-26 ENCOUNTER — Encounter: Payer: Self-pay | Admitting: Hematology

## 2018-02-26 ENCOUNTER — Inpatient Hospital Stay: Payer: PPO | Attending: Hematology | Admitting: Hematology

## 2018-02-26 ENCOUNTER — Inpatient Hospital Stay: Payer: PPO

## 2018-02-26 VITALS — BP 130/81 | HR 91 | Temp 98.9°F | Resp 18 | Ht 75.5 in | Wt 221.7 lb

## 2018-02-26 DIAGNOSIS — M25562 Pain in left knee: Secondary | ICD-10-CM | POA: Diagnosis not present

## 2018-02-26 DIAGNOSIS — M79605 Pain in left leg: Secondary | ICD-10-CM | POA: Diagnosis not present

## 2018-02-26 DIAGNOSIS — M79604 Pain in right leg: Secondary | ICD-10-CM | POA: Insufficient documentation

## 2018-02-26 DIAGNOSIS — M170 Bilateral primary osteoarthritis of knee: Secondary | ICD-10-CM | POA: Insufficient documentation

## 2018-02-26 DIAGNOSIS — R778 Other specified abnormalities of plasma proteins: Secondary | ICD-10-CM | POA: Insufficient documentation

## 2018-02-26 DIAGNOSIS — E119 Type 2 diabetes mellitus without complications: Secondary | ICD-10-CM | POA: Diagnosis not present

## 2018-02-26 DIAGNOSIS — M25561 Pain in right knee: Secondary | ICD-10-CM | POA: Insufficient documentation

## 2018-02-26 DIAGNOSIS — Z87891 Personal history of nicotine dependence: Secondary | ICD-10-CM | POA: Diagnosis not present

## 2018-02-26 DIAGNOSIS — R5383 Other fatigue: Secondary | ICD-10-CM

## 2018-02-26 DIAGNOSIS — M7918 Myalgia, other site: Secondary | ICD-10-CM | POA: Diagnosis not present

## 2018-02-26 DIAGNOSIS — I10 Essential (primary) hypertension: Secondary | ICD-10-CM | POA: Insufficient documentation

## 2018-02-26 DIAGNOSIS — F319 Bipolar disorder, unspecified: Secondary | ICD-10-CM | POA: Insufficient documentation

## 2018-02-26 DIAGNOSIS — Z122 Encounter for screening for malignant neoplasm of respiratory organs: Secondary | ICD-10-CM

## 2018-02-26 NOTE — Telephone Encounter (Signed)
Scheduled appt per 8/6 los - pt aware - no print out wanted per patient request - my chart active.

## 2018-02-27 LAB — KAPPA/LAMBDA LIGHT CHAINS
KAPPA FREE LGHT CHN: 17.3 mg/L (ref 3.3–19.4)
Kappa, lambda light chain ratio: 1.4 (ref 0.26–1.65)
Lambda free light chains: 12.4 mg/L (ref 5.7–26.3)

## 2018-02-28 ENCOUNTER — Ambulatory Visit (INDEPENDENT_AMBULATORY_CARE_PROVIDER_SITE_OTHER): Payer: PPO | Admitting: Psychiatry

## 2018-02-28 ENCOUNTER — Encounter (HOSPITAL_COMMUNITY): Payer: Self-pay | Admitting: Psychiatry

## 2018-02-28 DIAGNOSIS — G4700 Insomnia, unspecified: Secondary | ICD-10-CM | POA: Diagnosis not present

## 2018-02-28 DIAGNOSIS — R450 Nervousness: Secondary | ICD-10-CM

## 2018-02-28 DIAGNOSIS — R5381 Other malaise: Secondary | ICD-10-CM

## 2018-02-28 DIAGNOSIS — Z811 Family history of alcohol abuse and dependence: Secondary | ICD-10-CM | POA: Diagnosis not present

## 2018-02-28 DIAGNOSIS — Z87891 Personal history of nicotine dependence: Secondary | ICD-10-CM

## 2018-02-28 DIAGNOSIS — Z813 Family history of other psychoactive substance abuse and dependence: Secondary | ICD-10-CM

## 2018-02-28 DIAGNOSIS — F4010 Social phobia, unspecified: Secondary | ICD-10-CM | POA: Diagnosis not present

## 2018-02-28 DIAGNOSIS — Z818 Family history of other mental and behavioral disorders: Secondary | ICD-10-CM | POA: Diagnosis not present

## 2018-02-28 DIAGNOSIS — F3160 Bipolar disorder, current episode mixed, unspecified: Secondary | ICD-10-CM | POA: Diagnosis not present

## 2018-02-28 LAB — MULTIPLE MYELOMA PANEL, SERUM
ALBUMIN/GLOB SERPL: 1.4 (ref 0.7–1.7)
Albumin SerPl Elph-Mcnc: 3.6 g/dL (ref 2.9–4.4)
Alpha 1: 0.2 g/dL (ref 0.0–0.4)
Alpha2 Glob SerPl Elph-Mcnc: 0.6 g/dL (ref 0.4–1.0)
B-GLOBULIN SERPL ELPH-MCNC: 1.2 g/dL (ref 0.7–1.3)
GAMMA GLOB SERPL ELPH-MCNC: 0.7 g/dL (ref 0.4–1.8)
Globulin, Total: 2.6 g/dL (ref 2.2–3.9)
IgA: 315 mg/dL (ref 90–386)
IgG (Immunoglobin G), Serum: 823 mg/dL (ref 700–1600)
IgM (Immunoglobulin M), Srm: 55 mg/dL (ref 20–172)
Total Protein ELP: 6.2 g/dL (ref 6.0–8.5)

## 2018-02-28 MED ORDER — LITHIUM CARBONATE 300 MG PO CAPS: 900.0000 mg | ORAL_CAPSULE | Freq: Every day | ORAL | 2 refills | Status: DC
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
## 2018-03-01 ENCOUNTER — Encounter: Payer: Self-pay | Admitting: Hematology

## 2018-03-05 ENCOUNTER — Encounter: Payer: Self-pay | Admitting: Hematology

## 2018-03-05 ENCOUNTER — Telehealth: Payer: Self-pay | Admitting: Hematology

## 2018-03-05 NOTE — Telephone Encounter (Signed)
LMVM with new appointment date/time.  I asked patient to call back to confirm appt per 8/10 sch msg

## 2018-03-06 ENCOUNTER — Other Ambulatory Visit: Payer: Self-pay

## 2018-03-06 ENCOUNTER — Encounter: Payer: Self-pay | Admitting: Hematology

## 2018-03-06 DIAGNOSIS — R778 Other specified abnormalities of plasma proteins: Secondary | ICD-10-CM | POA: Diagnosis not present

## 2018-03-07 ENCOUNTER — Ambulatory Visit (INDEPENDENT_AMBULATORY_CARE_PROVIDER_SITE_OTHER): Payer: PPO | Admitting: Rheumatology

## 2018-03-07 ENCOUNTER — Encounter: Payer: Self-pay | Admitting: Rheumatology

## 2018-03-07 VITALS — BP 115/67 | HR 96 | Resp 16 | Ht 75.5 in | Wt 223.4 lb

## 2018-03-07 DIAGNOSIS — R21 Rash and other nonspecific skin eruption: Secondary | ICD-10-CM | POA: Diagnosis not present

## 2018-03-07 DIAGNOSIS — I7300 Raynaud's syndrome without gangrene: Secondary | ICD-10-CM | POA: Diagnosis not present

## 2018-03-07 DIAGNOSIS — R778 Other specified abnormalities of plasma proteins: Secondary | ICD-10-CM | POA: Diagnosis not present

## 2018-03-07 DIAGNOSIS — M170 Bilateral primary osteoarthritis of knee: Secondary | ICD-10-CM | POA: Diagnosis not present

## 2018-03-07 LAB — UPEP/UIFE/LIGHT CHAINS/TP, 24-HR UR
% BETA, URINE: 0 %
ALBUMIN, U: 100 %
ALPHA 1 URINE: 0 %
Alpha 2, Urine: 0 %
Free Kappa Lt Chains,Ur: 16.8 mg/L (ref 1.35–24.19)
Free Kappa/Lambda Ratio: 15 — ABNORMAL HIGH (ref 2.04–10.37)
Free Lambda Lt Chains,Ur: 1.12 mg/L (ref 0.24–6.66)
GAMMA GLOBULIN URINE: 0 %
TOTAL PROTEIN, URINE-UPE24: 9.8 mg/dL
TOTAL PROTEIN, URINE-UR/DAY: 284 mg/(24.h) — AB (ref 30–150)
Total Volume: 2900

## 2018-03-07 NOTE — Progress Notes (Signed)
[facility]  [phone] [phone]  Clinic Follow-up Note   Patient Care Team: Ma Hillock, DO as PCP - General (Family Medicine) Philemon Kingdom, MD as Consulting Physician (Endocrinology) Bo Merino, MD as Consulting Physician (Rheumatology)   Date of Service:  03/12/2018    CHIEF COMPLAINTS:  Follow- up on abnormal SPEP  HISTORY OF PRESENTING ILLNESS:  Willie Hughes 57 y.o. male is a here because of abnormal SPEP. The patient was referred by Dr. Estanislado Pandy. The patient presents to the clinic today by himself.   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 He notes sometimes he may get culture on his skin, at some job sights he was not in gown or PPE and he thinks a colleague of his injected him with a culture solution. He notes he was also exposed to a mutagen. He plans to investigate this further if he is diagnosed with MM.   Today the patient notes leg pain bilaterally which has been going on for 3-4 years and progressed in the last 2 years. This varies in intensity and may hinder him walking. He notes this is both b/l knee and MSK related pain. Dr. Estanislado Pandy notes he has osteoarthritis in his knees. He plans to follow up with her next week. He notes he feels fatigued all the time and can barely function according to him. He has to force himself to do housework. He notes his teeth are rotting as he was not able to afford repairs before. He does not have a dentist. He denies any fever, chills or night sweats. HE denies blood in stool but notes being slightly constipated.  He notes he refuses any hospitalization or Psych ward admissions. He prefers outpatient procedures.   Socially he is single, with no kids. He lives alone and is out work on disability due to bipolar disorder. He was smoking for 35 years with 2 ppd and then vaping  for 5 years. He rarely drinks alcohol. He has a PMHx of HLD, HTN and Hypothyroidism, DM, and erectile dysfunction and COPD.   CURRENT THERAPY  Observation  INTERVAL HISTORY Willie Hughes is a 57 y.o. male who is here for follow up, discussed lab and scan results.  He is here alone. He is feeling moderately well. He reports that his energy level fluctuates. He denies having new symptoms or complaints.   MEDICAL HISTORY:  Past Medical History:  Diagnosis Date  . Acne   . Allergic rhinitis   . Allergy   . Bipolar disorder ([place])   . COPD (chronic obstructive pulmonary disease) ([place])   . Erectile dysfunction   . Frequent headaches   . Heart disease   . Heart murmur   . Hyperlipidemia   . Hypothyroidism   . Type 2 diabetes mellitus ([place])     SURGICAL HISTORY: Past Surgical History:  Procedure Laterality Date  . WISDOM TOOTH EXTRACTION      SOCIAL HISTORY: Social History   Socioeconomic History  . Marital status: Single    Spouse name: Not on file  . Number of children: 0  . Years of education: Not on file  . Highest education level: Not on file  Occupational History  . Not on file  Social Needs  . Financial resource strain: Not on file  . Food insecurity:    Worry: Not on file    Inability: Not on file  .  Transportation needs:    Medical: Not on file    Non-medical: Not on file  Tobacco Use  . Smoking status: Former Smoker    Packs/day: 2.00    Years: 35.00    Pack years: 70.00    Types: Cigarettes  . Smokeless tobacco: Never Used  . Tobacco comment: Vapor  Substance and Sexual Activity  . Alcohol use: Yes    Alcohol/week: 0.0 standard drinks    Comment: a few beers a month  . Drug use: Not Currently    Comment: prior h/o MJ use  . Sexual activity: Not Currently  Lifestyle  . Physical activity:    Days per week: Not on file    Minutes per session: Not on file  . Stress: Not on file  Relationships  . Social connections:    Talks on phone: Not on file     Gets together: Not on file    Attends religious service: Not on file    Active member of club or organization: Not on file    Attends meetings of clubs or organizations: Not on file    Relationship status: Not on file  . Intimate partner violence:    Fear of current or ex partner: Not on file    Emotionally abused: Not on file    Physically abused: Not on file    Forced sexual activity: Not on file  Other Topics Concern  . Not on file  Social History Narrative   Single and has one cat. Living in [place]. Never married. No kids.    Born and raised in [place] by parents. 2 sisters and 1 brother and pt is the youngest. Pt has 2 associates degrees in Loss adjuster, chartered   Disabled for Bipolar disorder.  Last worked 2008    Enjoys watching TV, playing on the Internet.    FAMILY HISTORY: Family History  Problem Relation Age of Onset  . Heart attack Father   . Stroke Father   . Mental illness Father   . Heart disease Father   . Drug abuse Father   . Depression Father   . Alcohol abuse Father   . Arthritis Father   . Stroke Mother   . Diabetes Mother   . Depression Mother   . Arthritis Mother   . Mental illness Mother   . Depression Sister   . Lupus Sister   . Bipolar disorder Paternal Aunt   . Lupus Sister   . Lupus Brother     ALLERGIES:  is allergic to kava kava and other.  MEDICATIONS:  Current Outpatient Medications  Medication Sig Dispense Refill  . albuterol (PROVENTIL HFA;VENTOLIN HFA) 108 (90 Base) MCG/ACT inhaler Inhale 2 puffs into the lungs every 4 (four) hours as needed for wheezing or shortness of breath. 1 Inhaler 0  . aspirin 325 MG tablet Take 325 mg by mouth daily.    Marland Kitchen dextromethorphan-guaiFENesin (MUCINEX DM) 30-600 MG 12hr tablet Take 1 tablet by mouth 2 (two) times daily.    . Fenofibrate 150 MG CAPS Take 1 capsule (150 mg total) by mouth daily. 90 each 3  . fexofenadine (ALLEGRA) 180 MG tablet Take 180 mg by mouth daily.     Marland Kitchen glimepiride  (AMARYL) 4 MG tablet Take 1 tablet (4 mg total) by mouth daily. 90 tablet 3  . glucose blood (ONE TOUCH TEST STRIPS) test strip Use as instructed to test blood sugar 3 times daily 300 each 2  . insulin NPH Human (HUMULIN  N,NOVOLIN N) 100 UNIT/ML injection Inject 10 Units into the skin 2 (two) times daily.     . Insulin Syringe-Needle U-100 (RELION INSULIN SYRINGE 1ML/31G) 31G X 5/16" 1 ML MISC Use with insulin twice daily as directed. 100 each 1  . levothyroxine (SYNTHROID, LEVOTHROID) 75 MCG tablet TAKE ONE TABLET BY MOUTH EVERY MORNING ON AN EMPTY STOMACH WITH A FULL GLASS OF WATER 90 tablet 3  . lithium carbonate 300 MG capsule Take 3 capsules (900 mg total) by mouth daily. 90 capsule 2  . metFORMIN (GLUCOPHAGE) 1000 MG tablet Take 1 tablet (1,000 mg total) by mouth 2 (two) times daily with a meal. 180 tablet 0  . Omega-3 Fatty Acids (EQL FISH OIL PO) Take 1,000 mg by mouth 3 (three) times daily.     Glory Rosebush DELICA LANCETS 25W MISC Use as instructed to test blood sugar 3 times daily 300 each 1  . ranitidine (ZANTAC) 150 MG tablet Take 150 mg by mouth 2 (two) times daily.    . sildenafil (VIAGRA) 100 MG tablet Take 1 tablet (100 mg total) by mouth daily as needed for erectile dysfunction. 16 tablet 5  . simvastatin (ZOCOR) 40 MG tablet Take 1 tablet (40 mg total) by mouth at bedtime. 90 tablet 3  . tretinoin (RETIN-A) 0.05 % cream APPLY  CREAM TOPICALLY TO AFFECTED AREA AS NEEDED 45 g 5  . vitamin C (ASCORBIC ACID) 500 MG tablet Take 500 mg by mouth daily.     No current facility-administered medications for this visit.     REVIEW OF SYSTEMS:  Constitutional: Denies fevers, chills or abnormal night sweats (+) fatigue  Eyes: Denies blurriness of vision, double vision or watery eyes Ears, nose, mouth, throat, and face: Denies mucositis or sore throat (+) teeth decay Respiratory: Denies cough, dyspnea or wheezes Cardiovascular: Denies palpitation, chest discomfort or lower extremity  swelling Gastrointestinal:  Denies nausea, heartburn or change in bowel habits   Skin: Denies abnormal skin rashes MKS: No myalgia or joint pain Lymphatics: Denies new lymphadenopathy or easy bruising Neurological:Denies numbness, tingling or new weaknesses Behavioral/Psych: Mood is stable, no new changes (+) Bipolar disorder  All other systems were reviewed with the patient and are negative.  PHYSICAL EXAMINATION: ECOG PERFORMANCE STATUS: 2 - Symptomatic, <50% confined to bed  Vitals:   03/12/18 1059  BP: (!) 150/91  Pulse: 98  Resp: 18  Temp: 98.6 F (37 C)  SpO2: 100%   Filed Weights   03/12/18 1059  Weight: 223 lb 3.2 oz (101.2 kg)    GENERAL:alert, no distress and comfortable SKIN: skin color, texture, turgor are normal, no rashes or significant lesions EYES: normal, conjunctiva are pink and non-injected, sclera clear OROPHARYNX:no exudate, no erythema and lips, buccal mucosa, and tongue normal  NECK: supple, thyroid normal size, non-tender, without nodularity LYMPH:  no palpable lymphadenopathy in the cervical, axillary or inguinal LUNGS: clear to auscultation and percussion with normal breathing effort HEART: regular rate & rhythm and no murmurs and no lower extremity edema ABDOMEN:abdomen soft, non-tender and normal bowel sounds Musculoskeletal:no cyanosis of digits and no clubbing  PSYCH: alert & oriented x 3 with fluent speech NEURO: no focal motor/sensory deficits  LABORATORY DATA:  I have reviewed the data as listed CBC Latest Ref Rng & Units 11/01/2017 04/18/2017 02/09/2016  WBC 4.0 - 10.5 K/uL 8.0 6.8 8.8  Hemoglobin 13.0 - 17.0 g/dL 15.4 15.0 14.8  Hematocrit 39.0 - 52.0 % 46.1 44.3 44.0  Platelets 150.0 - 400.0 K/uL 331.0  338 324.0    CMP Latest Ref Rng & Units 01/29/2018 12/19/2017 11/22/2017  Glucose 70 - 99 mg/dL - 277(H) 290(H)  BUN 6 - 23 mg/dL - 9 9  Creatinine 0.40 - 1.50 mg/dL - 0.86 0.79  Sodium 135 - 145 mEq/L - 136 140  Potassium 3.5 - 5.1 mEq/L  - 4.2 4.5  Chloride 96 - 112 mEq/L - 100 102  CO2 19 - 32 mEq/L - 26 20  Calcium 8.4 - 10.5 mg/dL - 10.2 9.9  Total Protein 6.1 - 8.1 g/dL 7.0 7.5 6.8  Total Bilirubin 0.2 - 1.2 mg/dL - 0.4 0.3  Alkaline Phos 39 - 117 U/L - 80 120(H)  AST 0 - 37 U/L - 33 42(H)  ALT 0 - 53 U/L - 33 51(H)   24-Hr Urine UPEP 03/06/2018 Total protein: 284 Free Kappa/Lambda Ratio : 15 Urine M-Spike%: Not Observed  IgG (Immunoglobin G), Serum 700 - 1,600 mg/dL 823      IgA 90 - 386 mg/dL 315      IgM (Immunoglobulin M), Srm 20 - 172 mg/dL 55      Total Protein ELP 6.0 - 8.5 g/dL 6.2 VC     Albumin SerPl Elph-Mcnc 2.9 - 4.4 g/dL 3.6 VC     Alpha 1 0.0 - 0.4 g/dL 0.2 VC 0.2 R    Alpha2 Glob SerPl Elph-Mcnc 0.4 - 1.0 g/dL 0.6 VC     B-Globulin SerPl Elph-Mcnc 0.7 - 1.3 g/dL 1.2 VC     Gamma Glob SerPl Elph-Mcnc 0.4 - 1.8 g/dL 0.7 VC     M Protein SerPl Elph-Mcnc Not Observed g/dL Not Observed VC     Globulin, Total 2.2 - 3.9 g/dL 2.6 VC  2.2 R 2.1 R  Albumin/Glob SerPl 0.7 - 1.7 1.4 VC     IFE 1  Comment VC     Comment: An apparent normal immunofixation pattern.     RADIOGRAPHIC STUDIES: I have personally reviewed the radiological images as listed and agreed with the findings in the report.  03/11/2018 CT Chest WO Contrast IMPRESSION: Lung-RADS 2, benign appearance or behavior. Continue annual screening with Hughes-dose chest CT without contrast in 12 months.  ASSESSMENT & PLAN:  Willie Hughes is a 57 y.o. caucasian male with a history of bipolar disorder, HLD, Hypothyroidism, DM type II, erectile dysfunction, and COPD.    1. Questionable abnormal SPEP -I reviewed his outside labs results and medical history.  He was referred to rheumatologist Dr. Estanislado Pandy to rule out lupus due to his family history of lupus and recently developed bilateral lower extremity pain.  As part of the work-up, SPEP was ordered. No M protein was detected, however his immunofixation showed faint band of lambda light chain.   Light chain levels has not been tested. No urine UPEP was tested also.  He had a 1 episode of mild hypercalcemia on his lab, no significant renal insufficiency or anemia.  Clinically he complains of bilateral lower extremity pain and fatigue, no significant other bone pain.  -My suspicion for multiple myeloma is very Hughes, although he may have MGUS.  -Repeated SPEP with IFE, light chain level, globulin level, and 24 hour urine were all negative.  No evidence of MGUS or multiple myeloma. -No need for further work-up for this.  2. History of heavy smoking -Given his extensive smoking history he qualifies for lung cancer screening with chest CT. He is interested in screening.  -he has quit smoking in 2014  -Ordered screening CT  chest for lung cancer screening, which showed small bilateral pulmonary nodules, measuring up to 4.5 mm.  No high suspicion for malignancy. -I recommend second and screening CT in 12 months, and f/u lung nodules.  I suggest to him to discuss with his primary care physician to order the scan.    3. Bipolar Disorder -He notes given his fatigue he has been more depressed but not as severe as before -He regularly sees his psychiatrist, he is on lithium    4. B/l Leg pain, secondary to arthritis in knees and muscular pain -This pain has been going on for 4 years and progressed in the last 2 years -f/u with Dr. Estanislado Pandy    PLAN:  -I discussed his recent lab and CT scan, no concerns.  He does not need follow-up with me.   -I advised him to f/u with PCP, and have a repeated screening CT chest without contrast in 12 months. -See him as needed in the future.   All questions were answered. The patient knows to call the clinic with any problems, questions or concerns. I spent 15 minutes counseling the patient face to face. The total time spent in the appointment was 20 minutes and more than 50% was on counseling.  Dierdre Searles Dweik am acting as scribe for Dr. Truitt Merle.  I have  reviewed the above documentation for accuracy and completeness, and I agree with the above.     Truitt Merle, MD  03/12/2018

## 2018-03-08 ENCOUNTER — Telehealth: Payer: Self-pay | Admitting: Hematology

## 2018-03-08 ENCOUNTER — Ambulatory Visit (INDEPENDENT_AMBULATORY_CARE_PROVIDER_SITE_OTHER): Payer: PPO | Admitting: Family Medicine

## 2018-03-08 ENCOUNTER — Encounter: Payer: Self-pay | Admitting: Family Medicine

## 2018-03-08 VITALS — BP 121/79 | HR 86 | Temp 98.4°F | Resp 20 | Ht 76.0 in | Wt 221.0 lb

## 2018-03-08 DIAGNOSIS — Z23 Encounter for immunization: Secondary | ICD-10-CM | POA: Diagnosis not present

## 2018-03-08 DIAGNOSIS — Z1211 Encounter for screening for malignant neoplasm of colon: Secondary | ICD-10-CM | POA: Diagnosis not present

## 2018-03-08 DIAGNOSIS — N529 Male erectile dysfunction, unspecified: Secondary | ICD-10-CM | POA: Diagnosis not present

## 2018-03-08 DIAGNOSIS — E782 Mixed hyperlipidemia: Secondary | ICD-10-CM | POA: Diagnosis not present

## 2018-03-08 DIAGNOSIS — Z0000 Encounter for general adult medical examination without abnormal findings: Secondary | ICD-10-CM

## 2018-03-08 DIAGNOSIS — E785 Hyperlipidemia, unspecified: Secondary | ICD-10-CM | POA: Diagnosis not present

## 2018-03-08 DIAGNOSIS — Z125 Encounter for screening for malignant neoplasm of prostate: Secondary | ICD-10-CM

## 2018-03-08 MED ORDER — FENOFIBRATE 150 MG PO CAPS: 150.0000 mg | ORAL_CAPSULE | Freq: Every day | ORAL | 3 refills | Status: AC

## 2018-03-08 MED ORDER — SILDENAFIL CITRATE 100 MG PO TABS: 100.0000 mg | ORAL_TABLET | Freq: Every day | ORAL | 5 refills | Status: DC | PRN

## 2018-03-08 MED ORDER — SIMVASTATIN 40 MG PO TABS
40.0000 mg | ORAL_TABLET | Freq: Every day | ORAL | 3 refills | Status: AC
Start: 2018-03-08 — End: ?

## 2018-03-08 NOTE — Telephone Encounter (Signed)
Per YF changed time of 8/20 f/u to 11:15 am. Left message for patient.
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
## 2018-03-08 NOTE — Patient Instructions (Addendum)
I have refilled the medications we provide.  Printed the script you requested.  We ordered the cologuard--> they will send this to your home.  You received the flu shot and your pneumonia shot updated today.   Health Maintenance, Male A healthy lifestyle and preventive care is important for your health and wellness. Ask your health care provider about what schedule of regular examinations is right for you. What should I know about weight and diet? Eat a Healthy Diet  Eat plenty of vegetables, fruits, whole grains, low-fat dairy products, and lean protein.  Do not eat a lot of foods high in solid fats, added sugars, or salt.  Maintain a Healthy Weight Regular exercise can help you achieve or maintain a healthy weight. You should:  Do at least 150 minutes of exercise each week. The exercise should increase your heart rate and make you sweat (moderate-intensity exercise).  Do strength-training exercises at least twice a week.  Watch Your Levels of Cholesterol and Blood Lipids  Have your blood tested for lipids and cholesterol every 5 years starting at 10735 years of age. If you are at high risk for heart disease, you should start having your blood tested when you are 57 years old. You may need to have your cholesterol levels checked more often if: ? Your lipid or cholesterol levels are high. ? You are older than 57 years of age. ? You are at high risk for heart disease.  What should I know about cancer screening? Many types of cancers can be detected early and may often be prevented. Lung Cancer  You should be screened every year for lung cancer if: ? You are a current smoker who has smoked for at least 30 years. ? You are a former smoker who has quit within the past 15 years.  Talk to your health care provider about your screening options, when you should start screening, and how often you should be screened.  Colorectal Cancer  Routine colorectal cancer screening usually begins at  57 years of age and should be repeated every 5-10 years until you are 57 years old. You may need to be screened more often if early forms of precancerous polyps or small growths are found. Your health care provider may recommend screening at an earlier age if you have risk factors for colon cancer.  Your health care provider may recommend using home test kits to check for hidden blood in the stool.  A small camera at the end of a tube can be used to examine your colon (sigmoidoscopy or colonoscopy). This checks for the earliest forms of colorectal cancer.  Prostate and Testicular Cancer  Depending on your age and overall health, your health care provider may do certain tests to screen for prostate and testicular cancer.  Talk to your health care provider about any symptoms or concerns you have about testicular or prostate cancer.  Skin Cancer  Check your skin from head to toe regularly.  Tell your health care provider about any new moles or changes in moles, especially if: ? There is a change in a mole's size, shape, or color. ? You have a mole that is larger than a pencil eraser.  Always use sunscreen. Apply sunscreen liberally and repeat throughout the day.  Protect yourself by wearing long sleeves, pants, a wide-brimmed hat, and sunglasses when outside.  What should I know about heart disease, diabetes, and high blood pressure?  If you are 618-57 years of age, have your blood pressure  checked every 3-5 years. If you are 63 years of age or older, have your blood pressure checked every year. You should have your blood pressure measured twice-once when you are at a hospital or clinic, and once when you are not at a hospital or clinic. Record the average of the two measurements. To check your blood pressure when you are not at a hospital or clinic, you can use: ? An automated blood pressure machine at a pharmacy. ? A home blood pressure monitor.  Talk to your health care provider about  your target blood pressure.  If you are between 58-62 years old, ask your health care provider if you should take aspirin to prevent heart disease.  Have regular diabetes screenings by checking your fasting blood sugar level. ? If you are at a normal weight and have a low risk for diabetes, have this test once every three years after the age of 66. ? If you are overweight and have a high risk for diabetes, consider being tested at a younger age or more often.  A one-time screening for abdominal aortic aneurysm (AAA) by ultrasound is recommended for men aged 52-75 years who are current or former smokers. What should I know about preventing infection? Hepatitis B If you have a higher risk for hepatitis B, you should be screened for this virus. Talk with your health care provider to find out if you are at risk for hepatitis B infection. Hepatitis C Blood testing is recommended for:  Everyone born from [birth year] through 1965.  Anyone with known risk factors for hepatitis C.  Sexually Transmitted Diseases (STDs)  You should be screened each year for STDs including gonorrhea and chlamydia if: ? You are sexually active and are younger than 57 years of age. ? You are older than 57 years of age and your health care provider tells you that you are at risk for this type of infection. ? Your sexual activity has changed since you were last screened and you are at an increased risk for chlamydia or gonorrhea. Ask your health care provider if you are at risk.  Talk with your health care provider about whether you are at high risk of being infected with HIV. Your health care provider may recommend a prescription medicine to help prevent HIV infection.  What else can I do?  Schedule regular health, dental, and eye exams.  Stay current with your vaccines (immunizations).  Do not use any tobacco products, such as cigarettes, chewing tobacco, and e-cigarettes. If you need help quitting, ask your health care  provider.  Limit alcohol intake to no more than 2 drinks per day. One drink equals 12 ounces of beer, 5 ounces of wine, or 1 ounces of hard liquor.  Do not use street drugs.  Do not share needles.  Ask your health care provider for help if you need support or information about quitting drugs.  Tell your health care provider if you often feel depressed.  Tell your health care provider if you have ever been abused or do not feel safe at home. This information is not intended to replace advice given to you by your health care provider. Make sure you discuss any questions you have with your health care provider. Document Released: 01/06/2008 Document Revised: 03/08/2016 Document Reviewed: 04/13/2015 Elsevier Interactive Patient Education  [employer].

## 2018-03-09 LAB — LIPID PANEL
Cholesterol: 123 mg/dL (ref <200)
HDL: 29 mg/dL — ABNORMAL LOW (ref >40)
LDL Cholesterol (Calc): 66 mg/dL
Non-HDL Cholesterol (Calc): 94 mg/dL (ref <130)
Total CHOL/HDL Ratio: 4.2 (calc) (ref <5.0)
Triglycerides: 228 mg/dL — ABNORMAL HIGH (ref <150)

## 2018-03-09 LAB — PSA: PSA: 0.3 ng/mL (ref <=4.0)

## 2018-03-11 ENCOUNTER — Other Ambulatory Visit: Payer: Self-pay | Admitting: Hematology

## 2018-03-11 ENCOUNTER — Ambulatory Visit (HOSPITAL_COMMUNITY)
Admission: RE | Admit: 2018-03-11 | Discharge: 2018-03-11 | Disposition: A | Discharge status: Home / Self Care | Payer: PPO | Source: Ambulatory Visit | Attending: Hematology | Admitting: Hematology

## 2018-03-11 DIAGNOSIS — Z122 Encounter for screening for malignant neoplasm of respiratory organs: Secondary | ICD-10-CM | POA: Insufficient documentation

## 2018-03-11 DIAGNOSIS — Z87891 Personal history of nicotine dependence: Secondary | ICD-10-CM | POA: Diagnosis not present

## 2018-03-12 ENCOUNTER — Encounter: Payer: Self-pay | Admitting: Hematology

## 2018-03-12 ENCOUNTER — Inpatient Hospital Stay (HOSPITAL_BASED_OUTPATIENT_CLINIC_OR_DEPARTMENT_OTHER): Payer: PPO | Admitting: Hematology

## 2018-03-12 VITALS — BP 150/91 | HR 98 | Temp 98.6°F | Resp 18 | Ht 76.0 in | Wt 223.2 lb

## 2018-03-12 DIAGNOSIS — M25561 Pain in right knee: Secondary | ICD-10-CM | POA: Diagnosis not present

## 2018-03-12 DIAGNOSIS — E119 Type 2 diabetes mellitus without complications: Secondary | ICD-10-CM | POA: Diagnosis not present

## 2018-03-12 DIAGNOSIS — F319 Bipolar disorder, unspecified: Secondary | ICD-10-CM

## 2018-03-12 DIAGNOSIS — M170 Bilateral primary osteoarthritis of knee: Secondary | ICD-10-CM

## 2018-03-12 DIAGNOSIS — I10 Essential (primary) hypertension: Secondary | ICD-10-CM | POA: Diagnosis not present

## 2018-03-12 DIAGNOSIS — M25562 Pain in left knee: Secondary | ICD-10-CM | POA: Diagnosis not present

## 2018-03-12 DIAGNOSIS — Z87891 Personal history of nicotine dependence: Secondary | ICD-10-CM

## 2018-03-12 DIAGNOSIS — R778 Other specified abnormalities of plasma proteins: Secondary | ICD-10-CM

## 2018-03-13 ENCOUNTER — Encounter: Payer: Self-pay | Admitting: Family Medicine

## 2018-03-13 ENCOUNTER — Telehealth: Payer: Self-pay | Admitting: *Deleted

## 2018-03-13 DIAGNOSIS — R911 Solitary pulmonary nodule: Secondary | ICD-10-CM | POA: Insufficient documentation

## 2018-03-13 NOTE — Telephone Encounter (Signed)
Received [facility] lab results. Reviewed by Dr. Corliss Skainseveshwar and results are normal . Spoke with patient and advised of results. Patient wants to know if he should return to his PCP to further evaluate what is going on with him. Please advise.

## 2018-03-13 NOTE — Telephone Encounter (Signed)
Patient advised he should follow up with PCP as rheumatologic work up is negative.  [facility] labs faxed to PCP.

## 2018-03-13 NOTE — Telephone Encounter (Signed)
Please advise him to contact his PCP. Fax [facility] lab results to his PCP.

## 2018-03-14 ENCOUNTER — Telehealth: Payer: Self-pay | Admitting: Hematology

## 2018-03-14 NOTE — Telephone Encounter (Signed)
F/u open per 8/20 los

## 2018-03-21 ENCOUNTER — Other Ambulatory Visit: Payer: Self-pay

## 2018-03-21 ENCOUNTER — Emergency Department (INDEPENDENT_AMBULATORY_CARE_PROVIDER_SITE_OTHER)
Admission: EM | Admit: 2018-03-21 | Discharge: 2018-03-21 | Disposition: A | Discharge status: Home / Self Care | Payer: PPO | Source: Home / Self Care

## 2018-03-21 ENCOUNTER — Encounter: Payer: Self-pay | Admitting: Family Medicine

## 2018-03-21 ENCOUNTER — Ambulatory Visit: Payer: Self-pay | Admitting: Hematology

## 2018-03-21 DIAGNOSIS — L700 Acne vulgaris: Secondary | ICD-10-CM

## 2018-03-21 MED ORDER — DOXYCYCLINE HYCLATE 100 MG PO TABS: 100.0000 mg | ORAL_TABLET | Freq: Two times a day (BID) | ORAL | 0 refills | Status: DC

## 2018-03-21 NOTE — Discharge Instructions (Addendum)
The doxycycline is a photosensitizer, as you know.  It's best taken with food.

## 2018-03-21 NOTE — ED Triage Notes (Signed)
Patient reports increased redness and signs of possible infection on face over past 2-3 days.

## 2018-03-21 NOTE — ED Provider Notes (Signed)
[facility]    CSN: [id number] Arrival date & time: 03/21/18  1657     History   Chief Complaint Chief Complaint  Patient presents with  . Recurrent Skin Infections    HPI Willie Hughes is a 57 y.o. male.   HPI This 57 year old man presents with 2 to 3 days of increasing acne on his face and one lesion on his mid sternum.  He has had this problem in the past, most recently a couple years ago.  Generally he uses Retin-A cream and he has been relying on this the last couple days but the facial redness and tenderness along the side of his nose has gotten worse.  Patient states that his bipolar medicine sometimes seems related to the acne flare and it also gives him a mild tremor.  Nevertheless, lithium does control his bipolar symptoms the best. Past Medical History:  Diagnosis Date  . Acne   . Allergic rhinitis   . Allergy   . Bipolar disorder (HCC)   . COPD (chronic obstructive pulmonary disease) (HCC)   . Erectile dysfunction   . Frequent headaches   . Heart disease   . Heart murmur   . Hyperlipidemia   . Hypothyroidism   . Type 2 diabetes mellitus [facility])     Patient Active Problem List   Diagnosis Date Noted  . Pulmonary nodule 03/13/2018  . Abnormal SPEP 02/26/2018  . Uncontrolled type 2 diabetes mellitus with peripheral neuropathy (HCC) 11/20/2017  . History of tobacco abuse 02/01/2017  . Arthritis 12/20/2016  . Medicare annual wellness visit, subsequent 10/11/2016  . Chronic allergic rhinitis 09/20/2016  . Chronic headaches 09/20/2016  . Hypothyroidism 01/28/2016  . Hyperlipidemia 12/15/2015  . Erectile dysfunction 12/15/2015  . Bipolar disorder (HCC) 12/15/2015    Past Surgical History:  Procedure Laterality Date  . WISDOM TOOTH EXTRACTION         Home Medications    Prior to Admission medications   Medication Sig Start Date End Date Taking? Authorizing Provider  albuterol (PROVENTIL HFA;VENTOLIN HFA) 108 (90 Base) MCG/ACT inhaler Inhale  2 puffs into the lungs every 4 (four) hours as needed for wheezing or shortness of breath. 02/02/17   Doreene Nest, NP  aspirin 325 MG tablet Take 325 mg by mouth daily.    [provider]  doxycycline (VIBRA-TABS) 100 MG tablet Take 1 tablet (100 mg total) by mouth 2 (two) times daily. 03/21/18   Elvina Sidle, MD  Fenofibrate 150 MG CAPS Take 1 capsule (150 mg total) by mouth daily. 03/08/18   Kuneff, Renee A, DO  fexofenadine (ALLEGRA) 180 MG tablet Take 180 mg by mouth daily.     [provider]  glimepiride (AMARYL) 4 MG tablet Take 1 tablet (4 mg total) by mouth daily. 12/25/17   Carlus Pavlov, MD  glucose blood (ONE TOUCH TEST STRIPS) test strip Use as instructed to test blood sugar 3 times daily 04/26/17   Doreene Nest, NP  insulin NPH Human (HUMULIN N,NOVOLIN N) 100 UNIT/ML injection Inject 10 Units into the skin 2 (two) times daily.     [provider]  Insulin Syringe-Needle U-100 (RELION INSULIN SYRINGE 1ML/31G) 31G X 5/16" 1 ML MISC Use with insulin twice daily as directed. 07/11/17   Doreene Nest, NP  levothyroxine (SYNTHROID, LEVOTHROID) 75 MCG tablet TAKE ONE TABLET BY MOUTH EVERY MORNING ON AN EMPTY STOMACH WITH A FULL GLASS OF WATER 12/24/17   Kuneff, Renee A, DO  lithium carbonate 300  MG capsule Take 3 capsules (900 mg total) by mouth daily. 02/28/18   Oletta DarterAgarwal, [place], MD  metFORMIN (GLUCOPHAGE) 1000 MG tablet Take 1 tablet (1,000 mg total) by mouth 2 (two) times daily with a meal. 01/16/18   Carlus PavlovGherghe, Cristina, MD  Omega-3 Fatty Acids (EQL FISH OIL PO) Take 1,000 mg by mouth 3 (three) times daily.     [provider]  Dola ArgyleNETOUCH DELICA LANCETS 33G MISC Use as instructed to test blood sugar 3 times daily 06/19/17   Doreene Nestlark, Katherine K, NP  ranitidine (ZANTAC) 150 MG tablet Take 150 mg by mouth 2 (two) times daily.    [provider]  sildenafil (VIAGRA) 100 MG tablet Take 1 tablet (100 mg total) by mouth daily as needed for  erectile dysfunction. 03/08/18   Kuneff, Renee A, DO  simvastatin (ZOCOR) 40 MG tablet Take 1 tablet (40 mg total) by mouth at bedtime. 03/08/18   Kuneff, Renee A, DO  tretinoin (RETIN-A) 0.05 % cream APPLY  CREAM TOPICALLY TO AFFECTED AREA AS NEEDED 12/24/17   Kuneff, Renee A, DO  vitamin C (ASCORBIC ACID) 500 MG tablet Take 500 mg by mouth daily.    [provider]    Family History Family History  Problem Relation Age of Onset  . Heart attack Father   . Stroke Father   . Mental illness Father   . Heart disease Father   . Drug abuse Father   . Depression Father   . Alcohol abuse Father   . Arthritis Father   . Stroke Mother   . Diabetes Mother   . Depression Mother   . Arthritis Mother   . Mental illness Mother   . Depression Sister   . Lupus Sister   . Bipolar disorder Paternal Aunt   . Lupus Sister   . Lupus Brother     Social History Social History   Tobacco Use  . Smoking status: Former Smoker    Packs/day: 2.00    Years: 35.00    Pack years: 70.00    Types: Cigarettes  . Smokeless tobacco: Never Used  . Tobacco comment: Vapor  Substance Use Topics  . Alcohol use: Yes    Alcohol/week: 0.0 standard drinks    Comment: a few beers a month  . Drug use: Not Currently    Comment: prior h/o MJ use     Allergies   Kava kava and Other   Review of Systems Review of Systems  Constitutional: Negative.   HENT: Negative.   Skin: Positive for color change and rash.  Neurological: Positive for tremors.     Physical Exam Triage Vital Signs ED Triage Vitals  Enc Vitals Group     BP 03/21/18 1711 121/76     Pulse Rate 03/21/18 1711 100     Resp 03/21/18 1711 18     Temp 03/21/18 1711 98.7 F (37.1 C)     Temp Source 03/21/18 1711 Oral     SpO2 03/21/18 1711 96 %     Weight 03/21/18 1714 222 lb (100.7 kg)     Height 03/21/18 1714 6\' 4"  (1.93 m)     Head Circumference --      Peak Flow --      Pain Score 03/21/18 1714 0     Pain Loc --      Pain  Edu? --      Excl. in GC? --    No data found.  Updated Vital Signs BP 121/76 (BP Location:  Right Arm)   Pulse 100   Temp 98.7 F (37.1 C) (Oral)   Resp 18   Ht 6\' 4"  (1.93 m)   Wt 100.7 kg   SpO2 96%   BMI 27.02 kg/m   Visual Acuity Right Eye Distance:   Left Eye Distance:   Bilateral Distance:    Right Eye Near:   Left Eye Near:    Bilateral Near:     Physical Exam  Constitutional: He appears well-developed and well-nourished.  HENT:  Head: Atraumatic.  Right Ear: External ear normal.  Left Ear: External ear normal.  Eyes: Pupils are equal, round, and reactive to light. Conjunctivae are normal.  Left exophoria  Neck: Normal range of motion. Neck supple.  Pulmonary/Chest: Effort normal.  Musculoskeletal: Normal range of motion.  Neurological: He is alert.  Fine bilateral hand tremor  Skin: Skin is warm and dry. There is erythema.  Diffuse facial erythema with mild swelling at the nasolabial folds which is tender.  He also has a blemish with overlying scab on the midsternal  Psychiatric: He has a normal mood and affect. His behavior is normal. Judgment and thought content normal.  Nursing note and vitals reviewed.    UC Treatments / Results  Labs (all labs ordered are listed, but only abnormal results are displayed) Labs Reviewed - No data to display  EKG None  Radiology No results found.  Procedures Procedures (including critical care time)  Medications Ordered in UC Medications - No data to display  Initial Impression / Assessment and Plan / UC Course  I have reviewed the triage vital signs and the nursing notes.  Pertinent labs & imaging results that were available during my care of the patient were reviewed by me and considered in my medical decision making (see chart for details).    Final Clinical Impressions(s) / UC Diagnoses   Final diagnoses:  Acne cystica     Discharge Instructions     The doxycycline is a photosensitizer, as you  know.  It's best taken with food.    ED Prescriptions    Medication Sig Dispense Auth. Provider   doxycycline (VIBRA-TABS) 100 MG tablet Take 1 tablet (100 mg total) by mouth 2 (two) times daily. 20 tablet Elvina Sidle, MD     Controlled Substance Prescriptions [place] Controlled Substance Registry consulted? Not Applicable   Elvina Sidle, MD 03/21/18 [phone]

## 2018-03-23 ENCOUNTER — Telehealth: Payer: Self-pay

## 2018-03-23 NOTE — Telephone Encounter (Signed)
Spoke to pt. States infection on face is improving. Advised to call if any questions.

## 2018-03-25 ENCOUNTER — Encounter: Payer: Self-pay | Admitting: Family Medicine

## 2018-03-27 ENCOUNTER — Encounter: Payer: Self-pay | Admitting: Emergency Medicine

## 2018-03-27 ENCOUNTER — Emergency Department (INDEPENDENT_AMBULATORY_CARE_PROVIDER_SITE_OTHER)
Admission: EM | Admit: 2018-03-27 | Discharge: 2018-03-27 | Disposition: A | Discharge status: Home / Self Care | Payer: PPO | Source: Home / Self Care | Attending: Family Medicine | Admitting: Family Medicine

## 2018-03-27 DIAGNOSIS — H6691 Otitis media, unspecified, right ear: Secondary | ICD-10-CM | POA: Diagnosis not present

## 2018-03-27 DIAGNOSIS — H60311 Diffuse otitis externa, right ear: Secondary | ICD-10-CM

## 2018-03-27 MED ORDER — CLINDAMYCIN HCL 300 MG PO CAPS: ORAL_CAPSULE | ORAL | 0 refills | Status: DC

## 2018-03-27 MED ORDER — NEOMYCIN-POLYMYXIN-HC 3.5-10000-1 OT SUSP: 4.0000 [drp] | Freq: Three times a day (TID) | OTIC | 0 refills | Status: DC

## 2018-03-27 NOTE — ED Provider Notes (Signed)
[facility]    CSN: [id number] Arrival date & time: 03/27/18  1138     History   Chief Complaint Chief Complaint  Patient presents with  . Otalgia    HPI Willie Hughes is a 57 y.o. male.   Patient has been taking doxycycline for the past five days for acne which has been improving.  He complains of two to three day history of right earache and persistent drainage from the right ear.  He denies fevers, chills, and sweats.  No sinus congestion. No recent URI symptoms.  He denies swimming recently.  The history is provided by the patient.    Past Medical History:  Diagnosis Date  . Acne   . Allergic rhinitis   . Allergy   . Bipolar disorder (HCC)   . COPD (chronic obstructive pulmonary disease) (HCC)   . Erectile dysfunction   . Frequent headaches   . Heart disease   . Heart murmur   . Hyperlipidemia   . Hypothyroidism   . Type 2 diabetes mellitus [facility])     Patient Active Problem List   Diagnosis Date Noted  . Pulmonary nodule 03/13/2018  . Abnormal SPEP 02/26/2018  . Uncontrolled type 2 diabetes mellitus with peripheral neuropathy (HCC) 11/20/2017  . History of tobacco abuse 02/01/2017  . Arthritis 12/20/2016  . Medicare annual wellness visit, subsequent 10/11/2016  . Chronic allergic rhinitis 09/20/2016  . Chronic headaches 09/20/2016  . Hypothyroidism 01/28/2016  . Hyperlipidemia 12/15/2015  . Erectile dysfunction 12/15/2015  . Bipolar disorder (HCC) 12/15/2015    Past Surgical History:  Procedure Laterality Date  . WISDOM TOOTH EXTRACTION         Home Medications    Prior to Admission medications   Medication Sig Start Date End Date Taking? Authorizing Provider  albuterol (PROVENTIL HFA;VENTOLIN HFA) 108 (90 Base) MCG/ACT inhaler Inhale 2 puffs into the lungs every 4 (four) hours as needed for wheezing or shortness of breath. 02/02/17   Doreene Nest, NP  aspirin 325 MG tablet Take 325 mg by mouth daily.    [provider]    clindamycin (CLEOCIN) 300 MG capsule Take one cap by mouth every 8 hours 03/27/18   Lattie Haw, MD  doxycycline (VIBRA-TABS) 100 MG tablet Take 1 tablet (100 mg total) by mouth 2 (two) times daily. 03/21/18   Elvina Sidle, MD  Fenofibrate 150 MG CAPS Take 1 capsule (150 mg total) by mouth daily. 03/08/18   Kuneff, Renee A, DO  fexofenadine (ALLEGRA) 180 MG tablet Take 180 mg by mouth daily.     [provider]  glimepiride (AMARYL) 4 MG tablet Take 1 tablet (4 mg total) by mouth daily. 12/25/17   Carlus Pavlov, MD  glucose blood (ONE TOUCH TEST STRIPS) test strip Use as instructed to test blood sugar 3 times daily 04/26/17   Doreene Nest, NP  insulin NPH Human (HUMULIN N,NOVOLIN N) 100 UNIT/ML injection Inject 10 Units into the skin 2 (two) times daily.     [provider]  Insulin Syringe-Needle U-100 (RELION INSULIN SYRINGE 1ML/31G) 31G X 5/16" 1 ML MISC Use with insulin twice daily as directed. 07/11/17   Doreene Nest, NP  levothyroxine (SYNTHROID, LEVOTHROID) 75 MCG tablet TAKE ONE TABLET BY MOUTH EVERY MORNING ON AN EMPTY STOMACH WITH A FULL GLASS OF WATER 12/24/17   Kuneff, Renee A, DO  lithium carbonate 300 MG capsule Take 3 capsules (900 mg total) by mouth daily. 02/28/18   Oletta Darter,  MD  metFORMIN (GLUCOPHAGE) 1000 MG tablet Take 1 tablet (1,000 mg total) by mouth 2 (two) times daily with a meal. 01/16/18   Carlus Pavlov, MD  neomycin-polymyxin-hydrocortisone (CORTISPORIN) 3.5-10000-1 OTIC suspension Place 4 drops into the right ear 3 (three) times daily. 03/27/18   Lattie Haw, MD  Omega-3 Fatty Acids (EQL FISH OIL PO) Take 1,000 mg by mouth 3 (three) times daily.     [provider]  Dola Argyle LANCETS 33G MISC Use as instructed to test blood sugar 3 times daily 06/19/17   Doreene Nest, NP  ranitidine (ZANTAC) 150 MG tablet Take 150 mg by mouth 2 (two) times daily.    [provider]  sildenafil (VIAGRA) 100 MG  tablet Take 1 tablet (100 mg total) by mouth daily as needed for erectile dysfunction. 03/08/18   Kuneff, Renee A, DO  simvastatin (ZOCOR) 40 MG tablet Take 1 tablet (40 mg total) by mouth at bedtime. 03/08/18   Kuneff, Renee A, DO  tretinoin (RETIN-A) 0.05 % cream APPLY  CREAM TOPICALLY TO AFFECTED AREA AS NEEDED 12/24/17   Kuneff, Renee A, DO  vitamin C (ASCORBIC ACID) 500 MG tablet Take 500 mg by mouth daily.    [provider]    Family History Family History  Problem Relation Age of Onset  . Heart attack Father   . Stroke Father   . Mental illness Father   . Heart disease Father   . Drug abuse Father   . Depression Father   . Alcohol abuse Father   . Arthritis Father   . Stroke Mother   . Diabetes Mother   . Depression Mother   . Arthritis Mother   . Mental illness Mother   . Depression Sister   . Lupus Sister   . Bipolar disorder Paternal Aunt   . Lupus Sister   . Lupus Brother     Social History Social History   Tobacco Use  . Smoking status: Former Smoker    Packs/day: 2.00    Years: 35.00    Pack years: 70.00    Types: Cigarettes  . Smokeless tobacco: Never Used  . Tobacco comment: Vapor  Substance Use Topics  . Alcohol use: Yes    Alcohol/week: 0.0 standard drinks    Comment: a few beers a month  . Drug use: Not Currently    Comment: prior h/o MJ use     Allergies   Kava kava and Other   Review of Systems Review of Systems No sore throat No cough No pleuritic pain No wheezing No nasal congestion No post-nasal drainage No sinus pain/pressure No itchy/red eyes + right earache No hemoptysis No SOB No fever/chills No nausea No vomiting No abdominal pain No diarrhea No urinary symptoms No skin rash No fatigue No myalgias No headache    Physical Exam Triage Vital Signs ED Triage Vitals  Enc Vitals Group     BP 03/27/18 1340 (!) 164/103     Pulse Rate 03/27/18 1340 86     Resp --      Temp 03/27/18 1340 98.5 F (36.9 C)      Temp Source 03/27/18 1340 Oral     SpO2 03/27/18 1340 99 %     Weight 03/27/18 1341 220 lb (99.8 kg)     Height --      Head Circumference --      Peak Flow --      Pain Score 03/27/18 1341 2  Pain Loc --      Pain Edu? --      Excl. in GC? --    No data found.  Updated Vital Signs BP (!) 164/103 (BP Location: Right Arm)   Pulse 86   Temp 98.5 F (36.9 C) (Oral)   Wt 99.8 kg   SpO2 99%   BMI 26.78 kg/m   Visual Acuity Right Eye Distance:   Left Eye Distance:   Bilateral Distance:    Right Eye Near:   Left Eye Near:    Bilateral Near:     Physical Exam Nursing notes and Vital Signs reviewed. Appearance:  Patient appears stated age, and in no acute distress Eyes:  Pupils are equal, round, and reactive to light and accomodation.  Extraocular movement is intact.  Conjunctivae are not inflamed  Ears:  Right canal erythematous with mucopurlent discharge present.  Right tympanic membrane is erythematous.  Left canal appears normal; left tympanic membrane probably normal.  Nose:  Mildly congested turbinates.  No sinus tenderness.   Pharynx:  Normal Neck:  Supple.  No adenpathy Lungs:  Clear to auscultation.  Breath sounds are equal.  Moving air well. Heart:  Regular rate and rhythm without murmurs, rubs, or gallops.  Abdomen:  Nontender without masses or hepatosplenomegaly.  Bowel sounds are present.  No CVA or flank tenderness.  Extremities:  No edema.  Skin:  No rash present.    UC Treatments / Results  Labs (all labs ordered are listed, but only abnormal results are displayed) Labs Reviewed -   Tympanometry:  Right ear tympanogram wide, and high peak height; Left ear tympanogram normal  EKG None  Radiology No results found.  Procedures Procedures (including critical care time)  Medications Ordered in UC Medications - No data to display  Initial Impression / Assessment and Plan / UC Course  I have reviewed the triage vital signs and the nursing  notes.  Pertinent labs & imaging results that were available during my care of the patient were reviewed by me and considered in my medical decision making (see chart for details).    Begin clindamycin, and Cortisporin otic suspension. Followup with ENT if not improving.   Final Clinical Impressions(s) / UC Diagnoses   Final diagnoses:  Acute right otitis media  Acute diffuse otitis externa of right ear     Discharge Instructions     Discontinue doxycycline for now.    ED Prescriptions    Medication Sig Dispense Auth. Provider   clindamycin (CLEOCIN) 300 MG capsule Take one cap by mouth every 8 hours 30 capsule Lattie Haw, MD   neomycin-polymyxin-hydrocortisone (CORTISPORIN) 3.5-10000-1 OTIC suspension Place 4 drops into the right ear 3 (three) times daily. 7.5 mL Lattie Haw, MD         Lattie Haw, MD 03/27/18 [phone]

## 2018-03-27 NOTE — ED Triage Notes (Signed)
Pt c/o right ear pain x2 days. He is on doxy for skin infection.

## 2018-03-27 NOTE — Discharge Instructions (Addendum)
Discontinue doxycycline for now.

## 2018-03-28 ENCOUNTER — Encounter: Payer: Self-pay | Admitting: Family Medicine

## 2018-03-29 ENCOUNTER — Emergency Department (INDEPENDENT_AMBULATORY_CARE_PROVIDER_SITE_OTHER)
Admission: EM | Admit: 2018-03-29 | Discharge: 2018-03-29 | Disposition: A | Discharge status: Home / Self Care | Payer: PPO | Source: Home / Self Care | Attending: Family Medicine | Admitting: Family Medicine

## 2018-03-29 ENCOUNTER — Other Ambulatory Visit: Payer: Self-pay

## 2018-03-29 ENCOUNTER — Encounter (HOSPITAL_COMMUNITY): Payer: Self-pay | Admitting: *Deleted

## 2018-03-29 DIAGNOSIS — Z5321 Procedure and treatment not carried out due to patient leaving prior to being seen by health care provider: Secondary | ICD-10-CM | POA: Insufficient documentation

## 2018-03-29 DIAGNOSIS — L089 Local infection of the skin and subcutaneous tissue, unspecified: Secondary | ICD-10-CM

## 2018-03-29 DIAGNOSIS — R519 Headache, unspecified: Secondary | ICD-10-CM

## 2018-03-29 DIAGNOSIS — H9201 Otalgia, right ear: Secondary | ICD-10-CM | POA: Insufficient documentation

## 2018-03-29 DIAGNOSIS — R51 Headache: Secondary | ICD-10-CM | POA: Insufficient documentation

## 2018-03-29 NOTE — ED Triage Notes (Signed)
Patient does not feel he is improving and now has headache which is unusual for him. He is now on second or third course of antibiotics for infection lesions on nose and ear.

## 2018-03-29 NOTE — ED Provider Notes (Signed)
[facility]    CSN: [id number] Arrival date & time: 03/29/18  1924     History   Chief Complaint Chief Complaint  Patient presents with  . Headache    HPI Willie Hughes is a 57 y.o. male.   HPI  Willie Hughes is a 57 y.o. male presenting to UC with c/o generalized HA that started this morning and gradually worsening. Pt was seen at [facility] on 03/21/18 and started on doxycycline for severe facial acne.  He developed worsening skin lesions on his face and Right AOM and Right otitis externa. He was dx and start treatment on 03/27/18, switching from doxycycline to clindamycin. Pt is now concerned he has a staph infection in his brain because he "never" gets headaches that Tylenol doesn't help. He took Tylenol about 2 hours ago without relief. Some phonosensitivity but no photosensitivity or nausea. No change in vision. Pt believes he needs additional treatment for his staph infection.    Past Medical History:  Diagnosis Date  . Acne   . Allergic rhinitis   . Allergy   . Bipolar disorder (HCC)   . COPD (chronic obstructive pulmonary disease) (HCC)   . Erectile dysfunction   . Frequent headaches   . Heart disease   . Heart murmur   . Hyperlipidemia   . Hypothyroidism   . Type 2 diabetes mellitus [facility])     Patient Active Problem List   Diagnosis Date Noted  . Pulmonary nodule 03/13/2018  . Abnormal SPEP 02/26/2018  . Uncontrolled type 2 diabetes mellitus with peripheral neuropathy (HCC) 11/20/2017  . History of tobacco abuse 02/01/2017  . Arthritis 12/20/2016  . Medicare annual wellness visit, subsequent 10/11/2016  . Chronic allergic rhinitis 09/20/2016  . Chronic headaches 09/20/2016  . Hypothyroidism 01/28/2016  . Hyperlipidemia 12/15/2015  . Erectile dysfunction 12/15/2015  . Bipolar disorder (HCC) 12/15/2015    Past Surgical History:  Procedure Laterality Date  . WISDOM TOOTH EXTRACTION         Home Medications    Prior to Admission medications     Medication Sig Start Date End Date Taking? Authorizing Provider  albuterol (PROVENTIL HFA;VENTOLIN HFA) 108 (90 Base) MCG/ACT inhaler Inhale 2 puffs into the lungs every 4 (four) hours as needed for wheezing or shortness of breath. 02/02/17   Doreene Nest, NP  aspirin 325 MG tablet Take 325 mg by mouth daily.    [provider]  clindamycin (CLEOCIN) 300 MG capsule Take one cap by mouth every 8 hours 03/27/18   Lattie Haw, MD  doxycycline (VIBRA-TABS) 100 MG tablet Take 1 tablet (100 mg total) by mouth 2 (two) times daily. 03/21/18   Elvina Sidle, MD  Fenofibrate 150 MG CAPS Take 1 capsule (150 mg total) by mouth daily. 03/08/18   Kuneff, Renee A, DO  fexofenadine (ALLEGRA) 180 MG tablet Take 180 mg by mouth daily.     [provider]  glimepiride (AMARYL) 4 MG tablet Take 1 tablet (4 mg total) by mouth daily. 12/25/17   Carlus Pavlov, MD  glucose blood (ONE TOUCH TEST STRIPS) test strip Use as instructed to test blood sugar 3 times daily 04/26/17   Doreene Nest, NP  insulin NPH Human (HUMULIN N,NOVOLIN N) 100 UNIT/ML injection Inject 10 Units into the skin 2 (two) times daily.     [provider]  Insulin Syringe-Needle U-100 (RELION INSULIN SYRINGE 1ML/31G) 31G X 5/16" 1 ML MISC Use with insulin twice daily as directed. 07/11/17  Doreene Nest, NP  levothyroxine (SYNTHROID, LEVOTHROID) 75 MCG tablet TAKE ONE TABLET BY MOUTH EVERY MORNING ON AN EMPTY STOMACH WITH A FULL GLASS OF WATER 12/24/17   Kuneff, Renee A, DO  lithium carbonate 300 MG capsule Take 3 capsules (900 mg total) by mouth daily. 02/28/18   Oletta Darter, MD  metFORMIN (GLUCOPHAGE) 1000 MG tablet Take 1 tablet (1,000 mg total) by mouth 2 (two) times daily with a meal. 01/16/18   Carlus Pavlov, MD  neomycin-polymyxin-hydrocortisone (CORTISPORIN) 3.5-10000-1 OTIC suspension Place 4 drops into the right ear 3 (three) times daily. 03/27/18   Lattie Haw, MD  Omega-3 Fatty Acids (EQL  FISH OIL PO) Take 1,000 mg by mouth 3 (three) times daily.     [provider]  Dola Argyle LANCETS 33G MISC Use as instructed to test blood sugar 3 times daily 06/19/17   Doreene Nest, NP  ranitidine (ZANTAC) 150 MG tablet Take 150 mg by mouth 2 (two) times daily.    [provider]  sildenafil (VIAGRA) 100 MG tablet Take 1 tablet (100 mg total) by mouth daily as needed for erectile dysfunction. 03/08/18   Kuneff, Renee A, DO  simvastatin (ZOCOR) 40 MG tablet Take 1 tablet (40 mg total) by mouth at bedtime. 03/08/18   Kuneff, Renee A, DO  tretinoin (RETIN-A) 0.05 % cream APPLY  CREAM TOPICALLY TO AFFECTED AREA AS NEEDED 12/24/17   Kuneff, Renee A, DO  vitamin C (ASCORBIC ACID) 500 MG tablet Take 500 mg by mouth daily.    [provider]    Family History Family History  Problem Relation Age of Onset  . Heart attack Father   . Stroke Father   . Mental illness Father   . Heart disease Father   . Drug abuse Father   . Depression Father   . Alcohol abuse Father   . Arthritis Father   . Stroke Mother   . Diabetes Mother   . Depression Mother   . Arthritis Mother   . Mental illness Mother   . Depression Sister   . Lupus Sister   . Bipolar disorder Paternal Aunt   . Lupus Sister   . Lupus Brother     Social History Social History   Tobacco Use  . Smoking status: Former Smoker    Packs/day: 2.00    Years: 35.00    Pack years: 70.00    Types: Cigarettes  . Smokeless tobacco: Never Used  . Tobacco comment: Vapor  Substance Use Topics  . Alcohol use: Yes    Alcohol/week: 0.0 standard drinks    Comment: a few beers a month  . Drug use: Not Currently    Comment: prior h/o MJ use     Allergies   Kava kava and Other   Review of Systems Review of Systems  Constitutional: Negative for chills and fever.  HENT: Positive for ear pain (Right).   Gastrointestinal: Negative for nausea and vomiting.  Skin: Positive for color change, rash and  wound.  Neurological: Positive for headaches. Negative for dizziness and light-headedness.     Physical Exam Triage Vital Signs ED Triage Vitals  Enc Vitals Group     BP 03/29/18 1938 (!) 144/81     Pulse Rate 03/29/18 1938 70     Resp 03/29/18 1938 18     Temp 03/29/18 1938 (!) 97.5 F (36.4 C)     Temp Source 03/29/18 1938 Oral     SpO2 03/29/18 1938 98 %  Weight --      Height --      Head Circumference --      Peak Flow --      Pain Score 03/29/18 1939 2     Pain Loc --      Pain Edu? --      Excl. in GC? --    No data found.  Updated Vital Signs BP (!) 144/81 (BP Location: Right Arm)   Pulse 70   Temp (!) 97.5 F (36.4 C) (Oral)   Resp 18   SpO2 98%   Visual Acuity Right Eye Distance:   Left Eye Distance:   Bilateral Distance:    Right Eye Near:   Left Eye Near:    Bilateral Near:     Physical Exam  Constitutional: He is oriented to person, place, and time. He appears well-developed and well-nourished.  Non-toxic appearance. He does not appear ill. No distress.  HENT:  Head: Normocephalic. Head is with abrasion.  Right Ear: There is drainage (erythema and scant white discharge in canal). No swelling or tenderness. Tympanic membrane is erythematous. Tympanic membrane is not bulging.  Left Ear: Tympanic membrane normal.  Nose: Nose normal. Right sinus exhibits no maxillary sinus tenderness and no frontal sinus tenderness. Left sinus exhibits no maxillary sinus tenderness and no frontal sinus tenderness.  Mouth/Throat: Uvula is midline, oropharynx is clear and moist and mucous membranes are normal.  Multiple skin sores with erythema and mild tenderness around nose and ears. No active drainage or bleeding.   Eyes: Pupils are equal, round, and reactive to light. EOM are normal.  Neck: Normal range of motion. Neck supple.  Cardiovascular: Normal rate and regular rhythm.  Pulmonary/Chest: Effort normal and breath sounds normal.  Musculoskeletal: Normal range  of motion.  Neurological: He is alert and oriented to person, place, and time. GCS eye subscore is 4. GCS verbal subscore is 5. GCS motor subscore is 6.  Skin: Skin is warm and dry.  Psychiatric: He has a normal mood and affect. His behavior is normal.  Nursing note and vitals reviewed.    UC Treatments / Results  Labs (all labs ordered are listed, but only abnormal results are displayed) Labs Reviewed - No data to display  EKG None  Radiology No results found.  Procedures Procedures (including critical care time)  Medications Ordered in UC Medications - No data to display  Initial Impression / Assessment and Plan / UC Course  I have reviewed the triage vital signs and the nursing notes.  Pertinent labs & imaging results that were available during my care of the patient were reviewed by me and considered in my medical decision making (see chart for details).     Pt concerned he has a staph infection in his brain given new onset headache unrelieved by Tylenol. Pt is alert to person, place, and time.  Normal gait. Pt cannot take NSAIDs due to being on lithium Advised pt to go to ED for further evaluation and treatment of HA as there is no additional antibiotics that can be prescribed in outpatient setting.   Final Clinical Impressions(s) / UC Diagnoses   Final diagnoses:  Bad headache  Skin infection   Discharge Instructions   None    ED Prescriptions    None     Controlled Substance Prescriptions Lane Controlled Substance Registry consulted? Not Applicable   Rolla Plate 03/29/18 2004

## 2018-03-29 NOTE — ED Triage Notes (Signed)
Pt arrives with c/o headache today (no n/v or change in vision), unrelieved by Tylenol. Pt also has had right ear pain. Pt has recently been eval by his provider for the same, reports has been on abx for infected acne. Denies fevers.

## 2018-03-30 ENCOUNTER — Emergency Department (HOSPITAL_COMMUNITY)
Admission: EM | Admit: 2018-03-30 | Discharge: 2018-03-30 | Disposition: A | Discharge status: Home / Self Care | Payer: PPO | Attending: Emergency Medicine | Admitting: Emergency Medicine

## 2018-03-30 ENCOUNTER — Telehealth: Payer: Self-pay | Admitting: Emergency Medicine

## 2018-03-30 LAB — WOUND CULTURE
MICRO NUMBER: 91056649
SPECIMEN QUALITY: ADEQUATE

## 2018-03-30 NOTE — ED Notes (Signed)
Per registration pt left

## 2018-03-31 ENCOUNTER — Encounter: Payer: Self-pay | Admitting: Family Medicine

## 2018-03-31 ENCOUNTER — Telehealth: Payer: Self-pay | Admitting: Emergency Medicine

## 2018-03-31 ENCOUNTER — Emergency Department (HOSPITAL_BASED_OUTPATIENT_CLINIC_OR_DEPARTMENT_OTHER)
Admission: EM | Admit: 2018-03-31 | Discharge: 2018-03-31 | Disposition: A | Discharge status: Home / Self Care | Payer: PPO | Attending: Emergency Medicine | Admitting: Emergency Medicine

## 2018-03-31 ENCOUNTER — Other Ambulatory Visit: Payer: Self-pay

## 2018-03-31 ENCOUNTER — Encounter (HOSPITAL_BASED_OUTPATIENT_CLINIC_OR_DEPARTMENT_OTHER): Payer: Self-pay | Admitting: *Deleted

## 2018-03-31 DIAGNOSIS — Z87891 Personal history of nicotine dependence: Secondary | ICD-10-CM | POA: Insufficient documentation

## 2018-03-31 DIAGNOSIS — Z79899 Other long term (current) drug therapy: Secondary | ICD-10-CM | POA: Diagnosis not present

## 2018-03-31 DIAGNOSIS — E039 Hypothyroidism, unspecified: Secondary | ICD-10-CM | POA: Diagnosis not present

## 2018-03-31 DIAGNOSIS — Z7982 Long term (current) use of aspirin: Secondary | ICD-10-CM | POA: Insufficient documentation

## 2018-03-31 DIAGNOSIS — H60501 Unspecified acute noninfective otitis externa, right ear: Secondary | ICD-10-CM | POA: Diagnosis not present

## 2018-03-31 DIAGNOSIS — Z794 Long term (current) use of insulin: Secondary | ICD-10-CM | POA: Diagnosis not present

## 2018-03-31 DIAGNOSIS — J449 Chronic obstructive pulmonary disease, unspecified: Secondary | ICD-10-CM | POA: Insufficient documentation

## 2018-03-31 DIAGNOSIS — B965 Pseudomonas (aeruginosa) (mallei) (pseudomallei) as the cause of diseases classified elsewhere: Secondary | ICD-10-CM | POA: Diagnosis present

## 2018-03-31 DIAGNOSIS — E119 Type 2 diabetes mellitus without complications: Secondary | ICD-10-CM | POA: Diagnosis not present

## 2018-03-31 DIAGNOSIS — H60391 Other infective otitis externa, right ear: Secondary | ICD-10-CM | POA: Diagnosis not present

## 2018-03-31 MED ORDER — CIPROFLOXACIN-DEXAMETHASONE 0.3-0.1 % OT SUSP
4.0000 [drp] | Freq: Once | OTIC | Status: AC
  Administered 2018-03-31: 4 [drp] via OTIC
  Filled 2018-03-31: qty 7.5

## 2018-03-31 NOTE — ED Triage Notes (Signed)
Pt reports he was notified of positive would culture that was taken this week at [facility] and was advised to come to the ED for antibiotics

## 2018-03-31 NOTE — ED Provider Notes (Signed)
[facility] EMERGENCY DEPARTMENT Provider Note   CSN: [id number] Arrival date & time: 03/31/18  1605     History   Chief Complaint Chief Complaint  Patient presents with  . Abnormal Lab    positive wound culture    HPI Willie Hughes is a 57 y.o. male with a past medical history of DM 2, bipolar, COPD, frequent headaches, who presents today for evaluation of an abnormal lab.  He has been seen multiple times at [facility] urgent care for acne and right ear infection.   He had a culture taken from inside his ear which, chart review shows, is positive for pseudomonas.  He has been on antibiotic ear drops, doxycycline and clindamycin.  He reports occasional headache.    HPI  Past Medical History:  Diagnosis Date  . Acne   . Allergic rhinitis   . Allergy   . Bipolar disorder (HCC)   . COPD (chronic obstructive pulmonary disease) (HCC)   . Erectile dysfunction   . Frequent headaches   . Heart disease   . Heart murmur   . Hyperlipidemia   . Hypothyroidism   . Type 2 diabetes mellitus [facility])     Patient Active Problem List   Diagnosis Date Noted  . Pulmonary nodule 03/13/2018  . Abnormal SPEP 02/26/2018  . Uncontrolled type 2 diabetes mellitus with peripheral neuropathy (HCC) 11/20/2017  . History of tobacco abuse 02/01/2017  . Arthritis 12/20/2016  . Medicare annual wellness visit, subsequent 10/11/2016  . Chronic allergic rhinitis 09/20/2016  . Chronic headaches 09/20/2016  . Hypothyroidism 01/28/2016  . Hyperlipidemia 12/15/2015  . Erectile dysfunction 12/15/2015  . Bipolar disorder (HCC) 12/15/2015    Past Surgical History:  Procedure Laterality Date  . WISDOM TOOTH EXTRACTION          Home Medications    Prior to Admission medications   Medication Sig Start Date End Date Taking? Authorizing Provider  albuterol (PROVENTIL HFA;VENTOLIN HFA) 108 (90 Base) MCG/ACT inhaler Inhale 2 puffs into the lungs every 4 (four) hours as needed for wheezing or  shortness of breath. 02/02/17   Doreene Nest, NP  aspirin 325 MG tablet Take 325 mg by mouth daily.    [provider]  clindamycin (CLEOCIN) 300 MG capsule Take one cap by mouth every 8 hours 03/27/18   Lattie Haw, MD  doxycycline (VIBRA-TABS) 100 MG tablet Take 1 tablet (100 mg total) by mouth 2 (two) times daily. 03/21/18   Elvina Sidle, MD  Fenofibrate 150 MG CAPS Take 1 capsule (150 mg total) by mouth daily. 03/08/18   Kuneff, Renee A, DO  fexofenadine (ALLEGRA) 180 MG tablet Take 180 mg by mouth daily.     [provider]  glimepiride (AMARYL) 4 MG tablet Take 1 tablet (4 mg total) by mouth daily. 12/25/17   Carlus Pavlov, MD  glucose blood (ONE TOUCH TEST STRIPS) test strip Use as instructed to test blood sugar 3 times daily 04/26/17   Doreene Nest, NP  insulin NPH Human (HUMULIN N,NOVOLIN N) 100 UNIT/ML injection Inject 10 Units into the skin 2 (two) times daily.     [provider]  Insulin Syringe-Needle U-100 (RELION INSULIN SYRINGE 1ML/31G) 31G X 5/16" 1 ML MISC Use with insulin twice daily as directed. 07/11/17   Doreene Nest, NP  levothyroxine (SYNTHROID, LEVOTHROID) 75 MCG tablet TAKE ONE TABLET BY MOUTH EVERY MORNING ON AN EMPTY STOMACH WITH A FULL GLASS OF WATER 12/24/17   Kuneff, Renee A, DO  lithium carbonate 300 MG capsule Take 3 capsules (900 mg total) by mouth daily. 02/28/18   Oletta Darter, MD  metFORMIN (GLUCOPHAGE) 1000 MG tablet Take 1 tablet (1,000 mg total) by mouth 2 (two) times daily with a meal. 01/16/18   Carlus Pavlov, MD  neomycin-polymyxin-hydrocortisone (CORTISPORIN) 3.5-10000-1 OTIC suspension Place 4 drops into the right ear 3 (three) times daily. 03/27/18   Lattie Haw, MD  Omega-3 Fatty Acids (EQL FISH OIL PO) Take 1,000 mg by mouth 3 (three) times daily.     [provider]  Dola Argyle LANCETS 33G MISC Use as instructed to test blood sugar 3 times daily 06/19/17   Doreene Nest, NP    ranitidine (ZANTAC) 150 MG tablet Take 150 mg by mouth 2 (two) times daily.    [provider]  sildenafil (VIAGRA) 100 MG tablet Take 1 tablet (100 mg total) by mouth daily as needed for erectile dysfunction. 03/08/18   Kuneff, Renee A, DO  simvastatin (ZOCOR) 40 MG tablet Take 1 tablet (40 mg total) by mouth at bedtime. 03/08/18   Kuneff, Renee A, DO  tretinoin (RETIN-A) 0.05 % cream APPLY  CREAM TOPICALLY TO AFFECTED AREA AS NEEDED 12/24/17   Kuneff, Renee A, DO  vitamin C (ASCORBIC ACID) 500 MG tablet Take 500 mg by mouth daily.    [provider]    Family History Family History  Problem Relation Age of Onset  . Heart attack Father   . Stroke Father   . Mental illness Father   . Heart disease Father   . Drug abuse Father   . Depression Father   . Alcohol abuse Father   . Arthritis Father   . Stroke Mother   . Diabetes Mother   . Depression Mother   . Arthritis Mother   . Mental illness Mother   . Depression Sister   . Lupus Sister   . Bipolar disorder Paternal Aunt   . Lupus Sister   . Lupus Brother     Social History Social History   Tobacco Use  . Smoking status: Former Smoker    Packs/day: 2.00    Years: 35.00    Pack years: 70.00    Types: Cigarettes  . Smokeless tobacco: Never Used  . Tobacco comment: Vapor  Substance Use Topics  . Alcohol use: Yes    Alcohol/week: 0.0 standard drinks    Comment: a few beers a month  . Drug use: Not Currently    Comment: prior h/o MJ use     Allergies   Kava kava and Other   Review of Systems Review of Systems  Constitutional: Negative for chills and fever.  HENT: Positive for ear discharge. Negative for ear pain.   Neurological: Positive for headaches (Occasionally, not right now. ).     Physical Exam Updated Vital Signs BP (!) 148/83 (BP Location: Left Arm)   Pulse 87   Temp 99.2 F (37.3 C) (Oral)   Resp 18   Ht 6\' 4"  (1.93 m)   Wt 100.2 kg   SpO2 98%   BMI 26.90 kg/m   Physical  Exam  Constitutional: He is oriented to person, place, and time. He appears well-developed. No distress.  HENT:  Head: Normocephalic and atraumatic.  Left Ear: Tympanic membrane, external ear and ear canal normal.  Mouth/Throat: Oropharynx is clear and moist.  There is slight erythema on the right tragus without pain with external ear movement.  There is mild erythema to the right  ear canal and without swelling, or drainage/discharge.  No fluid behind right TM.  No lesions in right ear canal or on the TM.  Facial swelling.  No obvious abscess or cellulitis to face.  Eyes: Conjunctivae are normal.  Neck: Normal range of motion. Neck supple.  Neurological: He is alert and oriented to person, place, and time. He has normal strength. He displays no tremor. No cranial nerve deficit or sensory deficit. Coordination and gait normal. GCS eye subscore is 4. GCS verbal subscore is 5. GCS motor subscore is 6.  Skin: He is not diaphoretic.  Nursing note and vitals reviewed.    ED Treatments / Results  Labs (all labs ordered are listed, but only abnormal results are displayed) Labs Reviewed - No data to display  EKG None  Radiology No results found.  Procedures Procedures (including critical care time)  Medications Ordered in ED Medications  ciprofloxacin-dexamethasone (CIPRODEX) 0.3-0.1 % OTIC (EAR) suspension 4 drop (4 drops Right EAR Given 03/31/18 1723)     Initial Impression / Assessment and Plan / ED Course  I have reviewed the triage vital signs and the nursing notes.  Pertinent labs & imaging results that were available during my care of the patient were reviewed by me and considered in my medical decision making (see chart for details).    Patient presents today for concern of positive culture.  He had a superficial culture taken from his right ear canal, after being diagnosed with otitis media and otitis externa, that was positive for Pseudomonas.  Pseudomonas culture is  consistent with otitis externa.  As his lesions and symptoms appear to be improving with p.o. clindamycin, and the multiple black box warning on p.o. Cipro I do not feel like p.o. Cipro is indicated at this time.  We will give him Ciprodex eardrops, to ensure adequate pseudomonal coverage.  Recommended PCP follow-ups.  Regarding his headaches he has a history of frequent headaches, appears neurovascularly intact, recommended Tylenol for his headaches.  His history of improvement with his treatments, along with him being afebrile, not tachycardic, tachypneic, or otherwise septic and generally well-appearing are not consistent with a malignant otitis externa or deeper spread of infection.  Return precautions were discussed with patient who states their understanding.  At the time of discharge patient denied any unaddressed complaints or concerns.  Patient is agreeable for discharge home.  This patient was discussed with Dr. Silverio Lay.   Final Clinical Impressions(s) / ED Diagnoses   Final diagnoses:  Acute otitis externa of right ear, unspecified type    ED Discharge Orders    None       Norman Clay 03/31/18 1805    Charlynne Pander, MD 03/31/18 [phone]

## 2018-03-31 NOTE — Discharge Instructions (Addendum)
Please put 4 drops into the year right ear twice a day for 7 days.  Please schedule a follow-up appointment with your doctor.    Please take Tylenol (acetaminophen) to relieve your pain.  You may take tylenol, up to 1,000 mg (two extra strength pills).  Do not take more than 3,000 mg tylenol in a 24 hour period.  Please check all medication labels as many medications such as pain and cold medications may contain tylenol. Please do not drink alcohol while taking this medication.

## 2018-04-03 ENCOUNTER — Ambulatory Visit (INDEPENDENT_AMBULATORY_CARE_PROVIDER_SITE_OTHER): Payer: PPO | Admitting: Family Medicine

## 2018-04-03 ENCOUNTER — Telehealth: Payer: Self-pay | Admitting: Family Medicine

## 2018-04-03 ENCOUNTER — Ambulatory Visit: Payer: PPO | Admitting: Family Medicine

## 2018-04-03 ENCOUNTER — Encounter: Payer: Self-pay | Admitting: Family Medicine

## 2018-04-03 VITALS — BP 143/88 | HR 108 | Temp 98.4°F | Resp 20 | Ht 76.0 in | Wt 226.0 lb

## 2018-04-03 DIAGNOSIS — H9191 Unspecified hearing loss, right ear: Secondary | ICD-10-CM | POA: Insufficient documentation

## 2018-04-03 DIAGNOSIS — H6020 Malignant otitis externa, unspecified ear: Secondary | ICD-10-CM | POA: Insufficient documentation

## 2018-04-03 DIAGNOSIS — L700 Acne vulgaris: Secondary | ICD-10-CM

## 2018-04-03 MED ORDER — CIPROFLOXACIN-DEXAMETHASONE 0.3-0.1 % OT SUSP: 4.0000 [drp] | Freq: Two times a day (BID) | OTIC | 0 refills | Status: DC

## 2018-04-03 MED ORDER — DOXYCYCLINE HYCLATE 100 MG PO TABS: 100.0000 mg | ORAL_TABLET | Freq: Every day | ORAL | 0 refills | Status: DC

## 2018-04-03 NOTE — Telephone Encounter (Signed)
Spoke with pharmacist he say ofloxacin is generic and is probably cheaper he will submit and let patient know.

## 2018-04-03 NOTE — Progress Notes (Signed)
Willie Hughes , 09/12/1960, 57 y.o., male MRN: [id number] Patient Care Team    Relationship Specialty Notifications Start End  Natalia Leatherwood, DO PCP - General Family Medicine  12/19/17   Carlus Pavlov, MD Consulting Physician Endocrinology  12/24/17   Pollyann Savoy, MD Consulting Physician Rheumatology  02/26/18     Chief Complaint  Patient presents with  . Follow-up    ER visit for otitis media     Subjective: Pt presents for an OV for follow up on his ear infections. He was seen in the ED for this condition. The ears were cultured and resulted with pseudomonas infection. He was prescribed clindamycin and ciprodex drops, which he has been using In his right ear. He feels his symptoms are improving, but still present. He does endorse decreased hearing and a sense of fullness in his right ear since the infection that is not improving. He denies fever, chills, ear pain, nausea.  He was also seen for a skin infection  Of his face. He has struggled with chronic acne and in the past has been able to keep it partially controlled with retin-A use. However over the last few months his acne has been more prominent. He has been referred to dermatology, but has yet to schedule. He feels the doxy prescribed in the ED did help with skin infection and calmed down his acne.   Depression screen [facility] 2/9 03/08/2018 12/19/2017  Decreased Interest 0 0  Down, Depressed, Hopeless 0 0  PHQ - 2 Score 0 0    Allergies  Allergen Reactions  . Kava Kava     Rash   . Other Rash    Some antiperspirants cause a rash   Social History   Tobacco Use  . Smoking status: Former Smoker    Packs/day: 2.00    Years: 35.00    Pack years: 70.00    Types: Cigarettes  . Smokeless tobacco: Never Used  . Tobacco comment: Vapor  Substance Use Topics  . Alcohol use: Yes    Alcohol/week: 0.0 standard drinks    Comment: a few beers a month   Past Medical History:  Diagnosis Date  . Acne   . Allergic rhinitis   .  Allergy   . Bipolar disorder (HCC)   . COPD (chronic obstructive pulmonary disease) (HCC)   . Erectile dysfunction   . Frequent headaches   . Heart disease   . Heart murmur   . Hyperlipidemia   . Hypothyroidism   . Type 2 diabetes mellitus (HCC)    Past Surgical History:  Procedure Laterality Date  . WISDOM TOOTH EXTRACTION     Family History  Problem Relation Age of Onset  . Heart attack Father   . Stroke Father   . Mental illness Father   . Heart disease Father   . Drug abuse Father   . Depression Father   . Alcohol abuse Father   . Arthritis Father   . Stroke Mother   . Diabetes Mother   . Depression Mother   . Arthritis Mother   . Mental illness Mother   . Depression Sister   . Lupus Sister   . Bipolar disorder Paternal Aunt   . Lupus Sister   . Lupus Brother    Allergies as of 04/03/2018      Reactions   Kava Kava    Rash    Other Rash   Some antiperspirants cause a rash      Medication List  Accurate as of 04/03/18 11:14 AM. Always use your most recent med list.          albuterol 108 (90 Base) MCG/ACT inhaler Commonly known as:  PROVENTIL HFA;VENTOLIN HFA Inhale 2 puffs into the lungs every 4 (four) hours as needed for wheezing or shortness of breath.   aspirin 325 MG tablet Take 325 mg by mouth daily.   clindamycin 300 MG capsule Commonly known as:  CLEOCIN Take one cap by mouth every 8 hours   EQL FISH OIL PO Take 1,000 mg by mouth 3 (three) times daily.   Fenofibrate 150 MG Caps Take 1 capsule (150 mg total) by mouth daily.   fexofenadine 180 MG tablet Commonly known as:  ALLEGRA Take 180 mg by mouth daily.   glimepiride 4 MG tablet Commonly known as:  AMARYL Take 1 tablet (4 mg total) by mouth daily.   glucose blood test strip Use as instructed to test blood sugar 3 times daily   insulin NPH Human 100 UNIT/ML injection Commonly known as:  HUMULIN N,NOVOLIN N Inject 10 Units into the skin 2 (two) times daily.   Insulin  Syringe-Needle U-100 31G X 5/16" 1 ML Misc Use with insulin twice daily as directed.   levothyroxine 75 MCG tablet Commonly known as:  SYNTHROID, LEVOTHROID TAKE ONE TABLET BY MOUTH EVERY MORNING ON AN EMPTY STOMACH WITH A FULL GLASS OF WATER   lithium carbonate 300 MG capsule Take 3 capsules (900 mg total) by mouth daily.   metFORMIN 1000 MG tablet Commonly known as:  GLUCOPHAGE Take 1 tablet (1,000 mg total) by mouth 2 (two) times daily with a meal.   neomycin-polymyxin-hydrocortisone 3.5-10000-1 OTIC suspension Commonly known as:  CORTISPORIN Place 4 drops into the right ear 3 (three) times daily.   ONETOUCH DELICA LANCETS 33G Misc Use as instructed to test blood sugar 3 times daily   ranitidine 150 MG tablet Commonly known as:  ZANTAC Take 150 mg by mouth 2 (two) times daily.   sildenafil 100 MG tablet Commonly known as:  VIAGRA Take 1 tablet (100 mg total) by mouth daily as needed for erectile dysfunction.   simvastatin 40 MG tablet Commonly known as:  ZOCOR Take 1 tablet (40 mg total) by mouth at bedtime.   tretinoin 0.05 % cream Commonly known as:  RETIN-A APPLY  CREAM TOPICALLY TO AFFECTED AREA AS NEEDED   vitamin C 500 MG tablet Commonly known as:  ASCORBIC ACID Take 500 mg by mouth daily.       All past medical history, surgical history, allergies, family history, immunizations andmedications were updated in the EMR today and reviewed under the history and medication portions of their EMR.     ROS: Negative, with the exception of above mentioned in HPI   Objective:  BP (!) 143/88 (BP Location: Left Arm, Patient Position: Sitting, Cuff Size: Large)   Pulse (!) 108   Temp 98.4 F (36.9 C)   Resp 20   Ht 6\' 4"  (1.93 m)   Wt 226 lb (102.5 kg)   SpO2 96%   BMI 27.51 kg/m  Body mass index is 27.51 kg/m. Gen: Afebrile. No acute distress. Nontoxic in appearance, well developed, well nourished.  HENT: AT. [place]. Bilateral TM visualized without erythema,  lesions or fullness, no perforations. Right tragus with erythema. EAC with mild erythema and exudate bilateral EAC.  MMM, no oral lesions.  Eyes:Pupils Equal Round Reactive to light, Extraocular movements intact,  Conjunctiva without redness, discharge or icterus. Neck/lymp/endocrine: [employer]  lymphadenopathy Skin: no rashes, purpura or petechiae. No facial swelling. Mild acne present forehead, nose with scarring.  Neuro: Normal gait. PERLA. EOMi. Alert. Oriented x3   No exam data present No results found. No results found for this or any previous visit (from the past 24 hour(s)).  Assessment/Plan: Oval Littrel is a 57 y.o. male present for OV for  Acne vulgaris He has struggled with chronic acne and has scarring. He was able to see benefit with the doxy BID dose when he had infection.  - Doxy 100 QD prescribed to help control acne until able to get into dermatology. They hopefully be able to offer him other options if felt needed.   Otitis externa due to Pseudomonas aeruginosa/Decreased hearing, right I suspect his facial infection that he reported as acne infection, that was around the ear and jaw line was from his pseudomonas ear infection. He has had decent response to clindamycin oral and the cirpo ear drops. His exam today is still with evidence of erythema and exudative drainage in bilateral EAC. He also endorses changes in his right  sided hearing.  We discussed referral to ENT today given original severity of pseudomonas OE and decreased hearing. He may need to have ears cleaned appropriately, to allow absorption of ear drops to properly treat infection, as well as hearing eval.  He is agreeable to this today. - ciprodex otic refilled, use additional 5 days (10 days total).  - I have also strongly encouraged him to see his endo to get better control of his diabetes, which can be causing him to be more susceptible to these types of infections and also be causing his overall fatigue. He is  agreeable.  - Ambulatory referral to ENT   Reviewed expectations re: course of current medical issues.  Discussed self-management of symptoms.  Outlined signs and symptoms indicating need for more acute intervention.  Patient verbalized understanding and all questions were answered.  Patient received an After-Visit Summary.  No orders of the defined types were placed in this encounter.    Note is dictated utilizing voice recognition software. Although note has been proof read prior to signing, occasional typographical errors still can be missed. If any questions arise, please do not hesitate to call for verification.   electronically signed by:  Felix Pacini, DO  [facility] [place]

## 2018-04-03 NOTE — Patient Instructions (Signed)
1. Doxycycline once daily dosing called in for acne. I would still schedule with dermatology so they can help you with your skin condition.   2. I will call in ear drops for you to continue another 5 days in both ears. I will also refer you to an ENT to follow up with, since you have this type of bacteria and some decreased hearing.   3. Make sure to see your endocrinologist soon to get better control of your diabetes. High sugar can cause you to be more susceptible to infections and make you feel not well.     Hyperglycemia Hyperglycemia is when the sugar (glucose) level in your blood is too high. It may not cause symptoms. If you do have symptoms, they may include warning signs, such as:  Feeling more thirsty than normal.  Hunger.  Feeling tired.  Needing to pee (urinate) more than normal.  Blurry eyesight (vision).  You may get other symptoms as it gets worse, such as:  Dry mouth.  Not being hungry (loss of appetite).  Fruity-smelling breath.  Weakness.  Weight gain or loss that is not planned. Weight loss may be fast.  A tingling or numb feeling in your hands or feet.  Headache.  Skin that does not bounce back quickly when it is lightly pinched and released (poor skin turgor).  Pain in your belly (abdomen).  Cuts or bruises that heal slowly.  High blood sugar can happen to people who do or do not have diabetes. High blood sugar can happen slowly or quickly, and it can be an emergency. Follow these instructions at home: General instructions  Take over-the-counter and prescription medicines only as told by your doctor.  Do not use products that contain nicotine or tobacco, such as cigarettes and e-cigarettes. If you need help quitting, ask your doctor.  Limit alcohol intake to no more than 1 drink per day for nonpregnant women and 2 drinks per day for men. One drink equals 12 oz of beer, 5 oz of wine, or 1 oz of hard liquor.  Manage stress. If you need help  with this, ask your doctor.  Keep all follow-up visits as told by your doctor. This is important. Eating and drinking  Stay at a healthy weight.  Exercise regularly, as told by your doctor.  Drink enough fluid, especially when you: ? Exercise. ? Get sick. ? Are in hot temperatures.  Eat healthy foods, such as: ? Low-fat (lean) proteins. ? Complex carbs (complex carbohydrates), such as whole wheat bread or brown rice. ? Fresh fruits and vegetables. ? Low-fat dairy products. ? Healthy fats.  Drink enough fluid to keep your pee (urine) clear or pale yellow. If you have diabetes:  Make sure you know the symptoms of hyperglycemia.  Follow your diabetes management plan, as told by your doctor. Make sure you: ? Take insulin and medicines as told. ? Follow your exercise plan. ? Follow your meal plan. Eat on time. Do not skip meals. ? Check your blood sugar as often as told. Make sure to check before and after exercise. If you exercise longer or in a different way than you normally do, check your blood sugar more often. ? Follow your sick day plan whenever you cannot eat or drink normally. Make this plan ahead of time with your doctor.  Share your diabetes management plan with people in your workplace, school, and household.  Check your urine for ketones when you are ill and as told by your doctor.  Carry a card or wear jewelry that says that you have diabetes. Contact a doctor if:  Your blood sugar level is higher than 240 mg/dL (40.9 mmol/L) for 2 days in a row.  You have problems keeping your blood sugar in your target range.  High blood sugar happens often for you. Get help right away if:  You have trouble breathing.  You have a change in how you think, feel, or act (mental status).  You feel sick to your stomach (nauseous), and that feeling does not go away.  You cannot stop throwing up (vomiting). These symptoms may be an emergency. Do not wait to see if the  symptoms will go away. Get medical help right away. Call your local emergency services (911 in the [place] Do not drive yourself to the hospital. Summary  Hyperglycemia is when the sugar (glucose) level in your blood is too high.  High blood sugar can happen to people who do or do not have diabetes.  Make sure you drink enough fluids, eat healthy foods, and exercise regularly.  Contact your doctor if you have problems keeping your blood sugar in your target range. This information is not intended to replace advice given to you by your health care provider. Make sure you discuss any questions you have with your health care provider. Document Released: 05/07/2009 Document Revised: 03/27/2016 Document Reviewed: 03/27/2016 Elsevier Interactive Patient Education  2017 [employer].

## 2018-04-03 NOTE — Telephone Encounter (Signed)
Copied from CRM [id number]. Topic: Quick Communication - Rx Refill/Question >> Apr 03, 2018 11:54 AM Raquel Sarna wrote: ciprofloxacin-dexamethasone (CIPRODEX) OTIC suspension  cost $90. Out of pocket for pt.  Asking Dr. Claiborne Billings it there is another medication that would cost less out of pocket and is equivalent to the Rx? Please call pharmacy back to have filled for pt asap.

## 2018-04-03 NOTE — Telephone Encounter (Signed)
I am not certain what med is cheapest on his plan. This is the med he said he was taking already (and documented in the ED chart).  Given he has pseudomonas infection our options are limited for appropriate coverage.  Ofloxacin OTIC ciprofloxacin -hydrocortisone OTIC Are other options. Please call pharmacy and see which is financially more suitable please

## 2018-04-05 MED ORDER — OFLOXACIN 0.3 % OT SOLN: 5.0000 [drp] | Freq: Every day | OTIC | 0 refills | Status: AC

## 2018-04-05 NOTE — Addendum Note (Signed)
Addended by: Thomasena EdisWILLIAMS, Budd Freiermuth N on: 04/05/2018 10:04 AM   Modules accepted: Orders

## 2018-04-09 ENCOUNTER — Ambulatory Visit: Payer: Self-pay | Admitting: Internal Medicine

## 2018-04-09 DIAGNOSIS — Z0289 Encounter for other administrative examinations: Secondary | ICD-10-CM

## 2018-04-09 NOTE — Progress Notes (Deleted)
Patient ID: Lelon MastLarry Topping, male   DOB: 01/21/1961, 57 y.o.   MRN: [id number]   HPI: Lelon MastLarry Barwick is a 57 y.o.-year-old male, returning for follow-up for DM2, dx in 1999 - after Clozapine tx, insulin-dependent since 2018, uncontrolled, with complications (PN, ED).  Last visit 4 mo ago.  Last hemoglobin A1c was: Lab Results  Component Value Date   HGBA1C 10.3 (A) 12/18/2017   HGBA1C 9.7 (H) 09/26/2017   HGBA1C 10.3 (H) 04/23/2017   Pt is on a regimen of: - Metformin 1000 mg 2x a day with meals - Glimepiride 4 mg before lunch - NPH 20 units in am and 15 units at bedtime  At last visit, he was not checking sugars.  We could not change his regimen but I advised him to eliminate OJ and donuts. He stopped some of them.  He is now checking his sugars 0-1x a day - per meter download: - am: n/xc >> 121-241, 311 - 2h after b'fast: 245 - before lunch: n/c >> 230-393 - 2h after lunch: n/c >> 204-322 - before dinner: n/c >> 230-251, 342 - 2h after dinner: n/c - bedtime: n/c - nighttime: n/c  Lowest: 120 Highest: 400s  Glucometer:  One Touch ultra  Pt's meals are: - Breakfast: OJ, yoghurt, 2 donuts, canned or fresh fruit - Lunch: 2-3 hot dogs + chips; 1 can of chilli; sandwich + chips - Dinner: meat + starch + salad/veggies - Snacks: diet ice tea, diet soda, cider  -no CKD, last BUN/creatinine:  Lab Results  Component Value Date   BUN 9 12/19/2017   BUN 9 11/22/2017   CREATININE 0.86 12/19/2017   CREATININE 0.79 11/22/2017   -+ HL; last set of lipids: Lab Results  Component Value Date   CHOL 123 03/08/2018   HDL 29 (L) 03/08/2018   LDLCALC 66 03/08/2018   LDLDIRECT 73.0 09/26/2017   TRIG 228 (H) 03/08/2018   CHOLHDL 4.2 03/08/2018  On simvastatin 40, fenofibrate 150.  - last eye exam was on 10/2016: No DR  -+ Numbness and tingling in his feet.  Last foot exam by PCP was in 09/28/2017  Pt has FH of DM in mother.   She also has a history of hypothyroidism due to lithium  treatment, on LT4 75 mcg daily.  His thyroid test have been normal lately: Lab Results  Component Value Date   TSH 2.840 11/22/2017   TSH 3.01 11/01/2017   TSH 5.49 (H) 09/26/2017   TSH 5.020 (H) 04/18/2017   TSH 5.08 (H) 03/09/2017   Psychiatrist: Dr. Michae KavaAgarwal  ROS: Constitutional: no weight gain/no weight loss, no fatigue, no subjective hyperthermia, no subjective hypothermia Eyes: no blurry vision, no xerophthalmia ENT: no sore throat, no nodules palpated in throat, no dysphagia, no odynophagia, no hoarseness Cardiovascular: no CP/no SOB/no palpitations/no leg swelling Respiratory: no cough/no SOB/no wheezing Gastrointestinal: no N/no V/no D/no C/no acid reflux Musculoskeletal: no muscle aches/no joint aches Skin: no rashes, no hair loss Neurological: + tremors/+ numbness/+ tingling/no dizziness  I reviewed pt's medications, allergies, PMH, social hx, family hx, and changes were documented in the history of present illness. Otherwise, unchanged from my initial visit note.  Past Medical History:  Diagnosis Date  . Acne   . Allergic rhinitis   . Allergy   . Bipolar disorder (HCC)    managed by Psych- Dr. Michae KavaAgarwal  . COPD (chronic obstructive pulmonary disease) (HCC)   . Erectile dysfunction   . Frequent headaches   . Heart disease   .  Heart murmur   . Hyperlipidemia   . Hypothyroidism   . Pulmonary nodule 02/2018   Found on CT screen, Follow up due 02/2019  . Type 2 diabetes mellitus (HCC)    managed by Dr. Elvera Lennox.   Past Surgical History:  Procedure Laterality Date  . WISDOM TOOTH EXTRACTION     Social History   Socioeconomic History  . Marital status: Single    Spouse name: Not on file  . Number of children: 0  . Years of education: Not on file  . Highest education level: Not on file  Occupational History  . Not on file  Social Needs  . Financial resource strain: Not on file  . Food insecurity:    Worry: Not on file    Inability: Not on file  .  Transportation needs:    Medical: Not on file    Non-medical: Not on file  Tobacco Use  . Smoking status: Former Smoker    Packs/day: 2.00    Years: 35.00    Pack years: 70.00    Types: Cigarettes  . Smokeless tobacco: Never Used  . Tobacco comment: Vapor  Substance and Sexual Activity  . Alcohol use: Yes    Alcohol/week: 0.0 standard drinks    Comment: a few beers a month  . Drug use: Not Currently    Comment: prior h/o MJ use  . Sexual activity: Not Currently  Lifestyle  . Physical activity:    Days per week: Not on file    Minutes per session: Not on file  . Stress: Not on file  Relationships  . Social connections:    Talks on phone: Not on file    Gets together: Not on file    Attends religious service: Not on file    Active member of club or organization: Not on file    Attends meetings of clubs or organizations: Not on file    Relationship status: Not on file  . Intimate partner violence:    Fear of current or ex partner: Not on file    Emotionally abused: Not on file    Physically abused: Not on file    Forced sexual activity: Not on file  Other Topics Concern  . Not on file  Social History Narrative   Single and has one cat. Living in [place]. Never married. No kids.    Born and raised in [place] by parents. 2 sisters and 1 brother and pt is the youngest. Pt has 2 associates degrees in Sports administrator   Disabled for Bipolar disorder.  Last worked 2008    Enjoys watching TV, playing on the Internet.   Current Outpatient Medications on File Prior to Visit  Medication Sig Dispense Refill  . albuterol (PROVENTIL HFA;VENTOLIN HFA) 108 (90 Base) MCG/ACT inhaler Inhale 2 puffs into the lungs every 4 (four) hours as needed for wheezing or shortness of breath. 1 Inhaler 0  . aspirin 325 MG tablet Take 325 mg by mouth daily.    . clindamycin (CLEOCIN) 300 MG capsule Take one cap by mouth every 8 hours 30 capsule 0  . doxycycline (VIBRA-TABS) 100 MG tablet  Take 1 tablet (100 mg total) by mouth daily. 90 tablet 0  . Fenofibrate 150 MG CAPS Take 1 capsule (150 mg total) by mouth daily. 90 each 3  . fexofenadine (ALLEGRA) 180 MG tablet Take 180 mg by mouth daily.     Marland Kitchen glimepiride (AMARYL) 4 MG tablet Take 1 tablet (4 mg  total) by mouth daily. 90 tablet 3  . glucose blood (ONE TOUCH TEST STRIPS) test strip Use as instructed to test blood sugar 3 times daily 300 each 2  . insulin NPH Human (HUMULIN N,NOVOLIN N) 100 UNIT/ML injection Inject 10 Units into the skin 2 (two) times daily.     . Insulin Syringe-Needle U-100 (RELION INSULIN SYRINGE 1ML/31G) 31G X 5/16" 1 ML MISC Use with insulin twice daily as directed. 100 each 1  . levothyroxine (SYNTHROID, LEVOTHROID) 75 MCG tablet TAKE ONE TABLET BY MOUTH EVERY MORNING ON AN EMPTY STOMACH WITH A FULL GLASS OF WATER 90 tablet 3  . lithium carbonate 300 MG capsule Take 3 capsules (900 mg total) by mouth daily. 90 capsule 2  . metFORMIN (GLUCOPHAGE) 1000 MG tablet Take 1 tablet (1,000 mg total) by mouth 2 (two) times daily with a meal. 180 tablet 0  . ofloxacin (FLOXIN) 0.3 % OTIC solution Place 5 drops into both ears daily for 5 days. 1.3 mL 0  . Omega-3 Fatty Acids (EQL FISH OIL PO) Take 1,000 mg by mouth 3 (three) times daily.     Letta Pate DELICA LANCETS 33G MISC Use as instructed to test blood sugar 3 times daily 300 each 1  . ranitidine (ZANTAC) 150 MG tablet Take 150 mg by mouth 2 (two) times daily.    . sildenafil (VIAGRA) 100 MG tablet Take 1 tablet (100 mg total) by mouth daily as needed for erectile dysfunction. 16 tablet 5  . simvastatin (ZOCOR) 40 MG tablet Take 1 tablet (40 mg total) by mouth at bedtime. 90 tablet 3  . tretinoin (RETIN-A) 0.05 % cream APPLY  CREAM TOPICALLY TO AFFECTED AREA AS NEEDED 45 g 5  . vitamin C (ASCORBIC ACID) 500 MG tablet Take 500 mg by mouth daily.     No current facility-administered medications on file prior to visit.    Allergies  Allergen Reactions  . Kava  Kava     Rash   . Other Rash    Some antiperspirants cause a rash   Family History  Problem Relation Age of Onset  . Heart attack Father   . Stroke Father   . Mental illness Father   . Heart disease Father   . Drug abuse Father   . Depression Father   . Alcohol abuse Father   . Arthritis Father   . Stroke Mother   . Diabetes Mother   . Depression Mother   . Arthritis Mother   . Mental illness Mother   . Depression Sister   . Lupus Sister   . Bipolar disorder Paternal Aunt   . Lupus Sister   . Lupus Brother     PE: There were no vitals taken for this visit. Wt Readings from Last 3 Encounters:  04/03/18 226 lb (102.5 kg)  03/31/18 221 lb (100.2 kg)  03/27/18 220 lb (99.8 kg)   Constitutional: overweight, in NAD Eyes: PERRLA, EOMI, no exophthalmos ENT: moist mucous membranes, no thyromegaly, no cervical lymphadenopathy Cardiovascular: RRR, No MRG Respiratory: CTA B Gastrointestinal: abdomen soft, NT, ND, BS+ Musculoskeletal: no deformities, strength intact in all 4 Skin: moist, warm, no rashes Neurological: + tremor with outstretched hands, DTR normal in all 4  ASSESSMENT: 1. DM2, insulin-dependent, uncontrolled, with complications - PN - ED  2. HL  3.  Obesity  PLAN:  1. Patient with long-standing, uncontrolled, type 2 diabetes, on oral antidiabetic regimen and intermediate acting insulin.  At last visit, we increased his NPH in  the morning but we did not change the regimen otherwise.  I advised him to try to take Amaryl before lunch (he usually wakes up to eat breakfast and then goes to bed for 5 hours so he prefers to take the medication with lunch rather than breakfast).  We discussed at length about stopping concentrating sweets and how to replace these with other foods. -Cost of the medicines is a major hurdle for him.  - I suggested to:  Patient Instructions  Please continue: - Metformin 1000 mg 2x a day with meals - Glimepiride 4 mg before lunch -  NPH 20 units in am and 15 units at bedtime  Please return in 3 months with your sugar log.   - today, HbA1c is 7%  - continue checking sugars at different times of the day - check 2-3x a day, rotating checks - advised for yearly eye exams >> he is not UTD - Return to clinic in 3 mo with sugar log   2. HL - Reviewed latest lipid panel: LDL better, at goal, triglycerides high, HDL low Lab Results  Component Value Date   CHOL 123 03/08/2018   HDL 29 (L) 03/08/2018   LDLCALC 66 03/08/2018   LDLDIRECT 73.0 09/26/2017   TRIG 228 (H) 03/08/2018   CHOLHDL 4.2 03/08/2018  - Continues simvastatin and fenofibrate without side effects.  3.  Obesity -I would love to be able to use a GLP-1 receptor agonist for him >> I advised him to check with his insurance whether this is covered.  This should help with both diabetes and and weight loss.  Carlus Pavlov, MD PhD [facility] Endocrinology

## 2018-04-15 ENCOUNTER — Other Ambulatory Visit: Payer: Self-pay

## 2018-04-15 NOTE — Patient Outreach (Signed)
[facility]) Care Management  04/15/2018  Willie MastLarry Hughes 05/11/1961 [id number]  TELEPHONE SCREENING Referral date: 04/04/18 Referral source: utilization management referral Referral reason: medication assistance and community resources.  Insurance: [employer] team advantage Attempt #1  Telephone call to patient regarding referral. Unable to reach patient. HIPAA compliant voice message left with call back phone number.   PLAN: RNCM will attempt 2nd telephone call to patient within 4 business days. RNCM will send outreach letter.   [name] RN,BSN, CCM [facility] Telephonic  [phone]

## 2018-04-17 ENCOUNTER — Other Ambulatory Visit: Payer: Self-pay

## 2018-04-17 NOTE — Patient Outreach (Signed)
[facility]) Care Management  04/17/2018  Willie Hughes 1960-10-16 [id number]   TELEPHONE SCREENING Referral date: 04/04/18 Referral source: utilization management referral Referral reason: medication assistance and community resources.  Insurance: Health team advantage Attempt #2   Telephone call to patient regarding referral. Unable to reach patient. HIPAA compliant voice message left with call back phone number.   PLAN: RNCM will attempt 3rd telephone call to patient within 4 business days.   George Ina RN,BSN, CCM [facility] Telephonic  [phone]

## 2018-04-19 ENCOUNTER — Other Ambulatory Visit: Payer: Self-pay | Admitting: Internal Medicine

## 2018-04-19 DIAGNOSIS — E119 Type 2 diabetes mellitus without complications: Secondary | ICD-10-CM

## 2018-04-22 ENCOUNTER — Other Ambulatory Visit: Payer: Self-pay

## 2018-04-22 NOTE — Patient Outreach (Signed)
[facility]) Care Management  04/22/2018  Willie Hughes 12-May-1961 [id number]  TELEPHONE SCREENING Referral date:04/04/18 Referral source:utilization management referral Referral reason:medication assistance and community resources. Insurance:Health team advantage  Telephone call to patient regarding utilization management referral. HIPAA verified.  Explained reason for call.  Patient states he does not need any transportation assistance. Patient states he is not in the donut hole at this time. He states he is no longer on the more expensive insulin but is now on insulin that he is able to afford  which cost $25 a vial. RNCM offered to send patient [facility] Brochure/ magnet.  Patient verbally agreed. Patient states he will probably not sign up for it.   PLAN: RNCM will close patient due to patient refusing services.  RNCM will send patient [facility] Brochure/ magnet  RNCM will send closure notification to patients primary MD.   George Ina RN,BSN,CCM [facility] Telephonic  [phone]

## 2018-04-23 ENCOUNTER — Ambulatory Visit: Payer: Self-pay

## 2018-05-07 ENCOUNTER — Encounter: Payer: Self-pay | Admitting: Family Medicine

## 2018-05-08 ENCOUNTER — Encounter: Payer: Self-pay | Admitting: Internal Medicine

## 2018-05-16 ENCOUNTER — Encounter: Payer: Self-pay | Admitting: Internal Medicine

## 2018-05-17 ENCOUNTER — Encounter: Payer: Self-pay | Admitting: Family Medicine

## 2018-05-17 NOTE — Telephone Encounter (Signed)
I have this patient's PCP because he is no longer under [facility] care and we have not seen him in a long time. Did not understand why he sent this.

## 2018-05-17 NOTE — Telephone Encounter (Signed)
Good morning! I'm really not sure what he's referring to as nothing I prescribed him in the past worked for his diabetes. This is why I sent him to Dr. Elvera Lennox.

## 2018-05-17 NOTE — Telephone Encounter (Signed)
Will route to PCP 

## 2018-06-04 ENCOUNTER — Other Ambulatory Visit: Payer: Self-pay | Admitting: Internal Medicine

## 2018-06-04 DIAGNOSIS — E119 Type 2 diabetes mellitus without complications: Secondary | ICD-10-CM

## 2018-06-04 NOTE — Telephone Encounter (Signed)
Dr Elvera Lennox,  When he was a patient of Vernona Rieger, patient was only on oral medications when we first him. She initiated Lanuts in 05/2016. His medication on switched in 09/2017. Hopefully, this helps.  Sincerely,  Johny Drilling Therapist, sports)

## 2018-06-06 ENCOUNTER — Telehealth: Payer: Self-pay | Admitting: Internal Medicine

## 2018-06-06 ENCOUNTER — Ambulatory Visit (HOSPITAL_COMMUNITY): Payer: Self-pay | Admitting: Psychiatry

## 2018-06-06 NOTE — Telephone Encounter (Signed)
Attempted to call the pt to get him scheduled, no answer no vm pick up will try again

## 2018-06-06 NOTE — Telephone Encounter (Signed)
OK, Thank you

## 2018-06-07 ENCOUNTER — Encounter: Payer: Self-pay | Admitting: Internal Medicine

## 2018-06-07 ENCOUNTER — Telehealth: Payer: Self-pay | Admitting: Internal Medicine

## 2018-06-07 NOTE — Telephone Encounter (Signed)
Patient called in regards to making an appointment. Scheduled patient for first available on February 28th. Patient informed of a medical crisis that has occured. Was wanting to see Dr. Elvera LennoxGherghe sooner do to medications. Please Advise, thanks

## 2018-06-07 NOTE — Telephone Encounter (Signed)
Please see MyChart message patient sent today regarding this.

## 2018-06-13 ENCOUNTER — Ambulatory Visit: Payer: Self-pay | Admitting: Family Medicine

## 2018-06-14 ENCOUNTER — Ambulatory Visit (INDEPENDENT_AMBULATORY_CARE_PROVIDER_SITE_OTHER): Payer: PPO | Admitting: Internal Medicine

## 2018-06-14 ENCOUNTER — Encounter: Payer: Self-pay | Admitting: Internal Medicine

## 2018-06-14 VITALS — BP 162/80 | HR 87 | Ht 76.0 in | Wt 237.0 lb

## 2018-06-14 DIAGNOSIS — E1165 Type 2 diabetes mellitus with hyperglycemia: Secondary | ICD-10-CM

## 2018-06-14 DIAGNOSIS — E663 Overweight: Secondary | ICD-10-CM

## 2018-06-14 DIAGNOSIS — E1142 Type 2 diabetes mellitus with diabetic polyneuropathy: Secondary | ICD-10-CM

## 2018-06-14 DIAGNOSIS — IMO0002 Reserved for concepts with insufficient information to code with codable children: Secondary | ICD-10-CM

## 2018-06-14 DIAGNOSIS — E785 Hyperlipidemia, unspecified: Secondary | ICD-10-CM | POA: Diagnosis not present

## 2018-06-14 LAB — POCT GLYCOSYLATED HEMOGLOBIN (HGB A1C): Hemoglobin A1C: 9.4 % — AB (ref 4.0–5.6)

## 2018-06-14 MED ORDER — METFORMIN HCL 500 MG PO TABS: 500.0000 mg | ORAL_TABLET | Freq: Two times a day (BID) | ORAL | 3 refills | Status: DC

## 2018-06-14 MED ORDER — INSULIN ASPART 100 UNIT/ML FLEXPEN: 8.0000 [IU] | PEN_INJECTOR | Freq: Three times a day (TID) | SUBCUTANEOUS | 11 refills | Status: DC

## 2018-06-14 MED ORDER — INSULIN DEGLUDEC 200 UNIT/ML ~~LOC~~ SOPN: 30.0000 [IU] | PEN_INJECTOR | Freq: Every day | SUBCUTANEOUS | 3 refills | Status: DC

## 2018-06-14 NOTE — Progress Notes (Signed)
Patient ID: Willie Hughes, male   DOB: 1961-02-22, 57 y.o.   MRN: [id number]   HPI: Willie Hughes is a 57 y.o.-year-old male, returning for follow-up for DM2, dx in 1999 - after Clozapine tx, insulin-dependent since 2018, uncontrolled, with complications (PN, ED).  Last visit less 6 months ago.  Sugars are better in last 2 weeks, after cutting down portions. He stopped glimepiride. He feels much better.    He ran out of metformin 05/2018 and started to feel better afterwards.  Last hemoglobin A1c was: Lab Results  Component Value Date   HGBA1C 10.3 (A) 12/18/2017   HGBA1C 9.7 (H) 09/26/2017   HGBA1C 10.3 (H) 04/23/2017   Pt is on a regimen of:   >> ran out 05/2018 >> stopped - NPH 20 >> 15 units in a.m. and 15 units at bedtime  He is now checking his sugars many - forgot meter: - am: n/xc >> 121-241, 311 >> 150, 181-220 - 2h after b'fast: 245 >> n/c - before lunch: n/c >> 230-393 >> ? - 2h after lunch: n/c >> 204-322 >> n/c - before dinner: n/c >> 230-251, 342 >> ?  - 2h after dinner: n/c - bedtime: n/c - nighttime: n/c  Lowest: 120 >> 150 Highest: 400s >> 320  Glucometer:  One Touch ultra  Pt's meals are: - Breakfast: OJ, yoghurt, 2 donuts, canned or fresh fruit - Lunch: 2-3 hot dogs + chips; 1 can of chilli; sandwich + chips - Dinner: meat + starch + salad/veggies - Snacks: diet ice tea, diet soda, cider I advised him to stop OJ and donuts at the previous visit.  -No CKD, last BUN/creatinine:  Lab Results  Component Value Date   BUN 9 12/19/2017   BUN 9 11/22/2017   CREATININE 0.86 12/19/2017   CREATININE 0.79 11/22/2017   -+ HL; last set of lipids: Lab Results  Component Value Date   CHOL 123 03/08/2018   HDL 29 (L) 03/08/2018   LDLCALC 66 03/08/2018   LDLDIRECT 73.0 09/26/2017   TRIG 228 (H) 03/08/2018   CHOLHDL 4.2 03/08/2018  On simvastatin 40, fenofibrate 150.  - last eye exam was in 10/2016: No DR  - + numbness and tingling in his feet.  Last foot exam  by PCP was in 09/2017  Pt has FH of DM in mother.   She also has a history of hypothyroidism 2/2 Lithium tx, on levothyroxine 75 mcg daily  Review TFTs -controlled: Lab Results  Component Value Date   TSH 2.840 11/22/2017   TSH 3.01 11/01/2017   TSH 5.49 (H) 09/26/2017   TSH 5.020 (H) 04/18/2017   TSH 5.08 (H) 03/09/2017   Psychiatrist: Dr. Michae Kava  Stopped Ca, vit D. Decreased vit C dose.  ROS: Constitutional: no weight gain/no weight loss, no fatigue, no subjective hyperthermia, no subjective hypothermia Eyes: no blurry vision, no xerophthalmia ENT: no sore throat, no nodules palpated in neck, no dysphagia, no odynophagia, no hoarseness Cardiovascular: no CP/no SOB/no palpitations/no leg swelling Respiratory: no cough/no SOB/no wheezing Gastrointestinal: no N/no V/no D/no C/no acid reflux Musculoskeletal: no muscle aches/no joint aches Skin: no rashes, no hair loss Neurological: + tremors/no numbness/no tingling/no dizziness  I reviewed pt's medications, allergies, PMH, social hx, family hx, and changes were documented in the history of present illness. Otherwise, unchanged from my initial visit note.  Past Medical History:  Diagnosis Date  . Acne   . Allergic rhinitis   . Allergy   . Bipolar disorder (HCC)  managed by Psych- Dr. Michae Kava  . COPD (chronic obstructive pulmonary disease) (HCC)   . Erectile dysfunction   . Frequent headaches   . Heart disease   . Heart murmur   . Hyperlipidemia   . Hypothyroidism   . Pulmonary nodule 02/2018   Found on CT screen, Follow up due 02/2019  . Type 2 diabetes mellitus (HCC)    managed by Dr. Elvera Lennox.   Past Surgical History:  Procedure Laterality Date  . WISDOM TOOTH EXTRACTION     Social History   Socioeconomic History  . Marital status: Single    Spouse name: Not on file  . Number of children: 0  . Years of education: Not on file  . Highest education level: Not on file  Occupational History  . Not on file   Social Needs  . Financial resource strain: Not on file  . Food insecurity:    Worry: Not on file    Inability: Not on file  . Transportation needs:    Medical: Not on file    Non-medical: Not on file  Tobacco Use  . Smoking status: Former Smoker    Packs/day: 2.00    Years: 35.00    Pack years: 70.00    Types: Cigarettes  . Smokeless tobacco: Never Used  . Tobacco comment: Vapor  Substance and Sexual Activity  . Alcohol use: Yes    Alcohol/week: 0.0 standard drinks    Comment: a few beers a month  . Drug use: Not Currently    Comment: prior h/o MJ use  . Sexual activity: Not Currently  Lifestyle  . Physical activity:    Days per week: Not on file    Minutes per session: Not on file  . Stress: Not on file  Relationships  . Social connections:    Talks on phone: Not on file    Gets together: Not on file    Attends religious service: Not on file    Active member of club or organization: Not on file    Attends meetings of clubs or organizations: Not on file    Relationship status: Not on file  . Intimate partner violence:    Fear of current or ex partner: Not on file    Emotionally abused: Not on file    Physically abused: Not on file    Forced sexual activity: Not on file  Other Topics Concern  . Not on file  Social History Narrative   Single and has one cat. Living in [place]. Never married. No kids.    Born and raised in [place] by parents. 2 sisters and 1 brother and pt is the youngest. Pt has 2 associates degrees in Sports administrator   Disabled for Bipolar disorder.  Last worked 2008    Enjoys watching TV, playing on the Internet.   Current Outpatient Medications on File Prior to Visit  Medication Sig Dispense Refill  . albuterol (PROVENTIL HFA;VENTOLIN HFA) 108 (90 Base) MCG/ACT inhaler Inhale 2 puffs into the lungs every 4 (four) hours as needed for wheezing or shortness of breath. 1 Inhaler 0  . aspirin 325 MG tablet Take 325 mg by mouth daily.    .  clindamycin (CLEOCIN) 300 MG capsule Take one cap by mouth every 8 hours 30 capsule 0  . doxycycline (VIBRA-TABS) 100 MG tablet Take 1 tablet (100 mg total) by mouth daily. 90 tablet 0  . Fenofibrate 150 MG CAPS Take 1 capsule (150 mg total) by mouth daily.  90 each 3  . fexofenadine (ALLEGRA) 180 MG tablet Take 180 mg by mouth daily.     Marland Kitchen glucose blood (ONE TOUCH TEST STRIPS) test strip Use as instructed to test blood sugar 3 times daily 300 each 2  . insulin NPH Human (HUMULIN N,NOVOLIN N) 100 UNIT/ML injection Inject 10 Units into the skin 2 (two) times daily.     . Insulin Syringe-Needle U-100 (RELION INSULIN SYRINGE 1ML/31G) 31G X 5/16" 1 ML MISC Use with insulin twice daily as directed. 100 each 1  . levothyroxine (SYNTHROID, LEVOTHROID) 75 MCG tablet TAKE ONE TABLET BY MOUTH EVERY MORNING ON AN EMPTY STOMACH WITH A FULL GLASS OF WATER 90 tablet 3  . lithium carbonate 300 MG capsule Take 3 capsules (900 mg total) by mouth daily. 90 capsule 2  . Omega-3 Fatty Acids (EQL FISH OIL PO) Take 1,000 mg by mouth 3 (three) times daily.     Letta Pate DELICA LANCETS 33G MISC Use as instructed to test blood sugar 3 times daily 300 each 1  . ranitidine (ZANTAC) 150 MG tablet Take 150 mg by mouth 2 (two) times daily.    . sildenafil (VIAGRA) 100 MG tablet Take 1 tablet (100 mg total) by mouth daily as needed for erectile dysfunction. 16 tablet 5  . simvastatin (ZOCOR) 40 MG tablet Take 1 tablet (40 mg total) by mouth at bedtime. 90 tablet 3  . tretinoin (RETIN-A) 0.05 % cream APPLY  CREAM TOPICALLY TO AFFECTED AREA AS NEEDED 45 g 5  . vitamin C (ASCORBIC ACID) 500 MG tablet Take 500 mg by mouth daily.    Marland Kitchen glimepiride (AMARYL) 4 MG tablet Take 1 tablet (4 mg total) by mouth daily. (Patient not taking: Reported on 06/14/2018) 90 tablet 3  . metFORMIN (GLUCOPHAGE) 1000 MG tablet TAKE ONE TABLET BY MOUTH TWICE A DAY WITH A MEAL *NEED APPOINTMENT FOR REFILLS (Patient not taking: Reported on 06/14/2018) 60  tablet 0   No current facility-administered medications on file prior to visit.    Allergies  Allergen Reactions  . Kava Kava     Rash   . Other Rash    Some antiperspirants cause a rash   Family History  Problem Relation Age of Onset  . Heart attack Father   . Stroke Father   . Mental illness Father   . Heart disease Father   . Drug abuse Father   . Depression Father   . Alcohol abuse Father   . Arthritis Father   . Stroke Mother   . Diabetes Mother   . Depression Mother   . Arthritis Mother   . Mental illness Mother   . Depression Sister   . Lupus Sister   . Bipolar disorder Paternal Aunt   . Lupus Sister   . Lupus Brother     PE: BP (!) 162/80   Pulse 87   Ht 6\' 4"  (1.93 m) Comment: measured  Wt 237 lb (107.5 kg)   BMI 28.85 kg/m  Wt Readings from Last 3 Encounters:  06/14/18 237 lb (107.5 kg)  04/03/18 226 lb (102.5 kg)  03/31/18 221 lb (100.2 kg)   Constitutional: overweight, in NAD Eyes: PERRLA, EOMI, no exophthalmos ENT: moist mucous membranes, no thyromegaly, no cervical lymphadenopathy Cardiovascular: RRR, No MRG Respiratory: CTA B Gastrointestinal: abdomen soft, NT, ND, BS+ Musculoskeletal: no deformities, strength intact in all 4 Skin: moist, warm, no rashes Neurological: + tremor with outstretched hands, DTR normal in all 4  ASSESSMENT: 1. DM2, insulin-dependent,  uncontrolled, with complications - PN - ED  2. HL  3.  Overweight  PLAN:  1. Patient with longstanding, uncontrolled, type 2 diabetes, on oral antidiabetic regimen and intermediate acting insulin (had to change to this while in the donut hole).  Starting the beginning of the year he will be able to afford analog insulins so he is here for an appointment to discuss about options.  Since last visit, she stopped metformin and glimepiride.  He ran out of metformin 05/2018 and noticed that he was feeling better afterwards.  He also stop glimepiride since his sugars improved. -At this  visit, sugars are slightly better in the morning, but unfortunately, he forgot his log and he cannot remember sugars later in the day.  He does mention that they improved in the last 2 weeks.  We discussed about starting the lower dose of metformin and he agrees to try.  I do not feel he needs glimepiride now, but we did discuss about starting mealtime insulin.  We will try to start NovoLog but may need to revert to regular insulin if insurance does not cover it.  He is also interested in trying a long-acting insulin and I suggested Tresiba U200 instead of NPH.  I gave him an alternative insulin regimen with NPH and regular insulin in case he cannot afford the analog insulins. - I suggested to:  Patient Instructions  Please restart: - Metformin 500 mg 2x a day  Stop NPH.  Please start: - Tresiba 30 units daily  - Novolog 15 min before each meal: 8-12 units  If you cannot get the above insulins, then use the following regimen: - NPH: 15 units 2x a day - Regular insulin (from [employer]) 30 min before each meal: 8-12 units  Please return in 3 months with your sugar log.   - today, HbA1c is 9.4% (improved) - continue checking sugars at different times of the day - check 3x a day, rotating checks - advised for yearly eye exams >> he is due - Return to clinic in 3 mo with sugar log   2. HL - Reviewed latest lipid panel from 02/2018: LDL at goal, triglycerides high, HDL low Lab Results  Component Value Date   CHOL 123 03/08/2018   HDL 29 (L) 03/08/2018   LDLCALC 66 03/08/2018   LDLDIRECT 73.0 09/26/2017   TRIG 228 (H) 03/08/2018   CHOLHDL 4.2 03/08/2018  - Continues simvastatin and fenofibrate without side effects.  3.  Overweight -He gained approximately 10 pounds since last visit -Unfortunately, we need to increase his insulin dose so he may gain more weight.  However, he tells me that he reduce his food portions significantly in the last 2 weeks and he feels great doing so.  I strongly  advised him to continue.  Carlus Pavlovristina Shavonne Ambroise, MD PhD [facility]

## 2018-06-14 NOTE — Patient Instructions (Addendum)
Please restart: - Metformin 500 mg 2x a day  Stop NPH.  Please start: - Tresiba 30 units daily  - Novolog 15 min before each meal: 8-12 units  If you cannot get the above insulins, then use the following regimen: - NPH: 15 units 2x a day - Regular insulin (from [facility]) 30 min before each meal: 8-12 units  Please return in 3 months with your sugar log.

## 2018-06-17 ENCOUNTER — Encounter: Payer: Self-pay | Admitting: Internal Medicine

## 2018-06-17 NOTE — Telephone Encounter (Signed)
Please advise 

## 2018-06-23 ENCOUNTER — Emergency Department (HOSPITAL_BASED_OUTPATIENT_CLINIC_OR_DEPARTMENT_OTHER)
Admission: EM | Admit: 2018-06-23 | Discharge: 2018-06-23 | Disposition: A | Discharge status: Home / Self Care | Payer: PPO | Attending: Emergency Medicine | Admitting: Emergency Medicine

## 2018-06-23 ENCOUNTER — Encounter (HOSPITAL_BASED_OUTPATIENT_CLINIC_OR_DEPARTMENT_OTHER): Payer: Self-pay | Admitting: *Deleted

## 2018-06-23 ENCOUNTER — Other Ambulatory Visit: Payer: Self-pay

## 2018-06-23 DIAGNOSIS — L989 Disorder of the skin and subcutaneous tissue, unspecified: Secondary | ICD-10-CM | POA: Diagnosis not present

## 2018-06-23 DIAGNOSIS — Z87891 Personal history of nicotine dependence: Secondary | ICD-10-CM | POA: Insufficient documentation

## 2018-06-23 DIAGNOSIS — J449 Chronic obstructive pulmonary disease, unspecified: Secondary | ICD-10-CM | POA: Diagnosis not present

## 2018-06-23 DIAGNOSIS — F319 Bipolar disorder, unspecified: Secondary | ICD-10-CM | POA: Insufficient documentation

## 2018-06-23 LAB — CBG MONITORING, ED: Glucose-Capillary: 254 mg/dL — ABNORMAL HIGH (ref 70–99)

## 2018-06-23 MED ORDER — MUPIROCIN CALCIUM 2 % EX CREA: 1.0000 "application " | TOPICAL_CREAM | Freq: Two times a day (BID) | CUTANEOUS | 0 refills | Status: DC

## 2018-06-23 MED ORDER — CLINDAMYCIN HCL 300 MG PO CAPS: 300.0000 mg | ORAL_CAPSULE | Freq: Three times a day (TID) | ORAL | 0 refills | Status: DC

## 2018-06-23 NOTE — ED Triage Notes (Signed)
Pt reports 'out of control staph' infection and 'out of control diabetes'.  Noted to have a spot on his nose.

## 2018-06-23 NOTE — Discharge Instructions (Signed)
Continue taking doxycycline as prescribed.   Use mupirocin cream twice a day on infected lesions. You may take clindamycin as prescribed for the next week to help with infection. Call your primary care doctor regarding refill of doxycycline. Continue taking diabetes medications as prescribed. Follow-up with your primary care doctor if symptoms not improving in the next several days. Return to the emergency room if you develop any new, worsening, concerning symptoms.

## 2018-06-23 NOTE — ED Provider Notes (Signed)
[facility] EMERGENCY DEPARTMENT Provider Note   CSN: [id number] Arrival date & time: 06/23/18  1421     History   Chief Complaint Chief Complaint  Patient presents with  . Wound Infection    HPI Willie Hughes is a 57 y.o. male presenting for evaluation of lesions on his nose.  Patient states he has a history of acne and staph infections which cause lesions to appear in his face.  He reports the lesions on his nose have been there for the past several weeks.  He states they are gradually worsening.  He is on doxycycline 1 pill daily for the past month to help prevent infections, but feels like this is not strong enough.  He has been using AAA ointment on the lesions, states this is helping.  He denies drainage from the areas.  He denies spreading lesions.  He denies fevers, chills, vision changes, cough, chest pain, shortness breath, nausea, vomiting, abdominal pain.  Patient has a history of diabetes, states his blood sugar of 250 is baseline for him.  He reports a mild headache, which he has daily.  This is not new for him, does not feel different than normal.  Additional history obtained from chart review.  Patient seen frequently for acne and headaches.  Recent visit with PCP shows mild improvement of A1c, although some issues with medication cost and side effects.  HPI  Past Medical History:  Diagnosis Date  . Acne   . Allergic rhinitis   . Allergy   . Bipolar disorder (HCC)    managed by Psych- Dr. Michae KavaAgarwal  . COPD (chronic obstructive pulmonary disease) (HCC)   . Erectile dysfunction   . Frequent headaches   . Heart disease   . Heart murmur   . Hyperlipidemia   . Hypothyroidism   . Pulmonary nodule 02/2018   Found on CT screen, Follow up due 02/2019  . Type 2 diabetes mellitus (HCC)    managed by Dr. Elvera LennoxGherghe.    Patient Active Problem List   Diagnosis Date Noted  . Decreased hearing, right 04/03/2018  . Otitis externa due to Pseudomonas aeruginosa  04/03/2018  . Pulmonary nodule 03/13/2018  . Abnormal SPEP 02/26/2018  . Uncontrolled type 2 diabetes mellitus with peripheral neuropathy (HCC) 11/20/2017  . History of tobacco abuse 02/01/2017  . Arthritis 12/20/2016  . Medicare annual wellness visit, subsequent 10/11/2016  . Chronic allergic rhinitis 09/20/2016  . Chronic headaches 09/20/2016  . Hypothyroidism 01/28/2016  . Hyperlipidemia 12/15/2015  . Erectile dysfunction 12/15/2015  . Acne vulgaris 12/15/2015  . Bipolar disorder (HCC) 12/15/2015    Past Surgical History:  Procedure Laterality Date  . WISDOM TOOTH EXTRACTION          Home Medications    Prior to Admission medications   Medication Sig Start Date End Date Taking? Authorizing Provider  albuterol (PROVENTIL HFA;VENTOLIN HFA) 108 (90 Base) MCG/ACT inhaler Inhale 2 puffs into the lungs every 4 (four) hours as needed for wheezing or shortness of breath. 02/02/17   Doreene Nestlark, Katherine K, NP  aspirin 325 MG tablet Take 325 mg by mouth daily.    [provider]  clindamycin (CLEOCIN) 300 MG capsule Take 1 capsule (300 mg total) by mouth 3 (three) times daily for 7 days. 06/23/18 06/30/18  Georgene Kopper, PA-C  doxycycline (VIBRA-TABS) 100 MG tablet Take 1 tablet (100 mg total) by mouth daily. 04/03/18   Kuneff, Renee A, DO  Fenofibrate 150 MG CAPS Take 1 capsule (150 mg total)  by mouth daily. 03/08/18   Kuneff, Renee A, DO  fexofenadine (ALLEGRA) 180 MG tablet Take 180 mg by mouth daily.     [provider]  glimepiride (AMARYL) 4 MG tablet Take 1 tablet (4 mg total) by mouth daily. Patient not taking: Reported on 06/14/2018 12/25/17   Carlus Pavlov, MD  glucose blood (ONE TOUCH TEST STRIPS) test strip Use as instructed to test blood sugar 3 times daily 04/26/17   Doreene Nest, NP  insulin aspart (NOVOLOG FLEXPEN) 100 UNIT/ML FlexPen Inject 8-12 Units into the skin 3 (three) times daily with meals. 06/14/18   Carlus Pavlov, MD  Insulin Degludec  (TRESIBA FLEXTOUCH) 200 UNIT/ML SOPN Inject 30 Units into the skin daily. 06/14/18   Carlus Pavlov, MD  insulin NPH Human (HUMULIN N,NOVOLIN N) 100 UNIT/ML injection Inject 10 Units into the skin 2 (two) times daily.     [provider]  Insulin Syringe-Needle U-100 (RELION INSULIN SYRINGE 1ML/31G) 31G X 5/16" 1 ML MISC Use with insulin twice daily as directed. 07/11/17   Doreene Nest, NP  levothyroxine (SYNTHROID, LEVOTHROID) 75 MCG tablet TAKE ONE TABLET BY MOUTH EVERY MORNING ON AN EMPTY STOMACH WITH A FULL GLASS OF WATER 12/24/17   Kuneff, Renee A, DO  lithium carbonate 300 MG capsule Take 3 capsules (900 mg total) by mouth daily. 02/28/18   Oletta Darter, MD  metFORMIN (GLUCOPHAGE) 1000 MG tablet TAKE ONE TABLET BY MOUTH TWICE A DAY WITH A MEAL *NEED APPOINTMENT FOR REFILLS Patient not taking: Reported on 06/14/2018 06/04/18   Carlus Pavlov, MD  metFORMIN (GLUCOPHAGE) 500 MG tablet Take 1 tablet (500 mg total) by mouth 2 (two) times daily with a meal. 06/14/18   Carlus Pavlov, MD  mupirocin cream (BACTROBAN) 2 % Apply 1 application topically 2 (two) times daily. 06/23/18   Stanley Helmuth, PA-C  Omega-3 Fatty Acids (EQL FISH OIL PO) Take 1,000 mg by mouth 3 (three) times daily.     [provider]  Dola Argyle LANCETS 33G MISC Use as instructed to test blood sugar 3 times daily 06/19/17   Doreene Nest, NP  pseudoephedrine (SUDAFED) 120 MG 12 hr tablet Take 120 mg by mouth 2 (two) times daily.    [provider]  ranitidine (ZANTAC) 150 MG tablet Take 150 mg by mouth 2 (two) times daily.    [provider]  sildenafil (VIAGRA) 100 MG tablet Take 1 tablet (100 mg total) by mouth daily as needed for erectile dysfunction. 03/08/18   Kuneff, Renee A, DO  simvastatin (ZOCOR) 40 MG tablet Take 1 tablet (40 mg total) by mouth at bedtime. 03/08/18   Kuneff, Renee A, DO  tretinoin (RETIN-A) 0.05 % cream APPLY  CREAM TOPICALLY TO AFFECTED AREA AS  NEEDED 12/24/17   Kuneff, Renee A, DO  vitamin C (ASCORBIC ACID) 500 MG tablet Take 500 mg by mouth daily.    [provider]    Family History Family History  Problem Relation Age of Onset  . Heart attack Father   . Stroke Father   . Mental illness Father   . Heart disease Father   . Drug abuse Father   . Depression Father   . Alcohol abuse Father   . Arthritis Father   . Stroke Mother   . Diabetes Mother   . Depression Mother   . Arthritis Mother   . Mental illness Mother   . Depression Sister   . Lupus Sister   . Bipolar disorder Paternal  Aunt   . Lupus Sister   . Lupus Brother     Social History Social History   Tobacco Use  . Smoking status: Former Smoker    Packs/day: 2.00    Years: 35.00    Pack years: 70.00    Types: Cigarettes  . Smokeless tobacco: Never Used  . Tobacco comment: Vapor  Substance Use Topics  . Alcohol use: Yes    Alcohol/week: 0.0 standard drinks    Comment: a few beers a month  . Drug use: Not Currently    Comment: prior h/o MJ use     Allergies   Kava kava and Other   Review of Systems Review of Systems  Constitutional: Negative for fever.  Skin: Positive for wound.  All other systems reviewed and are negative.    Physical Exam Updated Vital Signs BP (!) 154/73 (BP Location: Left Arm)   Pulse 89   Temp 97.8 F (36.6 C) (Oral)   Resp 18   Ht 6\' 4"  (1.93 m)   Wt 107.5 kg   SpO2 100%   BMI 28.85 kg/m   Physical Exam  Constitutional: He is oriented to person, place, and time. He appears well-developed and well-nourished. No distress.  HENT:  Head: Normocephalic and atraumatic.  Superficial skin lesions overlying the nose without active purulence or drainage.  Skin of the nose is erythematous and nontender.  No lesions in the nares.  No lesions elsewhere on the face.  Eyes: Pupils are equal, round, and reactive to light. Conjunctivae and EOM are normal.  Neck: Normal range of motion. Neck supple.    Cardiovascular: Normal rate, regular rhythm and intact distal pulses.  Pulmonary/Chest: Effort normal and breath sounds normal. No respiratory distress. He has no wheezes.  Abdominal: Soft. He exhibits no distension. There is no tenderness.  Musculoskeletal: Normal range of motion.  Neurological: He is alert and oriented to person, place, and time.  Skin: Skin is warm and dry. Capillary refill takes less than 2 seconds.  Psychiatric: He has a normal mood and affect.  Nursing note and vitals reviewed.    ED Treatments / Results  Labs (all labs ordered are listed, but only abnormal results are displayed) Labs Reviewed  CBG MONITORING, ED - Abnormal; Notable for the following components:      Result Value   Glucose-Capillary 254 (*)    All other components within normal limits    EKG None  Radiology No results found.  Procedures Procedures (including critical care time)  Medications Ordered in ED Medications - No data to display   Initial Impression / Assessment and Plan / ED Course  I have reviewed the triage vital signs and the nursing notes.  Pertinent labs & imaging results that were available during my care of the patient were reviewed by me and considered in my medical decision making (see chart for details).     Patient presenting for evaluation of several week of skin lesions.  Reports history of similar, which she states is due to acne and often improves with antibiotics.  He is already on doxycycline daily.  Offered mupirocin cream, patient feels he needs further treatment with further oral antibiotics.  Discussed with attending, Dr. Judd Lien agrees to plan.  Will put on a short course of clindamycin along with mupirocin.  Regarding patient's refill for his doxycycline, I referred patient to follow-up with his PCP, as they were the one to prescribe it originally.  Discussed keeping the area clean with soap and  water.  Patient's blood sugar mildly elevated at 250, this is  baseline for him.  No fevers, chills, headache, vision changes.  Nasal passages patent.  Low suspicion for deeper infection, considering pt without other sxs and no fever.  At this time, patient appears safe for discharge.  Return precautions given.  Patient states he understands and agrees to plan.  Final Clinical Impressions(s) / ED Diagnoses   Final diagnoses:  Skin lesion    ED Discharge Orders         Ordered    mupirocin cream (BACTROBAN) 2 %  2 times daily     06/23/18 1521    clindamycin (CLEOCIN) 300 MG capsule  3 times daily     06/23/18 1521           Calven Gilkes, PA-C 06/23/18 1733    Geoffery Lyons, MD 06/23/18 2336

## 2018-06-24 ENCOUNTER — Encounter: Payer: Self-pay | Admitting: Family Medicine

## 2018-06-24 ENCOUNTER — Encounter: Payer: Self-pay | Admitting: Internal Medicine

## 2018-06-25 ENCOUNTER — Telehealth: Payer: Self-pay

## 2018-06-25 NOTE — Telephone Encounter (Signed)
Returned call to patient after receiving mychart message. Appt scheduled per patient request.

## 2018-06-26 ENCOUNTER — Encounter: Payer: Self-pay | Admitting: Family Medicine

## 2018-06-26 ENCOUNTER — Ambulatory Visit (INDEPENDENT_AMBULATORY_CARE_PROVIDER_SITE_OTHER): Payer: PPO | Admitting: Family Medicine

## 2018-06-26 ENCOUNTER — Telehealth: Payer: Self-pay

## 2018-06-26 ENCOUNTER — Encounter: Payer: Self-pay | Admitting: Internal Medicine

## 2018-06-26 VITALS — BP 108/71 | HR 91 | Temp 98.7°F | Resp 16 | Ht 76.0 in | Wt 222.0 lb

## 2018-06-26 DIAGNOSIS — L732 Hidradenitis suppurativa: Secondary | ICD-10-CM | POA: Diagnosis not present

## 2018-06-26 DIAGNOSIS — L700 Acne vulgaris: Secondary | ICD-10-CM | POA: Diagnosis not present

## 2018-06-26 MED ORDER — CLINDAMYCIN PHOSPHATE 1 % EX SOLN: Freq: Two times a day (BID) | CUTANEOUS | 11 refills | Status: DC

## 2018-06-26 MED ORDER — CLINDAMYCIN PHOSPHATE 1 % EX GEL: Freq: Two times a day (BID) | CUTANEOUS | 11 refills | Status: DC

## 2018-06-26 MED ORDER — DOXYCYCLINE HYCLATE 100 MG PO TABS: 100.0000 mg | ORAL_TABLET | Freq: Every day | ORAL | 0 refills | Status: DC

## 2018-06-26 NOTE — Telephone Encounter (Signed)
Message received from patient stating that he could not afford $200.00, pharmacy called and solution is cheaper at $15.00. Patient notified and verbalized understanding.

## 2018-06-26 NOTE — Progress Notes (Signed)
Willie Hughes , 1961/01/11, 57 y.o., male MRN: [id number] Patient Care Team    Relationship Specialty Notifications Start End  Natalia Leatherwood, DO PCP - General Family Medicine  12/19/17   Carlus Pavlov, MD Consulting Physician Endocrinology  12/24/17   Pollyann Savoy, MD Consulting Physician Rheumatology  02/26/18   Oletta Darter, MD  Psychiatry  04/03/18   Malachy Mood, MD Consulting Physician Hematology  04/03/18     Chief Complaint  Patient presents with  . bacterial infection     Subjective:  Willie Hughes is a 57 y.o. male present for skin infection on his nose. He has had similar skin infections in the past. He has had Pseudomonas ear infections as well in the past. He has been taking the daily doxy for acne control and it was doing well for him, until over the last 2-3 weeks. He has been referred to derm, but has not scheduled yet.  Prior note: Pt presents for an OV for follow up on his ear infections. He was seen in the ED for this condition. The ears were cultured and resulted with pseudomonas infection. He was prescribed clindamycin and ciprodex drops, which he has been using In his right ear. He feels his symptoms are improving, but still present. He does endorse decreased hearing and a sense of fullness in his right ear since the infection that is not improving. He denies fever, chills, ear pain, nausea.  He was also seen for a skin infection  Of his face. He has struggled with chronic acne and in the past has been able to keep it partially controlled with retin-A use. However over the last few months his acne has been more prominent. He has been referred to dermatology, but has yet to schedule. He feels the doxy prescribed in the ED did help with skin infection and calmed down his acne.   Depression screen [facility] 2/9 03/08/2018 12/19/2017  Decreased Interest 0 0  Down, Depressed, Hopeless 0 0  PHQ - 2 Score 0 0    Allergies  Allergen Reactions  . Kava Kava     Rash   . Other  Rash    Some antiperspirants cause a rash   Social History   Tobacco Use  . Smoking status: Former Smoker    Packs/day: 2.00    Years: 35.00    Pack years: 70.00    Types: Cigarettes  . Smokeless tobacco: Never Used  . Tobacco comment: Vapor  Substance Use Topics  . Alcohol use: Yes    Alcohol/week: 0.0 standard drinks    Comment: a few beers a month   Past Medical History:  Diagnosis Date  . Acne   . Allergic rhinitis   . Allergy   . Bipolar disorder (HCC)    managed by Psych- Dr. Michae Kava  . COPD (chronic obstructive pulmonary disease) (HCC)   . Erectile dysfunction   . Frequent headaches   . Heart disease   . Heart murmur   . Hyperlipidemia   . Hypothyroidism   . Pulmonary nodule 02/2018   Found on CT screen, Follow up due 02/2019  . Type 2 diabetes mellitus (HCC)    managed by Dr. Elvera Lennox.   Past Surgical History:  Procedure Laterality Date  . WISDOM TOOTH EXTRACTION     Family History  Problem Relation Age of Onset  . Heart attack Father   . Stroke Father   . Mental illness Father   . Heart disease Father   .  Drug abuse Father   . Depression Father   . Alcohol abuse Father   . Arthritis Father   . Stroke Mother   . Diabetes Mother   . Depression Mother   . Arthritis Mother   . Mental illness Mother   . Depression Sister   . Lupus Sister   . Bipolar disorder Paternal Aunt   . Lupus Sister   . Lupus Brother    Allergies as of 06/26/2018      Reactions   Kava Kava    Rash    Other Rash   Some antiperspirants cause a rash      Medication List        Accurate as of 06/26/18  1:47 PM. Always use your most recent med list.          albuterol 108 (90 Base) MCG/ACT inhaler Commonly known as:  PROVENTIL HFA;VENTOLIN HFA Inhale 2 puffs into the lungs every 4 (four) hours as needed for wheezing or shortness of breath.   aspirin 325 MG tablet Take 325 mg by mouth daily.   doxycycline 100 MG tablet Commonly known as:  VIBRA-TABS Take 1 tablet  (100 mg total) by mouth daily.   EQL FISH OIL PO Take 1,000 mg by mouth 3 (three) times daily.   Fenofibrate 150 MG Caps Take 1 capsule (150 mg total) by mouth daily.   fexofenadine 180 MG tablet Commonly known as:  ALLEGRA Take 180 mg by mouth daily.   glimepiride 4 MG tablet Commonly known as:  AMARYL Take 1 tablet (4 mg total) by mouth daily.   glucose blood test strip Use as instructed to test blood sugar 3 times daily   insulin aspart 100 UNIT/ML FlexPen Commonly known as:  NOVOLOG Inject 8-12 Units into the skin 3 (three) times daily with meals.   Insulin Degludec 200 UNIT/ML Sopn Inject 30 Units into the skin daily.   insulin NPH Human 100 UNIT/ML injection Commonly known as:  HUMULIN N,NOVOLIN N Inject 10 Units into the skin 2 (two) times daily.   Insulin Syringe-Needle U-100 31G X 5/16" 1 ML Misc Use with insulin twice daily as directed.   levothyroxine 75 MCG tablet Commonly known as:  SYNTHROID, LEVOTHROID TAKE ONE TABLET BY MOUTH EVERY MORNING ON AN EMPTY STOMACH WITH A FULL GLASS OF WATER   lithium carbonate 300 MG capsule Take 3 capsules (900 mg total) by mouth daily.   metFORMIN 1000 MG tablet Commonly known as:  GLUCOPHAGE TAKE ONE TABLET BY MOUTH TWICE A DAY WITH A MEAL *NEED APPOINTMENT FOR REFILLS   metFORMIN 500 MG tablet Commonly known as:  GLUCOPHAGE Take 1 tablet (500 mg total) by mouth 2 (two) times daily with a meal.   multivitamin tablet Take 1 tablet by mouth daily.   mupirocin cream 2 % Commonly known as:  BACTROBAN Apply 1 application topically 2 (two) times daily.   ONETOUCH DELICA LANCETS 33G Misc Use as instructed to test blood sugar 3 times daily   pseudoephedrine 120 MG 12 hr tablet Commonly known as:  SUDAFED Take 120 mg by mouth 2 (two) times daily.   ranitidine 150 MG tablet Commonly known as:  ZANTAC Take 150 mg by mouth 2 (two) times daily.   sildenafil 100 MG tablet Commonly known as:  VIAGRA Take 1 tablet  (100 mg total) by mouth daily as needed for erectile dysfunction.   simvastatin 40 MG tablet Commonly known as:  ZOCOR Take 1 tablet (40 mg total) by mouth  at bedtime.   tretinoin 0.05 % cream Commonly known as:  RETIN-A APPLY  CREAM TOPICALLY TO AFFECTED AREA AS NEEDED   vitamin B-12 500 MCG tablet Commonly known as:  CYANOCOBALAMIN Take 500 mcg by mouth daily.   vitamin C 500 MG tablet Commonly known as:  ASCORBIC ACID Take 500 mg by mouth daily.       All past medical history, surgical history, allergies, family history, immunizations andmedications were updated in the EMR today and reviewed under the history and medication portions of their EMR.     ROS: Negative, with the exception of above mentioned in HPI   Objective:  BP 108/71 (BP Location: Left Arm, Patient Position: Sitting, Cuff Size: Normal)   Pulse 91   Temp 98.7 F (37.1 C) (Oral)   Resp 16   Ht 6\' 4"  (1.93 m)   Wt 222 lb (100.7 kg)   SpO2 95%   BMI 27.02 kg/m  Body mass index is 27.02 kg/m. Gen: Afebrile. No acute distress.  HENT: AT. .  MMM.  Eyes:Pupils Equal Round Reactive to light, Extraocular movements intact,  Conjunctiva without redness, discharge or icterus. Skin: no purpura or petechiae. X5 pea sized patches of excoriated appearing lesions over nose. No drainage or erythema. Dime sized patch crusty erythema based lesion mid sternum.  Psych: Normal affect, dress and demeanor. Normal speech. Normal thought content and judgment.  No exam data present No results found. No results found for this or any previous visit (from the past 24 hour(s)).  Assessment/Plan: Willie MastLarry Hughes is a 57 y.o. male present for OV for  Acne vulgaris/Pseudomonas aeruginosa past infection He has struggled with chronic acne and has scarring. He was able to see benefit with the doxy BID dose when he had infection in the past.  - Doxy BID for 7 days, then decrease to QD again--> strongly encouraged him to make the derm  appt we have referred him to multiple dermatologist.  - refills on Doxy provided today. - start clindamycin gel BID- prescribed.  - F/U if worsening before able to get into derm.   Reviewed expectations re: course of current medical issues.  Discussed self-management of symptoms.  Outlined signs and symptoms indicating need for more acute intervention.  Patient verbalized understanding and all questions were answered.  Patient received an After-Visit Summary.  No orders of the defined types were placed in this encounter.    Note is dictated utilizing voice recognition software. Although note has been proof read prior to signing, occasional typographical errors still can be missed. If any questions arise, please do not hesitate to call for verification.   electronically signed by:  Felix Pacinienee Miriah Maruyama, DO  [facility] [place]

## 2018-06-26 NOTE — Patient Instructions (Addendum)
For the next 7 days take doxycyline every 12 hours. Then go back to once daily. I have refilled this for you.  I also called in the clindamycin gel to use twice daily. It looks like it is on your formulary.  Please schedule with dermatology.   Acne Acne is a skin problem that causes small, red bumps (pimples). Acne happens when the tiny holes in your skin (pores) get blocked. Your pores may become red, sore, and swollen. They may also become infected. Acne is a common skin problem. It is especially common in teenagers. Acne usually goes away over time. Follow these instructions at home: Good skin care is the most important thing you can do to treat your acne. Take care of your skin as told by your doctor. You may be told to do these things:  Wash your skin gently at least two times each day. You should also wash your skin: ? After you exercise. ? Before you go to bed.  Use mild soap.  Use a water-based skin moisturizer after you wash your skin.  Use a sunscreen or sunblock with SPF 30 or greater. This is very important if you are using acne medicines.  Choose cosmetics that will not plug your oil glands (are noncomedogenic).  Medicines  Take over-the-counter and prescription medicines only as told by your doctor.  If you were prescribed an antibiotic medicine, apply or take it as told by your doctor. Do not stop using the antibiotic even if your acne improves. General instructions  Keep your hair clean and off of your face. Shampoo your hair regularly. If you have oily hair, you may need to wash it every day.  Avoid leaning your chin or forehead on your hands.  Avoid wearing tight headbands or hats.  Avoid picking or squeezing your pimples. That can make your acne worse and cause scarring.  Keep all follow-up visits as told by your doctor. This is important.  Shave gently. Only shave when it is necessary.  Keep a food journal. This can help you to see if any foods are linked  with your acne. Contact a doctor if:  Your acne is not better after eight weeks.  Your acne gets worse.  You have a large area of skin that is red or tender.  You think that you are having side effects from any acne medicine. This information is not intended to replace advice given to you by your health care provider. Make sure you discuss any questions you have with your health care provider. Document Released: 06/29/2011 Document Revised: 12/16/2015 Document Reviewed: 09/16/2014 Elsevier Interactive Patient Education  [employer]2018 [employer]

## 2018-06-26 NOTE — Telephone Encounter (Signed)
Patient notified that he has an appointment at 1:45pm today and should arrive by 1 30pm. Sheculed per patient request for bacterial infection in nose.

## 2018-06-28 ENCOUNTER — Ambulatory Visit: Payer: Self-pay | Admitting: Family Medicine

## 2018-06-28 ENCOUNTER — Other Ambulatory Visit: Payer: Self-pay

## 2018-06-28 DIAGNOSIS — F3160 Bipolar disorder, current episode mixed, unspecified: Secondary | ICD-10-CM

## 2018-06-28 MED ORDER — LITHIUM CARBONATE 300 MG PO CAPS: 900.0000 mg | ORAL_CAPSULE | Freq: Every day | ORAL | 0 refills | Status: DC

## 2018-06-30 ENCOUNTER — Telehealth: Payer: PPO | Admitting: Family

## 2018-06-30 DIAGNOSIS — L709 Acne, unspecified: Secondary | ICD-10-CM

## 2018-06-30 NOTE — Progress Notes (Signed)
Thank you for the details you included in the comment boxes. Those details are very helpful in determining the best course of treatment for you and help us to provide the best care. I reviewed your chart carefully and Dr. Claiborne BillingsKuneff actually sent you the oral doxycycline as well as the topical clindamycin and strongly encouraged you to see a dermatologist. I concur with this plan.   These meds are available for you at the same pharmacy you asked for. Both of them. Please be seen at dermatology ASAP as your situation is beyond the scope of family medicine.  You will not be charged for this visit as I did not prescribe anything or offer any treatment plan other than what you were given by Dr. Claiborne BillingsKuneff on 06/26/18.   Best,  Constance HawJohn Julitza Rickles, DNP, FNP-BC [facility] E-Visit Team

## 2018-07-01 ENCOUNTER — Other Ambulatory Visit: Payer: Self-pay

## 2018-07-01 ENCOUNTER — Telehealth: Payer: Self-pay

## 2018-07-01 MED ORDER — INSULIN DEGLUDEC 200 UNIT/ML ~~LOC~~ SOPN: 30.0000 [IU] | PEN_INJECTOR | Freq: Every day | SUBCUTANEOUS | 3 refills | Status: DC

## 2018-07-01 NOTE — Telephone Encounter (Signed)
Patient was questioning if Clindamycin was suppose to be called into pharmacy. I explained to him that Doxycycline was sent in addition to the Clindamycin solution. Patient stated that he was confused and that the current medications were working well.

## 2018-07-08 ENCOUNTER — Encounter: Payer: Self-pay | Admitting: Family Medicine

## 2018-07-10 ENCOUNTER — Telehealth: Payer: Self-pay | Admitting: Family Medicine

## 2018-07-10 MED ORDER — FAMOTIDINE 20 MG PO TABS: 20.0000 mg | ORAL_TABLET | Freq: Two times a day (BID) | ORAL | 11 refills | Status: AC

## 2018-07-10 NOTE — Telephone Encounter (Signed)
MyChart message sent to patient.

## 2018-07-10 NOTE — Telephone Encounter (Signed)
Subject: Visit Follow-Up Question               Hi, I am going to run out of OTC generic Ranitidine next week. It seems to have been recalled everywhere and the FDA website recommends that people consider alternatives. What competing product would you prefer to see me switch to and the dosage? Hopefully something that is not expensive and is available as an OTC generic.    The clindamycin topical solution works incredibly well. I was using it over a wider area than you probably anticipated. I used it on my whole head including my scalp, my chest and my upper back and arms. A 60 ml bottle does not last long doing that so I was forced to not use it much. That is not a good way to use antibiotics. I do not think I will need to use it except during extreme outbreaks but when I do need it I will go through two or three bottles in a couple of weeks so please consider making refills available sooner.     I will set an appointment with dermatology tomorrow. I will let you know if I get triaged out toward the end of next year.    The above is an Control and instrumentation engineerechart message from pt, please call him.  1. The clindamycin is for daily use on his face. Use as directed. It is used DAILY to keep bacteria load down- preventing infections and acne-like flares. It is safe to use- DAILY as directed. 60 ml is the largest amount it comes and if used correctly should last him a month. I had called in  11 refills already on med. If he can get refills early is up to his insurance.  2. I called in Pepcid BID for him to try in place of the zantac.

## 2018-07-24 ENCOUNTER — Other Ambulatory Visit: Payer: Self-pay | Admitting: Psychiatry

## 2018-07-24 DIAGNOSIS — F3160 Bipolar disorder, current episode mixed, unspecified: Secondary | ICD-10-CM

## 2018-07-25 MED FILL — TRESIBA FLEXTOUCH 200 UNITS: 200 | 60 days supply | Qty: 9 | Fill #0

## 2018-07-26 ENCOUNTER — Encounter: Payer: Self-pay | Admitting: Rheumatology

## 2018-07-30 ENCOUNTER — Other Ambulatory Visit: Payer: Self-pay | Admitting: Primary Care

## 2018-07-31 ENCOUNTER — Encounter: Payer: Self-pay | Admitting: Internal Medicine

## 2018-07-31 ENCOUNTER — Other Ambulatory Visit: Payer: Self-pay

## 2018-07-31 MED ORDER — GLUCOSE BLOOD VI STRP: ORAL_STRIP | 2 refills | Status: AC

## 2018-07-31 MED ORDER — ONETOUCH DELICA LANCETS 33G MISC: 1 refills | Status: AC

## 2018-08-02 ENCOUNTER — Other Ambulatory Visit (HOSPITAL_COMMUNITY): Payer: Self-pay

## 2018-08-02 DIAGNOSIS — F3160 Bipolar disorder, current episode mixed, unspecified: Secondary | ICD-10-CM

## 2018-08-02 MED ORDER — LITHIUM CARBONATE 300 MG PO CAPS: 900.0000 mg | ORAL_CAPSULE | Freq: Every day | ORAL | 0 refills | Status: DC

## 2018-08-08 ENCOUNTER — Encounter (HOSPITAL_COMMUNITY): Payer: Self-pay | Admitting: Psychiatry

## 2018-08-08 ENCOUNTER — Ambulatory Visit (INDEPENDENT_AMBULATORY_CARE_PROVIDER_SITE_OTHER): Payer: PPO | Admitting: Psychiatry

## 2018-08-08 VITALS — BP 126/80 | Ht 76.0 in | Wt 226.0 lb

## 2018-08-08 DIAGNOSIS — F3160 Bipolar disorder, current episode mixed, unspecified: Secondary | ICD-10-CM

## 2018-08-08 DIAGNOSIS — R5383 Other fatigue: Secondary | ICD-10-CM

## 2018-08-08 DIAGNOSIS — Z79899 Other long term (current) drug therapy: Secondary | ICD-10-CM | POA: Diagnosis not present

## 2018-08-08 MED ORDER — LITHIUM CARBONATE 300 MG PO CAPS: 900.0000 mg | ORAL_CAPSULE | Freq: Every day | ORAL | 1 refills | Status: DC
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
## 2018-08-09 ENCOUNTER — Encounter: Payer: Self-pay | Admitting: Internal Medicine

## 2018-08-09 LAB — COMPREHENSIVE METABOLIC PANEL
A/G RATIO: 1.7 (ref 1.2–2.2)
ALT: 31 IU/L (ref 0–44)
AST: 32 IU/L (ref 0–40)
Albumin: 4.3 g/dL (ref 3.5–5.5)
Alkaline Phosphatase: 88 IU/L (ref 39–117)
BUN/Creatinine Ratio: 13 (ref 9–20)
BUN: 13 mg/dL (ref 6–24)
Bilirubin Total: 0.4 mg/dL (ref 0.0–1.2)
CALCIUM: 9.9 mg/dL (ref 8.7–10.2)
CO2: 22 mmol/L (ref 20–29)
CREATININE: 0.97 mg/dL (ref 0.76–1.27)
Chloride: 100 mmol/L (ref 96–106)
GFR, EST AFRICAN AMERICAN: 100 mL/min/{1.73_m2} (ref >59)
GFR, EST NON AFRICAN AMERICAN: 86 mL/min/{1.73_m2} (ref >59)
GLUCOSE: 265 mg/dL — AB (ref 65–99)
Globulin, Total: 2.5 g/dL (ref 1.5–4.5)
Potassium: 4 mmol/L (ref 3.5–5.2)
Sodium: 139 mmol/L (ref 134–144)
TOTAL PROTEIN: 6.8 g/dL (ref 6.0–8.5)

## 2018-08-09 LAB — LITHIUM LEVEL: Lithium Lvl: 0.6 mmol/L (ref 0.6–1.2)

## 2018-08-23 DIAGNOSIS — B009 Herpesviral infection, unspecified: Secondary | ICD-10-CM | POA: Diagnosis not present

## 2018-08-23 DIAGNOSIS — L709 Acne, unspecified: Secondary | ICD-10-CM | POA: Diagnosis not present

## 2018-08-31 ENCOUNTER — Encounter: Payer: Self-pay | Admitting: Internal Medicine

## 2018-09-02 ENCOUNTER — Encounter: Payer: Self-pay | Admitting: Internal Medicine

## 2018-09-03 ENCOUNTER — Other Ambulatory Visit: Payer: Self-pay | Admitting: Internal Medicine

## 2018-09-03 ENCOUNTER — Other Ambulatory Visit: Payer: Self-pay | Admitting: Family Medicine

## 2018-09-03 MED ORDER — INSULIN ASPART 100 UNIT/ML FLEXPEN: 15.0000 [IU] | PEN_INJECTOR | Freq: Three times a day (TID) | SUBCUTANEOUS | 3 refills | Status: AC

## 2018-09-03 MED ORDER — INSULIN DEGLUDEC 200 UNIT/ML ~~LOC~~ SOPN: 44.0000 [IU] | PEN_INJECTOR | Freq: Every day | SUBCUTANEOUS | 3 refills | Status: DC

## 2018-09-09 ENCOUNTER — Encounter: Payer: Self-pay | Admitting: Internal Medicine

## 2018-09-09 ENCOUNTER — Other Ambulatory Visit: Payer: Self-pay | Admitting: Internal Medicine

## 2018-09-09 MED ORDER — INSULIN DEGLUDEC 200 UNIT/ML ~~LOC~~ SOPN: 44.0000 [IU] | PEN_INJECTOR | Freq: Every day | SUBCUTANEOUS | 3 refills | Status: DC

## 2018-09-10 ENCOUNTER — Encounter: Payer: Self-pay | Admitting: Internal Medicine

## 2018-09-11 ENCOUNTER — Encounter: Payer: Self-pay | Admitting: Internal Medicine

## 2018-09-16 ENCOUNTER — Encounter: Payer: Self-pay | Admitting: Internal Medicine

## 2018-09-17 ENCOUNTER — Telehealth: Payer: Self-pay

## 2018-09-17 ENCOUNTER — Encounter: Payer: Self-pay | Admitting: Family Medicine

## 2018-09-17 NOTE — Telephone Encounter (Signed)
Pt sent My Chart message reading as follows:    Hi, I have been stocking up on OTC drugs and checking through my prescriptions in anticipation of the coming pandemic of Coronavirus. Since it seems to present very much like a severe case of the flu at least at first I am sure that my COPD and diabetes will make it that much more difficult for me. My goal is to get as much of the essential medications together so that I can minimize trips out. I figure that if I can get past the peak of the pandemic by at least a month or two I will have a better chance by the time I have to go to the hospital. I am not going to be able to avoid trips to the pharmacy and grocery store completely but with home delivery I can keep it to a minimum. Since they are finding that the virus can spread through pipes I will shut off my ventilation system and cover the vents with plastic. I may keep my kitchen drains plugged most of the time. I will seal my bathroom door with a plastic sheet but I will need to access it once in a while due to stuff piling up.    Pt was written back that I would forward information to Dr Claiborne Billings

## 2018-09-19 NOTE — Telephone Encounter (Signed)
Although I certainly understand his concern. Shutting off ventilation system may purpose other safety hazards more concerning than the low chance he has of exposure to the actual coronavirus. If he is wanting to take extra precautions, I would recommend he simply purchase a N95 mask (small white mask contractors use for chemicals and drywall work etc). They are only a couple of dollars  and prevent exposure to coronavirus, as well as hand washing.

## 2018-09-20 ENCOUNTER — Ambulatory Visit: Payer: Self-pay | Admitting: Internal Medicine

## 2018-09-23 ENCOUNTER — Encounter: Payer: Self-pay | Admitting: Family Medicine

## 2018-09-26 ENCOUNTER — Ambulatory Visit (INDEPENDENT_AMBULATORY_CARE_PROVIDER_SITE_OTHER): Payer: PPO | Admitting: Internal Medicine

## 2018-09-26 ENCOUNTER — Encounter: Payer: Self-pay | Admitting: Internal Medicine

## 2018-09-26 VITALS — BP 128/60 | HR 95 | Ht 76.0 in | Wt 230.0 lb

## 2018-09-26 DIAGNOSIS — IMO0002 Reserved for concepts with insufficient information to code with codable children: Secondary | ICD-10-CM

## 2018-09-26 DIAGNOSIS — E1142 Type 2 diabetes mellitus with diabetic polyneuropathy: Secondary | ICD-10-CM | POA: Diagnosis not present

## 2018-09-26 DIAGNOSIS — E1165 Type 2 diabetes mellitus with hyperglycemia: Secondary | ICD-10-CM | POA: Diagnosis not present

## 2018-09-26 DIAGNOSIS — E785 Hyperlipidemia, unspecified: Secondary | ICD-10-CM

## 2018-09-26 LAB — POCT GLYCOSYLATED HEMOGLOBIN (HGB A1C): Hemoglobin A1C: 9.1 % — AB (ref 4.0–5.6)

## 2018-09-26 NOTE — Progress Notes (Signed)
Patient ID: Willie Hughes, male   DOB: [birth year], 58 y.o.   MRN: [id number]   HPI: Willie Hughes is a 58 y.o.-year-old male, returning for follow-up for DM2, dx in 1999 - after Clozapine tx, insulin-dependent since 2018, uncontrolled, with complications (PN, ED).  Last visit less 3 months ago.  Last hemoglobin A1c was: Lab Results  Component Value Date   HGBA1C 9.4 (A) 06/14/2018   HGBA1C 10.3 (A) 12/18/2017   HGBA1C 9.7 (H) 09/26/2017   At last visit he was on:   >> ran out 05/2018 >> stopped - NPH 20 >> 15 units in a.m. and 15 units at bedtime  We changed to: - Metformin 500 mg 2x a day - Tresiba 30 >> 44 units daily -unfortunately, we had to give him samples as this was not affordable.  He feels that he may be able to afford it now.  He will try. - Novolog 15 min before each meal: 8-12 >> 15-18 units  He is now checking his sugars >4 times a day: - am:121-241, 311 >> 150, 181-220 >> 145-156 - 2h after b'fast: 245 >> n/c - before lunch: n/c >> 230-393 >> 200-242 - 2h after lunch: n/c >> 204-322 >> n/c - before dinner: n/c >> 230-251, 342 >> 137, 141 - 2h after dinner: n/c - bedtime: n/c - nighttime: n/c  Lowest: 120 >> 150 >> 145 Highest: 400s >> 320 >> 200s  Glucometer:  One Touch ultra  Pt's meals are: - Breakfast: OJ, yoghurt, 2 donuts, canned or fresh fruit - Lunch: 2-3 hot dogs + chips; 1 can of chilli; sandwich + chips - Dinner: meat + starch + salad/veggies - Snacks: diet ice tea, diet soda, cider I advised him to stop OJ and donuts at the previous visit.  -No CKD, last BUN/creatinine:  Lab Results  Component Value Date   BUN 13 08/08/2018   BUN 9 12/19/2017   CREATININE 0.97 08/08/2018   CREATININE 0.86 12/19/2017   -+ HL; last set of lipids: Lab Results  Component Value Date   CHOL 123 03/08/2018   HDL 29 (L) 03/08/2018   LDLCALC 66 03/08/2018   LDLDIRECT 73.0 09/26/2017   TRIG 228 (H) 03/08/2018   CHOLHDL 4.2 03/08/2018  On simvastatin 40, fenofibrate  150.  - last eye exam was in 10/2016: No DR  -He does have numbness and tingling in his feet.  Last foot exam by PCP was in 09/2017  Pt has FH of DM in mother.   He also has history of hypothyroidism due to lithium treatment, on levothyroxine 75 mcg daily.  Reviewed his latest TFTs: Normal: Lab Results  Component Value Date   TSH 2.840 11/22/2017   TSH 3.01 11/01/2017   TSH 5.49 (H) 09/26/2017   TSH 5.020 (H) 04/18/2017   TSH 5.08 (H) 03/09/2017   Psychiatrist: Dr. Michae Kava, but would like to start seeing somebody else.  She also has a history of COPD.  ROS: Constitutional: no weight gain/+ weight loss, + fatigue, no subjective hyperthermia, no subjective hypothermia Eyes: no blurry vision, no xerophthalmia ENT: no sore throat, no nodules palpated in neck, no dysphagia, no odynophagia, no hoarseness Cardiovascular: no CP/+ SOB/no palpitations/no leg swelling Respiratory: no cough/+ SOB/no wheezing Gastrointestinal: no N/no V/no D/no C/no acid reflux Musculoskeletal: no muscle aches/+ joint aches (knees) Skin: no rashes, no hair loss Neurological: no tremors/no numbness/no tingling/no dizziness  I reviewed pt's medications, allergies, PMH, social hx, family hx, and changes were documented in the history of  present illness. Otherwise, unchanged from my initial visit note.  Past Medical History:  Diagnosis Date  . Acne   . Allergic rhinitis   . Allergy   . Bipolar disorder (HCC)    managed by Psych- Dr. Michae Kava  . COPD (chronic obstructive pulmonary disease) (HCC)   . Erectile dysfunction   . Frequent headaches   . Heart disease   . Heart murmur   . Hyperlipidemia   . Hypothyroidism   . Pulmonary nodule 02/2018   Found on CT screen, Follow up due 02/2019  . Type 2 diabetes mellitus (HCC)    managed by Dr. Elvera Lennox.   Past Surgical History:  Procedure Laterality Date  . WISDOM TOOTH EXTRACTION     Social History   Socioeconomic History  . Marital status: Single     Spouse name: Not on file  . Number of children: 0  . Years of education: Not on file  . Highest education level: Not on file  Occupational History  . Not on file  Social Needs  . Financial resource strain: Not on file  . Food insecurity:    Worry: Not on file    Inability: Not on file  . Transportation needs:    Medical: Not on file    Non-medical: Not on file  Tobacco Use  . Smoking status: Former Smoker    Packs/day: 2.00    Years: 35.00    Pack years: 70.00    Types: Cigarettes  . Smokeless tobacco: Never Used  . Tobacco comment: Vapor  Substance and Sexual Activity  . Alcohol use: Yes    Alcohol/week: 0.0 standard drinks    Comment: a few beers a month  . Drug use: Not Currently    Comment: prior h/o MJ use  . Sexual activity: Not Currently  Lifestyle  . Physical activity:    Days per week: Not on file    Minutes per session: Not on file  . Stress: Not on file  Relationships  . Social connections:    Talks on phone: Not on file    Gets together: Not on file    Attends religious service: Not on file    Active member of club or organization: Not on file    Attends meetings of clubs or organizations: Not on file    Relationship status: Not on file  . Intimate partner violence:    Fear of current or ex partner: Not on file    Emotionally abused: Not on file    Physically abused: Not on file    Forced sexual activity: Not on file  Other Topics Concern  . Not on file  Social History Narrative   Single and has one cat. Living in [place]. Never married. No kids.    Born and raised in [place] by parents. 2 sisters and 1 brother and pt is the youngest. Pt has 2 associates degrees in Sports administrator   Disabled for Bipolar disorder.  Last worked 2008    Enjoys watching TV, playing on the Internet.   Current Outpatient Medications on File Prior to Visit  Medication Sig Dispense Refill  . albuterol (PROVENTIL HFA;VENTOLIN HFA) 108 (90 Base) MCG/ACT  inhaler Inhale 2 puffs into the lungs every 4 (four) hours as needed for wheezing or shortness of breath. (Patient not taking: Reported on 06/26/2018) 1 Inhaler 0  . aspirin 325 MG tablet Take 325 mg by mouth daily.    . clindamycin (CLEOCIN T) 1 % external solution  Apply topically 2 (two) times daily. (Patient not taking: Reported on 08/08/2018) 60 mL 11  . doxycycline (VIBRA-TABS) 100 MG tablet TAKE ONE TABLET BY MOUTH DAILY 90 tablet 0  . famotidine (PEPCID) 20 MG tablet Take 1 tablet (20 mg total) by mouth 2 (two) times daily. 60 tablet 11  . Fenofibrate 150 MG CAPS Take 1 capsule (150 mg total) by mouth daily. 90 each 3  . fexofenadine (ALLEGRA) 180 MG tablet Take 180 mg by mouth daily.     Marland Kitchen glucose blood (ONE TOUCH TEST STRIPS) test strip Use as instructed to test blood sugar 3 times daily 300 each 2  . insulin aspart (NOVOLOG FLEXPEN) 100 UNIT/ML FlexPen Inject 15-18 Units into the skin 3 (three) times daily with meals. 15 pen 3  . Insulin Degludec (TRESIBA FLEXTOUCH) 200 UNIT/ML SOPN Inject 44 Units into the skin daily. 9 pen 3  . Insulin Syringe-Needle U-100 (RELION INSULIN SYRINGE 1ML/31G) 31G X 5/16" 1 ML MISC Use with insulin twice daily as directed. 100 each 1  . levothyroxine (SYNTHROID, LEVOTHROID) 75 MCG tablet TAKE ONE TABLET BY MOUTH EVERY MORNING ON AN EMPTY STOMACH WITH A FULL GLASS OF WATER 90 tablet 3  . lithium carbonate 300 MG capsule Take 3 capsules (900 mg total) by mouth daily. 90 capsule 1  . Multiple Vitamin (MULTIVITAMIN) tablet Take 1 tablet by mouth daily.    . mupirocin cream (BACTROBAN) 2 % Apply 1 application topically 2 (two) times daily. (Patient not taking: Reported on 06/26/2018) 22 g 0  . Omega-3 Fatty Acids (EQL FISH OIL PO) Take 1,000 mg by mouth 3 (three) times daily.     Letta Pate DELICA LANCETS 33G MISC Use as instructed to test blood sugar 3 times daily 300 each 1  . pseudoephedrine (SUDAFED) 120 MG 12 hr tablet Take 120 mg by mouth 2 (two) times daily.     . sildenafil (VIAGRA) 100 MG tablet Take 1 tablet (100 mg total) by mouth daily as needed for erectile dysfunction. 16 tablet 5  . simvastatin (ZOCOR) 40 MG tablet Take 1 tablet (40 mg total) by mouth at bedtime. 90 tablet 3  . tretinoin (RETIN-A) 0.05 % cream APPLY  CREAM TOPICALLY TO AFFECTED AREA AS NEEDED (Patient not taking: Reported on 08/08/2018) 45 g 5  . vitamin B-12 (CYANOCOBALAMIN) 500 MCG tablet Take 500 mcg by mouth daily.    . vitamin C (ASCORBIC ACID) 500 MG tablet Take 500 mg by mouth daily.     No current facility-administered medications on file prior to visit.    Allergies  Allergen Reactions  . Kava Kava     Rash   . Other Rash    Some antiperspirants cause a rash   Family History  Problem Relation Age of Onset  . Heart attack Father   . Stroke Father   . Mental illness Father   . Heart disease Father   . Drug abuse Father   . Depression Father   . Alcohol abuse Father   . Arthritis Father   . Stroke Mother   . Diabetes Mother   . Depression Mother   . Arthritis Mother   . Mental illness Mother   . Depression Sister   . Lupus Sister   . Bipolar disorder Paternal Aunt   . Lupus Sister   . Lupus Brother     PE: BP 128/60   Pulse 95   Ht 6\' 4"  (1.93 m)   Wt 230 lb (104.3 kg)  BMI 28.00 kg/m  Wt Readings from Last 3 Encounters:  09/26/18 230 lb (104.3 kg)  06/26/18 222 lb (100.7 kg)  06/23/18 237 lb (107.5 kg)   Constitutional: overweight, in NAD Eyes: PERRLA, EOMI, no exophthalmos ENT: moist mucous membranes, no thyromegaly, no cervical lymphadenopathy Cardiovascular: RRR, No MRG Respiratory: CTA B Gastrointestinal: abdomen soft, NT, ND, BS+ Musculoskeletal: no deformities, strength intact in all 4 Skin: moist, warm, no rashes Neurological: no tremor with outstretched hands, DTR normal in all 4  ASSESSMENT: 1. DM2, insulin-dependent, uncontrolled, with complications - PN - ED  2. HL  3.  Overweight  PLAN:  1. Patient with  longstanding, uncontrolled, type 2 diabetes, on oral antidiabetic regimen and basal-bolus insulin regimen, with problems affording his medicines.  At last visit, he was off metformin and glimepiride.  He felt better off glimepiride.  We switched from intermediate acting insulin (NPH) and regular insulin to [place] and NovoLog per his request.  We had to i give him samples of [place] as he had a hard time paying for it.  I also advised him at last visit to start a lower dose metformin since this can really help with controlling his sugars. -At this visit, sugars have definitely improved in the morning and before dinner.  He does not have any checks at home as he was trying to wait 8 hours between meals to check sugars at that time.  I advised him that 4 to 5 hours is plenty. -His sugars are high before lunch most likely because of his breakfast.  He did not stop orange juice as advised at last visit.  I advised him not to vitamins anymore and we also discussed about adding protein and fat with breakfast, since now, he only has carbs (fresh fruit or canned foods).  Discussed about healthy sources of fat and protein.  We will also try to increase his NovoLog slightly with breakfast, but the change in the composition of this meal would be most beneficial. -I feel his Evaristo Bury dose is correct so we will continue this for now.  -I will advise him to check some sugars at bedtime to see if we need to change his NovoLog with dinner.  He will let me know in 2 weeks. - I suggested to:  Patient Instructions  Please continue: - Tresiba 44 units daily  Increase Novolog: - 18-20 units with b'fast - 18 with lunch and dinner  Try to rotate the Novolog injection sites as we discussed.  STOP Orange juice in am and add some protein and healthy fat with b'fast.  Check some sugars at bedtime.  Please return in 3 months with your sugar log.   - today, HbA1c is 9.1% (better) - continue checking sugars at different  times of the day - check 3x a day, rotating checks - advised for yearly eye exams >> he is UTD - Return to clinic in 3 mo with sugar log   2. HL - Reviewed latest lipid panel from 02/2018: LDL at goal, triglycerides high, HDL low Lab Results  Component Value Date   CHOL 123 03/08/2018   HDL 29 (L) 03/08/2018   LDLCALC 66 03/08/2018   LDLDIRECT 73.0 09/26/2017   TRIG 228 (H) 03/08/2018   CHOLHDL 4.2 03/08/2018  - Continues Zocor and fenofibrate without side effects.  3.  Overweight -Before last visit, he gained approximately 10 pounds.  At that time, he already started to reduce portions.  Unfortunately, we had to increase the insulin doses  then. -At this visit, he lost 7 pounds  Carlus Pavlov, MD PhD [name] Endocrinology

## 2018-09-26 NOTE — Patient Instructions (Addendum)
Please continue: - Tresiba 44 units daily  Increase Novolog: - 18-20 units with b'fast - 18 with lunch and dinner  Try to rotate the Novolog injection sites as we discussed.  STOP Orange juice in am and add some protein and healthy fat with b'fast.  Check some sugars at bedtime.  Please return in 3 months with your sugar log.

## 2018-09-26 NOTE — Addendum Note (Signed)
Addended by: Darliss Ridgel I on: 09/26/2018 04:34 PM   Modules accepted: Orders

## 2018-09-27 ENCOUNTER — Encounter: Payer: Self-pay | Admitting: Family Medicine

## 2018-10-06 ENCOUNTER — Encounter: Payer: Self-pay | Admitting: Family Medicine

## 2018-10-07 ENCOUNTER — Encounter: Payer: Self-pay | Admitting: Family Medicine

## 2018-10-07 ENCOUNTER — Encounter: Payer: Self-pay | Admitting: Internal Medicine

## 2018-10-08 ENCOUNTER — Ambulatory Visit: Payer: PPO | Admitting: Family Medicine

## 2018-10-08 ENCOUNTER — Ambulatory Visit: Payer: Self-pay | Admitting: *Deleted

## 2018-10-08 NOTE — Telephone Encounter (Addendum)
Spoke with patient regarding MyChart messages and leg pain/ambulation issues.  Patient reports severe leg pain x 1 week that is affecting his ability to walk, denies injury. Advised patient to call EMS for transport to ER for assessment. Patient states "it is too extreme to pay $3,000 for a ride to the ER where they would probably admit me." Discussed with patient the importance of being seen. Patient states he plans to call his insurance company to determine if a medical transport company would be a better option for him. Strongly encouraged he call EMS, declines at this time.   Discussed with patient his demand to only speak to MD or RN for advise. Assured patient clinical staff are communicating with provider and we rely heavily on our clinical team for communication between patients and provider. Patient states he has past experience with this clinic of receiving "personal opinions" rather than advise from provider.  Reassured that clinical staff communicate well with providers regarding patient care.   Patient states it may be tomorrow, but will go to ER for evaluation, plans to keep [place] updated.

## 2018-10-08 NOTE — Telephone Encounter (Signed)
Pt called regarding his MyChart message. He has an appointment scheduled for this afternoon and he wants his provider to know that he can not walk. He received a reply in his MyChart advising him to call 911 and go to the hospital. Pt is refusing this and he states because it came from an LPN.  See MyChart message.  He only wants advice from an RN or MD.  He stated that he had been worked up for an autoimmune disorder but could not find anything. He has diagnosed himself with multiple sclerosis.  He is also stating today that he might have had a mild case of the Coronavirus a couple of days ago.  He does not have a fever now or cough. He stated that he still has a headache and has taken medication for it.  Today he can not walk. So can not get into a vehicle  to come to the office.  Advised to call 911 so he could be evaluated. He refuses because he thinks that he would be taken to the hospital regardless if he wanted to go. He has no one to help him. So he requested that his appointment be canceled today.  He would like a call back from his pcp or nurse regarding the next steps he should take. Routing to flow at [facility] at [facility].  Office notified of telephone encounter.  Reason for Disposition . [1] Caller requests to speak ONLY to PCP AND [2] urgent question  Protocols used: PCP CALL - NO TRIAGE-A-AH

## 2018-10-10 ENCOUNTER — Ambulatory Visit (HOSPITAL_COMMUNITY): Payer: PPO | Admitting: Psychiatry

## 2018-10-16 ENCOUNTER — Encounter: Payer: Self-pay | Admitting: Rheumatology

## 2018-10-16 ENCOUNTER — Encounter: Payer: Self-pay | Admitting: Internal Medicine

## 2018-10-17 NOTE — Telephone Encounter (Signed)
It seems that patient symptoms are neurological.  I would advise for him to see his PCP or a neurologist.

## 2018-10-19 NOTE — Telephone Encounter (Signed)
Thank you. He does not have an auto immune diease.

## 2018-10-21 ENCOUNTER — Encounter: Payer: Self-pay | Admitting: Family Medicine

## 2018-10-23 ENCOUNTER — Other Ambulatory Visit: Payer: Self-pay

## 2018-10-23 ENCOUNTER — Encounter: Payer: Self-pay | Admitting: Family Medicine

## 2018-10-23 ENCOUNTER — Ambulatory Visit (INDEPENDENT_AMBULATORY_CARE_PROVIDER_SITE_OTHER): Payer: PPO | Admitting: Family Medicine

## 2018-10-23 ENCOUNTER — Other Ambulatory Visit (HOSPITAL_COMMUNITY): Payer: Self-pay

## 2018-10-23 DIAGNOSIS — R2681 Unsteadiness on feet: Secondary | ICD-10-CM

## 2018-10-23 DIAGNOSIS — E032 Hypothyroidism due to medicaments and other exogenous substances: Secondary | ICD-10-CM

## 2018-10-23 DIAGNOSIS — F3174 Bipolar disorder, in full remission, most recent episode manic: Secondary | ICD-10-CM

## 2018-10-23 DIAGNOSIS — L700 Acne vulgaris: Secondary | ICD-10-CM | POA: Diagnosis not present

## 2018-10-23 DIAGNOSIS — F3160 Bipolar disorder, current episode mixed, unspecified: Secondary | ICD-10-CM

## 2018-10-23 MED ORDER — LITHIUM CARBONATE 300 MG PO CAPS: 900.0000 mg | ORAL_CAPSULE | Freq: Every day | ORAL | 0 refills | Status: AC

## 2018-10-23 NOTE — Progress Notes (Signed)
Virtual Visit via Video   I connected with Willie Hughes on 10/23/18 at  2:30 PM EDT by a video enabled telemedicine application and verified that I am speaking with the correct person using two identifiers. Location patient: Home Location provider: [facility], [facility] Persons participating in the virtual visit: Patient, Dr. Claiborne BillingsKuneff and R.Baker, LPN  I discussed the limitations of evaluation and management by telemedicine and the availability of in person appointments. The patient expressed understanding and agreed to proceed.  Subjective:   Chief Complaint  Patient presents with  . Fatigue    x2-3 weeks.   . Muscle weakness    Using a bath stool as a walker. Pt is taking 1 doxycycline per day but bumped that up to 2x daily.   Marland Kitchen. Headache   HPI:  Willie Hughes is a 58 y.o. male seen today via virtual visit secondary to COVID-19 outbreak.  Patient reports he has been fatigued for the past 2 to 3 weeks.  He has had a headache off and on that has resolved.  He endorses muscle weakness to the point where he feels his gait is unstable and has made a walker for himself.  He is concerned he has multiple sclerosis.  He has declined to go to the emergency room multiple occasions when he has reported this weakness over the last few weeks.  Denies any bladder or bowel dysfunction.  He reports his ear is infected again.  He has had skin infections and an inner ear infection, Pseudomonas in etiology.  He is wondering if he has encephalitis.  Reports he has been using the clindamycin over his ear and skin has he had prior and he had been taking the doxycycline once a day and has recently increased it to twice a day.  He is now established with a dermatologist who is caring for his acne.  He denies fever, chills, nausea, bowel changes, cough, shortness of breath.  He does endorse fatigue and states he is "not walking.  " Patient reports weakness is mostly in his front of his legs all the way down to his  feet.  He denies any bladder or bowel loss of function or incontinence.  Patient has history of hypothyroidism and stopped his levothyroxine 5 mcg daily approximately 3 weeks ago.  He has a history of bipolar disorder and is managed by Dr. Michae KavaAgarwal at behavioral health on [address]. he reports he had been taken his lithium and until he "ran out "in the middle of March.  He was unable to go to any appointments secondary to the COVID-19 outbreak.  He states that his psychiatrist told him in the past that if he goes off the medicine she would not care for him any longer or hospitalize him?  He is overwhelmed with concern and panic surrounding the COVID-19 in will not leave his apartment.  ROS: See pertinent positives and negatives per HPI.  Patient Active Problem List   Diagnosis Date Noted  . Decreased hearing, right 04/03/2018  . Otitis externa due to Pseudomonas aeruginosa 04/03/2018  . Pulmonary nodule 03/13/2018  . Abnormal SPEP 02/26/2018  . Uncontrolled type 2 diabetes mellitus with peripheral neuropathy (HCC) 11/20/2017  . History of tobacco abuse 02/01/2017  . Arthritis 12/20/2016  . Chronic allergic rhinitis 09/20/2016  . Chronic headaches 09/20/2016  . Hypothyroidism 01/28/2016  . Hyperlipidemia 12/15/2015  . Erectile dysfunction 12/15/2015  . Acne vulgaris 12/15/2015  . Bipolar disorder (HCC) 12/15/2015    Social History  Tobacco Use  . Smoking status: Former Smoker    Packs/day: 2.00    Years: 35.00    Pack years: 70.00    Types: Cigarettes  . Smokeless tobacco: Never Used  . Tobacco comment: Vapor  Substance Use Topics  . Alcohol use: Yes    Alcohol/week: 0.0 standard drinks    Comment: a few beers a month    Current Outpatient Medications:  .  acetaminophen (TYLENOL) 325 MG tablet, Take 650 mg by mouth every 6 (six) hours as needed., Disp: , Rfl:  .  aspirin 325 MG tablet, Take 325 mg by mouth every 6 (six) hours as needed. Pt is taking  TID, Disp: , Rfl:  .   clindamycin (CLEOCIN T) 1 % external solution, Apply topically 2 (two) times daily., Disp: 60 mL, Rfl: 11 .  doxycycline (VIBRA-TABS) 100 MG tablet, TAKE ONE TABLET BY MOUTH DAILY, Disp: 90 tablet, Rfl: 0 .  famotidine (PEPCID) 20 MG tablet, Take 1 tablet (20 mg total) by mouth 2 (two) times daily. (Patient taking differently: Take 20 mg by mouth daily. ), Disp: 60 tablet, Rfl: 11 .  Fenofibrate 150 MG CAPS, Take 1 capsule (150 mg total) by mouth daily., Disp: 90 each, Rfl: 3 .  fexofenadine (ALLEGRA) 180 MG tablet, Take 180 mg by mouth daily. , Disp: , Rfl:  .  glucose blood (ONE TOUCH TEST STRIPS) test strip, Use as instructed to test blood sugar 3 times daily, Disp: 300 each, Rfl: 2 .  insulin aspart (NOVOLOG FLEXPEN) 100 UNIT/ML FlexPen, Inject 15-18 Units into the skin 3 (three) times daily with meals., Disp: 15 pen, Rfl: 3 .  Insulin Degludec (TRESIBA FLEXTOUCH) 200 UNIT/ML SOPN, Inject 44 Units into the skin daily., Disp: 9 pen, Rfl: 3 .  Insulin Syringe-Needle U-100 (RELION INSULIN SYRINGE 1ML/31G) 31G X 5/16" 1 ML MISC, Use with insulin twice daily as directed., Disp: 100 each, Rfl: 1 .  Multiple Vitamin (MULTIVITAMIN) tablet, Take 1 tablet by mouth daily., Disp: , Rfl:  .  Omega-3 Fatty Acids (EQL FISH OIL PO), Take 1,000 mg by mouth 3 (three) times daily. , Disp: , Rfl:  .  ONETOUCH DELICA LANCETS 33G MISC, Use as instructed to test blood sugar 3 times daily, Disp: 300 each, Rfl: 1 .  sildenafil (VIAGRA) 100 MG tablet, Take 1 tablet (100 mg total) by mouth daily as needed for erectile dysfunction., Disp: 16 tablet, Rfl: 5 .  simvastatin (ZOCOR) 40 MG tablet, Take 1 tablet (40 mg total) by mouth at bedtime., Disp: 90 tablet, Rfl: 3 .  tretinoin (RETIN-A) 0.05 % cream, APPLY  CREAM TOPICALLY TO AFFECTED AREA AS NEEDED, Disp: 45 g, Rfl: 5 .  vitamin B-12 (CYANOCOBALAMIN) 500 MCG tablet, Take 500 mcg by mouth daily., Disp: , Rfl:  .  vitamin C (ASCORBIC ACID) 500 MG tablet, Take 500 mg by  mouth daily., Disp: , Rfl:  .  albuterol (PROVENTIL HFA;VENTOLIN HFA) 108 (90 Base) MCG/ACT inhaler, Inhale 2 puffs into the lungs every 4 (four) hours as needed for wheezing or shortness of breath. (Patient not taking: Reported on 10/23/2018), Disp: 1 Inhaler, Rfl: 0 .  levothyroxine (SYNTHROID, LEVOTHROID) 75 MCG tablet, TAKE ONE TABLET BY MOUTH EVERY MORNING ON AN EMPTY STOMACH WITH A FULL GLASS OF WATER (Patient not taking: Reported on 10/23/2018), Disp: 90 tablet, Rfl: 3 .  lithium carbonate 300 MG capsule, Take 3 capsules (900 mg total) by mouth daily. (Patient not taking: Reported on 10/23/2018), Disp: 90 capsule, Rfl:  1  Allergies  Allergen Reactions  . Kava Kava     Rash   . Other Rash    Some antiperspirants cause a rash    Objective:  There were no vitals taken for this visit. Gen: No acute distress. Nontoxic in appearance.  HENT: AT. [place].  Eyes:  Conjunctiva without redness, discharge or icterus. Chest: Cough or shortness of breath not present Neuro: Normal gait. Alert. Oriented x3  Skin: Possibly mild erythema surrounding right ear, no obvious swelling or rash as prior. Psych: Normal affect.  Not wearing a shirt.  Normal speech in tone and speed.  Mild Paranoid behavior.  No SI or HI.  Assessment and Plan:  Gait instability -Visit today patient was walking around his living room without difficulty or assistance when he was getting his computer ready for the virtual visit.  When asked to turn on the light so that I could see his face/infection better he easily stood up from a seated position reached over a table and turned on a light without assistance. -I suspect he may be having some myalgias of his lower extremities, which very well may be or at least partially secondary to him stopping his thyroid replacement.  However uncertain if this is the cause since he is not certain on the exact date he stopped the medicine but it is around the same time. Does not have any reported fever,  nausea or vomit and no current headache which would rule against encephalitis as potential cause. Again discussed that if he has symptoms and where he has true weakness of his lower extremities, bladder or bowel function loss, fever etc. he will need to go to the emergency room to be assessed.  Hypothyroidism due to medication -Restart his levothyroxine 75 mcg today.  Plan to patient the effects of not taking this medicine, will some of which are muscle weakness and discomfort, among many other potential symptoms. - levothyroxine (SYNTHROID, LEVOTHROID) 75 MCG tablet; TAKE ONE TABLET BY MOUTH EVERY MORNING ON AN EMPTY STOMACH WITH A FULL GLASS OF WATER  Dispense: 90 tablet; Refill: 3 -Follow-up in 6 weeks in which he will need a TSH.  Acne vulgaris Given that it is a virtual visit I am uncertain if he has an inner ear infection again.  The skin he was able to show me on his face did not appear extremely red or swollen.  He states it is getting a little better over the last 2 days. -Continue with dermatology -Refilled his doxycycline to be taken twice daily for 10 days, and then return to daily.  Refill this today secondary to COVID-19 outbreak and his fear of leaving the home.  Dermatology will continue as they see fit post COVID-19. -Topical clindamycin also refilled for him today. She understands if symptoms worsen, infection spreads, fever or pain of his ear he is to be seen in the emergency room.  Bipolar disorder, in full remission, most recent episode manic [facility]) Patient strongly encouraged to call his psychiatrist.  He is no longer on his lithium which is going to create more problems obviously.  He is extremely fearful to leave the home during COVID-19 and will not even go outside his door secondary to a shared hallway with other tenants.  Explained to him that many providers are offering virtual visits during this time and his psychiatric provider may be doing so as well.  He has a fear that  his psychiatrist will hospitalize him since he is out of  medicine.  However, he agreed he would call and see if they would offer him a virtual visit.  Pt has declined many attempts to get him the appropriate care in the ED with his reports of abrupt instability in gait and does not want to present to the office either in fear of COVID19.    Felix Pacini, DO 10/23/2018

## 2018-10-24 ENCOUNTER — Encounter: Payer: Self-pay | Admitting: Family Medicine

## 2018-10-24 MED ORDER — DOXYCYCLINE HYCLATE 100 MG PO TABS: ORAL_TABLET | ORAL | 0 refills | Status: DC

## 2018-10-24 MED ORDER — LEVOTHYROXINE SODIUM 75 MCG PO TABS: ORAL_TABLET | ORAL | 3 refills | Status: DC

## 2018-10-24 MED ORDER — CLINDAMYCIN PHOSPHATE 1 % EX SOLN: Freq: Two times a day (BID) | CUTANEOUS | 11 refills | Status: DC

## 2018-10-24 NOTE — Patient Instructions (Signed)
Virtual visit 

## 2018-10-31 ENCOUNTER — Encounter: Payer: Self-pay | Admitting: Internal Medicine

## 2018-10-31 ENCOUNTER — Encounter: Payer: Self-pay | Admitting: Family Medicine

## 2018-11-03 ENCOUNTER — Encounter: Payer: Self-pay | Admitting: Family Medicine

## 2018-11-04 ENCOUNTER — Encounter: Payer: Self-pay | Admitting: Family Medicine

## 2018-11-04 NOTE — Telephone Encounter (Signed)
Passing message along  Please advise

## 2018-11-05 ENCOUNTER — Encounter: Payer: Self-pay | Admitting: Family Medicine

## 2018-11-05 ENCOUNTER — Telehealth: Payer: PPO | Admitting: Physician Assistant

## 2018-11-05 ENCOUNTER — Telehealth: Payer: Self-pay | Admitting: Physician Assistant

## 2018-11-05 ENCOUNTER — Telehealth: Payer: Self-pay

## 2018-11-05 DIAGNOSIS — R21 Rash and other nonspecific skin eruption: Secondary | ICD-10-CM

## 2018-11-05 NOTE — Telephone Encounter (Signed)
Per Dr. Claiborne Billings, referral for APS completed for safety/danger to self.  Spoke with Maida Sale, Social Worker [phone]). She will be contacting me with decision/status of report.

## 2018-11-05 NOTE — Telephone Encounter (Signed)
Officer SA Sherlon Handing of [employer] returning my call and reporting patient was not a threat to himself or others. Deliah Boston, MS, PA-C 12:12 PM, 11/05/2018

## 2018-11-05 NOTE — Telephone Encounter (Signed)
Given patient's bizarre presentation and history of mental illness with lack of medication compliance previously documented [employer] contacted and will conduct a well fair check.  My concern is that he is not sound of mind and may present a threat to himself or others.  He may require hospitalization to be cleared medically and to receive psychiatric stabilization. Deliah Boston, MS, PA-C 11:47 AM, 11/05/2018

## 2018-11-05 NOTE — Telephone Encounter (Signed)
FYI- Dr. Michae Kava - copied his psychiatrist to this documentation. Please see extensive phone notes and latest E-visit.

## 2018-11-05 NOTE — [facility] Notes (Signed)
Based on what you shared with me, I feel your condition warrants further evaluation and I recommend that you be seen for a face to face office visit.     NOTE: If you entered your credit card information for this eVisit, you will not be charged. You may see a "hold" on your card for the $35 but that hold will drop off and you will not have a charge processed.  If you are having a true medical emergency please call 911.  If you need an urgent face to face visit, [facility] has four urgent care centers for your convenience.    PLEASE NOTE: THE [facility] LOCATIONS AND URGENT CARE CLINICS DO NOT HAVE THE TESTING FOR CORONAVIRUS COVID19 AVAILABLE.  IF YOU FEEL YOU NEED THIS TEST YOU MUST GO TO A TRIAGE LOCATION AT ONE OF THE HOSPITAL EMERGENCY DEPARTMENTS   [email or web address] to reserve your spot online an avoid wait times  [facility] [address], [address] [place], [place] [address] Modified hours of operation: Monday-Friday, 10 AM to 6 PM  Saturday & Sunday 10 AM to 4 PM *Across the street from [employer]  [facility] (New Address!) [address], [address] [place], [place] [address] *Just off [address], across the road from [employer]* Modified hours of operation: Monday-Friday, 10 AM to 5 PM  Closed Saturday & Sunday   The following sites will take your insurance:  . [facility]  [phone] Get Driving Directions Find a Provider at this Location  [address] [place], [place] [address] . 10 am to 8 pm Monday-Friday . 12 pm to 8 pm Saturday-Sunday   . [facility]  [phone] Get Driving Directions Find a Provider at this Location  [address], [address] [place], [place] [address] . 8 am to 8 pm Monday-Friday . 9 am to 6 pm Saturday . 11 am to 6 pm Sunday   . [facility] [place]  [phone] Get Driving Directions  [address] [place],  [address] . 8 am to 8 pm Monday-Friday . 8 am to 4 pm Saturday-Sunday   Your e-visit answers were reviewed by a board certified advanced clinical practitioner to complete your personal care plan.  Thank you for using e-Visits. 

## 2018-11-06 NOTE — Telephone Encounter (Signed)
Spoke with Maxie Better, Adult Pensions consultant Print production planner).  Per Mrs. Dalene Carrow, she visited patient this morning for face to face contact.   Medical records faxed to APS at [phone] per records release.  APS will continue screening/evaluation process and records review.  She advised [place] to call her with any additional information regarding patient.  PH: [phone] Fax: [phone].

## 2018-11-07 ENCOUNTER — Telehealth: Payer: Self-pay | Admitting: Family Medicine

## 2018-11-07 NOTE — Telephone Encounter (Signed)
[facility] by [facility], DO, effective 11/05/18. Dismissal Letter sent out by 1st class mail. [employer]

## 2018-11-11 ENCOUNTER — Encounter: Payer: Self-pay | Admitting: Family Medicine

## 2018-12-20 DIAGNOSIS — R10819 Abdominal tenderness, unspecified site: Secondary | ICD-10-CM | POA: Diagnosis not present

## 2018-12-20 DIAGNOSIS — Z7982 Long term (current) use of aspirin: Secondary | ICD-10-CM | POA: Diagnosis not present

## 2018-12-20 DIAGNOSIS — E1110 Type 2 diabetes mellitus with ketoacidosis without coma: Secondary | ICD-10-CM | POA: Diagnosis not present

## 2018-12-20 DIAGNOSIS — E1165 Type 2 diabetes mellitus with hyperglycemia: Secondary | ICD-10-CM | POA: Diagnosis not present

## 2018-12-20 DIAGNOSIS — R0602 Shortness of breath: Secondary | ICD-10-CM | POA: Diagnosis not present

## 2018-12-20 DIAGNOSIS — Z79899 Other long term (current) drug therapy: Secondary | ICD-10-CM | POA: Diagnosis not present

## 2018-12-20 DIAGNOSIS — I10 Essential (primary) hypertension: Secondary | ICD-10-CM | POA: Diagnosis not present

## 2018-12-20 DIAGNOSIS — R197 Diarrhea, unspecified: Secondary | ICD-10-CM | POA: Diagnosis not present

## 2018-12-20 DIAGNOSIS — E876 Hypokalemia: Secondary | ICD-10-CM | POA: Diagnosis not present

## 2018-12-20 DIAGNOSIS — R1084 Generalized abdominal pain: Secondary | ICD-10-CM | POA: Diagnosis not present

## 2018-12-20 DIAGNOSIS — R2689 Other abnormalities of gait and mobility: Secondary | ICD-10-CM | POA: Diagnosis not present

## 2018-12-20 DIAGNOSIS — F319 Bipolar disorder, unspecified: Secondary | ICD-10-CM | POA: Diagnosis not present

## 2018-12-20 DIAGNOSIS — R52 Pain, unspecified: Secondary | ICD-10-CM | POA: Diagnosis not present

## 2018-12-20 DIAGNOSIS — K529 Noninfective gastroenteritis and colitis, unspecified: Secondary | ICD-10-CM | POA: Diagnosis not present

## 2018-12-20 DIAGNOSIS — R112 Nausea with vomiting, unspecified: Secondary | ICD-10-CM | POA: Diagnosis not present

## 2018-12-20 DIAGNOSIS — Z7409 Other reduced mobility: Secondary | ICD-10-CM | POA: Diagnosis not present

## 2018-12-20 DIAGNOSIS — Z87891 Personal history of nicotine dependence: Secondary | ICD-10-CM | POA: Diagnosis not present

## 2018-12-20 DIAGNOSIS — E785 Hyperlipidemia, unspecified: Secondary | ICD-10-CM | POA: Diagnosis not present

## 2018-12-20 DIAGNOSIS — I4510 Unspecified right bundle-branch block: Secondary | ICD-10-CM | POA: Diagnosis not present

## 2018-12-20 DIAGNOSIS — K760 Fatty (change of) liver, not elsewhere classified: Secondary | ICD-10-CM | POA: Diagnosis not present

## 2018-12-20 DIAGNOSIS — Z794 Long term (current) use of insulin: Secondary | ICD-10-CM | POA: Diagnosis not present

## 2018-12-21 DIAGNOSIS — R5381 Other malaise: Secondary | ICD-10-CM | POA: Diagnosis not present

## 2018-12-21 DIAGNOSIS — I4510 Unspecified right bundle-branch block: Secondary | ICD-10-CM | POA: Diagnosis not present

## 2018-12-21 DIAGNOSIS — E876 Hypokalemia: Secondary | ICD-10-CM | POA: Diagnosis not present

## 2018-12-21 DIAGNOSIS — I10 Essential (primary) hypertension: Secondary | ICD-10-CM | POA: Diagnosis not present

## 2018-12-21 DIAGNOSIS — F319 Bipolar disorder, unspecified: Secondary | ICD-10-CM | POA: Diagnosis not present

## 2018-12-21 DIAGNOSIS — K529 Noninfective gastroenteritis and colitis, unspecified: Secondary | ICD-10-CM | POA: Diagnosis not present

## 2018-12-21 DIAGNOSIS — E1165 Type 2 diabetes mellitus with hyperglycemia: Secondary | ICD-10-CM | POA: Diagnosis not present

## 2018-12-22 DIAGNOSIS — R2681 Unsteadiness on feet: Secondary | ICD-10-CM | POA: Diagnosis not present

## 2018-12-22 DIAGNOSIS — K529 Noninfective gastroenteritis and colitis, unspecified: Secondary | ICD-10-CM | POA: Diagnosis not present

## 2019-02-06 DIAGNOSIS — I10 Essential (primary) hypertension: Secondary | ICD-10-CM | POA: Diagnosis not present

## 2019-02-06 DIAGNOSIS — F3110 Bipolar disorder, current episode manic without psychotic features, unspecified: Secondary | ICD-10-CM | POA: Diagnosis not present

## 2019-02-06 DIAGNOSIS — E119 Type 2 diabetes mellitus without complications: Secondary | ICD-10-CM | POA: Diagnosis not present

## 2019-02-06 DIAGNOSIS — Z809 Family history of malignant neoplasm, unspecified: Secondary | ICD-10-CM | POA: Diagnosis not present

## 2019-02-06 DIAGNOSIS — E1165 Type 2 diabetes mellitus with hyperglycemia: Secondary | ICD-10-CM | POA: Diagnosis not present

## 2019-02-06 DIAGNOSIS — N529 Male erectile dysfunction, unspecified: Secondary | ICD-10-CM | POA: Diagnosis not present

## 2019-02-06 DIAGNOSIS — F250 Schizoaffective disorder, bipolar type: Secondary | ICD-10-CM | POA: Diagnosis not present

## 2019-02-06 DIAGNOSIS — Z832 Family history of diseases of the blood and blood-forming organs and certain disorders involving the immune mechanism: Secondary | ICD-10-CM | POA: Diagnosis not present

## 2019-02-06 DIAGNOSIS — Z7689 Persons encountering health services in other specified circumstances: Secondary | ICD-10-CM | POA: Diagnosis not present

## 2019-02-06 DIAGNOSIS — Z9119 Patient's noncompliance with other medical treatment and regimen: Secondary | ICD-10-CM | POA: Diagnosis not present

## 2019-02-06 DIAGNOSIS — Z818 Family history of other mental and behavioral disorders: Secondary | ICD-10-CM | POA: Diagnosis not present

## 2019-02-06 DIAGNOSIS — Z79899 Other long term (current) drug therapy: Secondary | ICD-10-CM | POA: Diagnosis not present

## 2019-02-06 DIAGNOSIS — E1169 Type 2 diabetes mellitus with other specified complication: Secondary | ICD-10-CM | POA: Diagnosis not present

## 2019-02-06 DIAGNOSIS — Z9114 Patient's other noncompliance with medication regimen: Secondary | ICD-10-CM | POA: Diagnosis not present

## 2019-02-06 DIAGNOSIS — Z79890 Hormone replacement therapy (postmenopausal): Secondary | ICD-10-CM | POA: Diagnosis not present

## 2019-02-06 DIAGNOSIS — Z833 Family history of diabetes mellitus: Secondary | ICD-10-CM | POA: Diagnosis not present

## 2019-02-06 DIAGNOSIS — R4789 Other speech disturbances: Secondary | ICD-10-CM | POA: Diagnosis not present

## 2019-02-06 DIAGNOSIS — F319 Bipolar disorder, unspecified: Secondary | ICD-10-CM | POA: Diagnosis not present

## 2019-02-06 DIAGNOSIS — E1142 Type 2 diabetes mellitus with diabetic polyneuropathy: Secondary | ICD-10-CM | POA: Diagnosis not present

## 2019-02-06 DIAGNOSIS — E039 Hypothyroidism, unspecified: Secondary | ICD-10-CM | POA: Diagnosis not present

## 2019-02-06 DIAGNOSIS — E785 Hyperlipidemia, unspecified: Secondary | ICD-10-CM | POA: Diagnosis not present

## 2019-02-06 DIAGNOSIS — F22 Delusional disorders: Secondary | ICD-10-CM | POA: Diagnosis not present

## 2019-02-06 DIAGNOSIS — Z8261 Family history of arthritis: Secondary | ICD-10-CM | POA: Diagnosis not present

## 2019-02-06 DIAGNOSIS — Z823 Family history of stroke: Secondary | ICD-10-CM | POA: Diagnosis not present

## 2019-02-06 DIAGNOSIS — Z8249 Family history of ischemic heart disease and other diseases of the circulatory system: Secondary | ICD-10-CM | POA: Diagnosis not present

## 2019-02-06 DIAGNOSIS — Z794 Long term (current) use of insulin: Secondary | ICD-10-CM | POA: Diagnosis not present

## 2019-02-06 DIAGNOSIS — Z638 Other specified problems related to primary support group: Secondary | ICD-10-CM | POA: Diagnosis not present

## 2019-02-12 MED ORDER — TRAZODONE HCL 50 MG PO TABS
50.00 | ORAL_TABLET | ORAL | Status: DC
Start: ? — End: 2019-02-12

## 2019-02-12 MED ORDER — ASPIRIN EC 81 MG PO TBEC
81.00 | DELAYED_RELEASE_TABLET | ORAL | Status: DC
Start: 2019-02-11 — End: 2019-02-12

## 2019-02-12 MED ORDER — HALOPERIDOL 5 MG PO TABS
5.00 | ORAL_TABLET | ORAL | Status: DC
Start: ? — End: 2019-02-12

## 2019-02-12 MED ORDER — ONDANSETRON 8 MG PO TBDP
8.00 | ORAL_TABLET | ORAL | Status: DC
Start: ? — End: 2019-02-12

## 2019-02-12 MED ORDER — BENZOCAINE-MENTHOL 6-10 MG MT LOZG
1.00 | LOZENGE | OROMUCOSAL | Status: DC
Start: ? — End: 2019-02-12

## 2019-02-12 MED ORDER — DEXTROSE 10 % IV SOLN
125.00 | INTRAVENOUS | Status: DC
Start: ? — End: 2019-02-12

## 2019-02-12 MED ORDER — ACETAMINOPHEN 325 MG PO TABS
650.00 | ORAL_TABLET | ORAL | Status: DC
Start: ? — End: 2019-02-12

## 2019-02-12 MED ORDER — INSULIN GLARGINE 100 UNIT/ML ~~LOC~~ SOLN
10.00 | SUBCUTANEOUS | Status: DC
Start: 2019-02-11 — End: 2019-02-12

## 2019-02-12 MED ORDER — GUAIFENESIN-DM 100-10 MG/5ML PO SYRP
10.00 | ORAL_SOLUTION | ORAL | Status: DC
Start: ? — End: 2019-02-12

## 2019-02-12 MED ORDER — LORAZEPAM 2 MG/ML IJ SOLN
2.00 | INTRAMUSCULAR | Status: DC
Start: ? — End: 2019-02-12

## 2019-02-12 MED ORDER — HYDROXYZINE HCL 25 MG PO TABS
50.00 | ORAL_TABLET | ORAL | Status: DC
Start: ? — End: 2019-02-12

## 2019-02-12 MED ORDER — BISACODYL 5 MG PO TBEC
10.00 | DELAYED_RELEASE_TABLET | ORAL | Status: DC
Start: ? — End: 2019-02-12

## 2019-02-12 MED ORDER — DIPHENHYDRAMINE HCL 25 MG PO CAPS
50.00 | ORAL_CAPSULE | ORAL | Status: DC
Start: ? — End: 2019-02-12

## 2019-02-12 MED ORDER — GENERIC EXTERNAL MEDICATION
1.00 | Status: DC
Start: 2019-02-11 — End: 2019-02-12

## 2019-02-12 MED ORDER — FENOFIBRATE 160 MG PO TABS
150.00 | ORAL_TABLET | ORAL | Status: DC
Start: 2019-02-11 — End: 2019-02-12

## 2019-02-12 MED ORDER — LOPERAMIDE HCL 2 MG PO CAPS
2.00 | ORAL_CAPSULE | ORAL | Status: DC
Start: ? — End: 2019-02-12

## 2019-02-12 MED ORDER — CETIRIZINE HCL 10 MG PO TABS
10.00 | ORAL_TABLET | ORAL | Status: DC
Start: ? — End: 2019-02-12

## 2019-02-12 MED ORDER — DIPHENHYDRAMINE HCL 50 MG/ML IJ SOLN
50.00 | INTRAMUSCULAR | Status: DC
Start: ? — End: 2019-02-12

## 2019-02-12 MED ORDER — ARIPIPRAZOLE 15 MG PO TABS
7.50 | ORAL_TABLET | ORAL | Status: DC
Start: 2019-02-11 — End: 2019-02-12

## 2019-02-12 MED ORDER — CLONIDINE HCL 0.1 MG PO TABS
.10 | ORAL_TABLET | ORAL | Status: DC
Start: ? — End: 2019-02-12

## 2019-02-12 MED ORDER — INSULIN LISPRO 100 UNIT/ML ~~LOC~~ SOLN
2.00 | SUBCUTANEOUS | Status: DC
Start: 2019-02-10 — End: 2019-02-12

## 2019-02-12 MED ORDER — ATORVASTATIN CALCIUM 10 MG PO TABS
20.00 | ORAL_TABLET | ORAL | Status: DC
Start: 2019-02-10 — End: 2019-02-12

## 2019-02-12 MED ORDER — SALINE NASAL SPRAY 0.65 % NA SOLN
1.00 | NASAL | Status: DC
Start: ? — End: 2019-02-12

## 2019-02-12 MED ORDER — GENERIC EXTERNAL MEDICATION
5.00 | Status: DC
Start: ? — End: 2019-02-12

## 2019-02-12 MED ORDER — GLUCOSE 40 % PO GEL
15.00 | ORAL | Status: DC
Start: ? — End: 2019-02-12

## 2019-02-12 MED ORDER — ALUMINUM-MAGNESIUM-SIMETHICONE 200-200-20 MG/5ML PO SUSP
30.00 | ORAL | Status: DC
Start: ? — End: 2019-02-12

## 2019-02-12 MED ORDER — GLUCAGON HCL RDNA (DIAGNOSTIC) 1 MG IJ SOLR
1.00 | INTRAMUSCULAR | Status: DC
Start: ? — End: 2019-02-12

## 2019-02-12 MED ORDER — LEVOTHYROXINE SODIUM 75 MCG PO TABS
75.00 | ORAL_TABLET | ORAL | Status: DC
Start: 2019-02-11 — End: 2019-02-12

## 2019-06-17 ENCOUNTER — Encounter (HOSPITAL_COMMUNITY): Payer: Self-pay

## 2019-06-17 ENCOUNTER — Inpatient Hospital Stay (HOSPITAL_COMMUNITY): Payer: PPO | Admitting: Anesthesiology

## 2019-06-17 ENCOUNTER — Emergency Department (HOSPITAL_COMMUNITY): Payer: PPO

## 2019-06-17 ENCOUNTER — Encounter (HOSPITAL_COMMUNITY)
Admission: EM | Disposition: A | Discharge status: Home / Self Care | Payer: Self-pay | Source: Home / Self Care | Attending: Family Medicine

## 2019-06-17 ENCOUNTER — Other Ambulatory Visit: Payer: Self-pay

## 2019-06-17 ENCOUNTER — Inpatient Hospital Stay (HOSPITAL_COMMUNITY)
Admission: EM | Admit: 2019-06-17 | Discharge: 2019-06-21 | DRG: 854 | Disposition: A | Discharge status: Home / Self Care | Payer: PPO | Attending: Family Medicine | Admitting: Family Medicine

## 2019-06-17 DIAGNOSIS — Z23 Encounter for immunization: Secondary | ICD-10-CM | POA: Diagnosis not present

## 2019-06-17 DIAGNOSIS — L709 Acne, unspecified: Secondary | ICD-10-CM | POA: Diagnosis not present

## 2019-06-17 DIAGNOSIS — E1165 Type 2 diabetes mellitus with hyperglycemia: Secondary | ICD-10-CM | POA: Diagnosis present

## 2019-06-17 DIAGNOSIS — J449 Chronic obstructive pulmonary disease, unspecified: Secondary | ICD-10-CM | POA: Diagnosis present

## 2019-06-17 DIAGNOSIS — N390 Urinary tract infection, site not specified: Secondary | ICD-10-CM | POA: Diagnosis present

## 2019-06-17 DIAGNOSIS — Z79890 Hormone replacement therapy (postmenopausal): Secondary | ICD-10-CM

## 2019-06-17 DIAGNOSIS — F3174 Bipolar disorder, in full remission, most recent episode manic: Secondary | ICD-10-CM | POA: Diagnosis not present

## 2019-06-17 DIAGNOSIS — R1111 Vomiting without nausea: Secondary | ICD-10-CM | POA: Diagnosis not present

## 2019-06-17 DIAGNOSIS — E032 Hypothyroidism due to medicaments and other exogenous substances: Secondary | ICD-10-CM

## 2019-06-17 DIAGNOSIS — Z8249 Family history of ischemic heart disease and other diseases of the circulatory system: Secondary | ICD-10-CM

## 2019-06-17 DIAGNOSIS — R1110 Vomiting, unspecified: Secondary | ICD-10-CM | POA: Diagnosis not present

## 2019-06-17 DIAGNOSIS — Z7982 Long term (current) use of aspirin: Secondary | ICD-10-CM

## 2019-06-17 DIAGNOSIS — R112 Nausea with vomiting, unspecified: Secondary | ICD-10-CM

## 2019-06-17 DIAGNOSIS — N412 Abscess of prostate: Secondary | ICD-10-CM | POA: Diagnosis not present

## 2019-06-17 DIAGNOSIS — IMO0002 Reserved for concepts with insufficient information to code with codable children: Secondary | ICD-10-CM | POA: Diagnosis present

## 2019-06-17 DIAGNOSIS — N453 Epididymo-orchitis: Secondary | ICD-10-CM | POA: Diagnosis present

## 2019-06-17 DIAGNOSIS — J309 Allergic rhinitis, unspecified: Secondary | ICD-10-CM | POA: Diagnosis not present

## 2019-06-17 DIAGNOSIS — T383X6A Underdosing of insulin and oral hypoglycemic [antidiabetic] drugs, initial encounter: Secondary | ICD-10-CM | POA: Diagnosis not present

## 2019-06-17 DIAGNOSIS — L8990 Pressure ulcer of unspecified site, unspecified stage: Secondary | ICD-10-CM | POA: Insufficient documentation

## 2019-06-17 DIAGNOSIS — E871 Hypo-osmolality and hyponatremia: Secondary | ICD-10-CM | POA: Diagnosis not present

## 2019-06-17 DIAGNOSIS — Z20828 Contact with and (suspected) exposure to other viral communicable diseases: Secondary | ICD-10-CM | POA: Diagnosis present

## 2019-06-17 DIAGNOSIS — Z823 Family history of stroke: Secondary | ICD-10-CM

## 2019-06-17 DIAGNOSIS — F319 Bipolar disorder, unspecified: Secondary | ICD-10-CM | POA: Diagnosis not present

## 2019-06-17 DIAGNOSIS — Z794 Long term (current) use of insulin: Secondary | ICD-10-CM

## 2019-06-17 DIAGNOSIS — E785 Hyperlipidemia, unspecified: Secondary | ICD-10-CM | POA: Diagnosis not present

## 2019-06-17 DIAGNOSIS — E1142 Type 2 diabetes mellitus with diabetic polyneuropathy: Secondary | ICD-10-CM | POA: Diagnosis present

## 2019-06-17 DIAGNOSIS — A4102 Sepsis due to Methicillin resistant Staphylococcus aureus: Principal | ICD-10-CM | POA: Diagnosis present

## 2019-06-17 DIAGNOSIS — Z91120 Patient's intentional underdosing of medication regimen due to financial hardship: Secondary | ICD-10-CM | POA: Diagnosis not present

## 2019-06-17 DIAGNOSIS — Z79899 Other long term (current) drug therapy: Secondary | ICD-10-CM | POA: Diagnosis not present

## 2019-06-17 DIAGNOSIS — L89310 Pressure ulcer of right buttock, unstageable: Secondary | ICD-10-CM | POA: Diagnosis not present

## 2019-06-17 DIAGNOSIS — E039 Hypothyroidism, unspecified: Secondary | ICD-10-CM | POA: Diagnosis present

## 2019-06-17 DIAGNOSIS — Z91128 Patient's intentional underdosing of medication regimen for other reason: Secondary | ICD-10-CM | POA: Diagnosis not present

## 2019-06-17 DIAGNOSIS — R81 Glycosuria: Secondary | ICD-10-CM | POA: Diagnosis not present

## 2019-06-17 DIAGNOSIS — E1140 Type 2 diabetes mellitus with diabetic neuropathy, unspecified: Secondary | ICD-10-CM | POA: Diagnosis not present

## 2019-06-17 DIAGNOSIS — Z832 Family history of diseases of the blood and blood-forming organs and certain disorders involving the immune mechanism: Secondary | ICD-10-CM

## 2019-06-17 DIAGNOSIS — Z833 Family history of diabetes mellitus: Secondary | ICD-10-CM

## 2019-06-17 DIAGNOSIS — Z87891 Personal history of nicotine dependence: Secondary | ICD-10-CM

## 2019-06-17 HISTORY — PX: TRANSURETHRAL RESECTION OF PROSTATE: SHX73

## 2019-06-17 LAB — URINALYSIS, ROUTINE W REFLEX MICROSCOPIC
Bilirubin Urine: NEGATIVE
Glucose, UA: >=500 mg/dL — AB
Hgb urine dipstick: NEGATIVE
Ketones, ur: 20 mg/dL — AB
Nitrite: NEGATIVE
Protein, ur: NEGATIVE mg/dL
Specific Gravity, Urine: 1.028 (ref 1.005–1.030)
WBC, UA: >50 WBC/hpf — ABNORMAL HIGH (ref 0–5)
pH: 7 (ref 5.0–8.0)

## 2019-06-17 LAB — BASIC METABOLIC PANEL
Anion gap: 15 (ref 5–15)
BUN: 13 mg/dL (ref 6–20)
CO2: 22 mmol/L (ref 22–32)
Calcium: 9 mg/dL (ref 8.9–10.3)
Chloride: 95 mmol/L — ABNORMAL LOW (ref 98–111)
Creatinine, Ser: 0.73 mg/dL (ref 0.61–1.24)
GFR calc Af Amer: >60 mL/min (ref >60)
GFR calc non Af Amer: >60 mL/min (ref >60)
Glucose, Bld: 482 mg/dL — ABNORMAL HIGH (ref 70–99)
Potassium: 4.1 mmol/L (ref 3.5–5.1)
Sodium: 132 mmol/L — ABNORMAL LOW (ref 135–145)

## 2019-06-17 LAB — SARS CORONAVIRUS 2 BY RT PCR (HOSPITAL ORDER, PERFORMED IN ~~LOC~~ HOSPITAL LAB): SARS Coronavirus 2: NEGATIVE

## 2019-06-17 LAB — CBC
HCT: 44.3 % (ref 39.0–52.0)
Hemoglobin: 14.6 g/dL (ref 13.0–17.0)
MCH: 30 pg (ref 26.0–34.0)
MCHC: 33 g/dL (ref 30.0–36.0)
MCV: 91.2 fL (ref 80.0–100.0)
Platelets: 274 10*3/uL (ref 150–400)
RBC: 4.86 MIL/uL (ref 4.22–5.81)
RDW: 12.2 % (ref 11.5–15.5)
WBC: 25 10*3/uL — ABNORMAL HIGH (ref 4.0–10.5)
nRBC: 0 % (ref 0.0–0.2)

## 2019-06-17 LAB — DIFFERENTIAL
Abs Immature Granulocytes: 0.29 10*3/uL — ABNORMAL HIGH (ref 0.00–0.07)
Basophils Absolute: 0.1 10*3/uL (ref 0.0–0.1)
Basophils Relative: 0 %
Eosinophils Absolute: 0 10*3/uL (ref 0.0–0.5)
Eosinophils Relative: 0 %
Immature Granulocytes: 1 %
Lymphocytes Relative: 2 %
Lymphs Abs: 0.6 10*3/uL — ABNORMAL LOW (ref 0.7–4.0)
Monocytes Absolute: 1.4 10*3/uL — ABNORMAL HIGH (ref 0.1–1.0)
Monocytes Relative: 6 %
Neutro Abs: 22.9 10*3/uL — ABNORMAL HIGH (ref 1.7–7.7)
Neutrophils Relative %: 91 %

## 2019-06-17 LAB — CBG MONITORING, ED
Glucose-Capillary: 470 mg/dL — ABNORMAL HIGH (ref 70–99)
Glucose-Capillary: 453 mg/dL — ABNORMAL HIGH (ref 70–99)

## 2019-06-17 LAB — BLOOD GAS, VENOUS
Acid-Base Excess: 2.4 mmol/L — ABNORMAL HIGH (ref 0.0–2.0)
Bicarbonate: 26.5 mmol/L (ref 20.0–28.0)
O2 Saturation: 87.5 %
Patient temperature: 98.6
pCO2, Ven: 40.9 mmHg — ABNORMAL LOW (ref 44.0–60.0)
pH, Ven: 7.427 (ref 7.250–7.430)
pO2, Ven: 52.8 mmHg — ABNORMAL HIGH (ref 32.0–45.0)

## 2019-06-17 LAB — GLUCOSE, CAPILLARY: Glucose-Capillary: 392 mg/dL — ABNORMAL HIGH (ref 70–99)

## 2019-06-17 LAB — BETA-HYDROXYBUTYRIC ACID: Beta-Hydroxybutyric Acid: 2.32 mmol/L — ABNORMAL HIGH (ref 0.05–0.27)

## 2019-06-17 SURGERY — TURP (TRANSURETHRAL RESECTION OF PROSTATE)
Anesthesia: General

## 2019-06-17 MED ORDER — FENTANYL CITRATE (PF) 100 MCG/2ML IJ SOLN
INTRAMUSCULAR | Status: AC
  Filled 2019-06-17: qty 2

## 2019-06-17 MED ORDER — HEPARIN SODIUM (PORCINE) 5000 UNIT/ML IJ SOLN
5000.0000 [IU] | Freq: Three times a day (TID) | INTRAMUSCULAR | Status: DC
  Administered 2019-06-18 – 2019-06-21 (×7): 5000 [IU] via SUBCUTANEOUS
  Filled 2019-06-17 (×8): qty 1

## 2019-06-17 MED ORDER — VITAMIN C 500 MG PO TABS
500.0000 mg | ORAL_TABLET | Freq: Every day | ORAL | Status: DC
  Administered 2019-06-18 – 2019-06-21 (×4): 500 mg via ORAL
  Filled 2019-06-17 (×4): qty 1

## 2019-06-17 MED ORDER — FENTANYL CITRATE (PF) 100 MCG/2ML IJ SOLN
25.0000 ug | INTRAMUSCULAR | Status: DC | PRN
  Administered 2019-06-17: 50 ug via INTRAVENOUS

## 2019-06-17 MED ORDER — LIDOCAINE 2% (20 MG/ML) 5 ML SYRINGE
INTRAMUSCULAR | Status: DC | PRN
  Administered 2019-06-17: 100 mg via INTRAVENOUS

## 2019-06-17 MED ORDER — SODIUM CHLORIDE 0.9 % IV BOLUS
1000.0000 mL | Freq: Once | INTRAVENOUS | Status: AC
  Administered 2019-06-17: 1000 mL via INTRAVENOUS

## 2019-06-17 MED ORDER — CEPHALEXIN 500 MG PO CAPS: 500.0000 mg | ORAL_CAPSULE | Freq: Three times a day (TID) | ORAL | 0 refills | Status: AC

## 2019-06-17 MED ORDER — SODIUM CHLORIDE 0.9 % IR SOLN
Status: DC | PRN
  Administered 2019-06-17: 6000 mL

## 2019-06-17 MED ORDER — ONDANSETRON HCL 4 MG/2ML IJ SOLN
INTRAMUSCULAR | Status: AC
  Filled 2019-06-17: qty 2

## 2019-06-17 MED ORDER — EQL FISH OIL 1000 MG PO CAPS: 1000.0000 mg | ORAL_CAPSULE | Freq: Three times a day (TID) | ORAL | Status: DC

## 2019-06-17 MED ORDER — LIDOCAINE 2% (20 MG/ML) 5 ML SYRINGE
INTRAMUSCULAR | Status: AC
  Filled 2019-06-17: qty 5

## 2019-06-17 MED ORDER — BISACODYL 10 MG RE SUPP: 10.0000 mg | Freq: Every day | RECTAL | Status: DC | PRN

## 2019-06-17 MED ORDER — ACETAMINOPHEN 325 MG PO TABS: 650.0000 mg | ORAL_TABLET | Freq: Four times a day (QID) | ORAL | Status: DC | PRN

## 2019-06-17 MED ORDER — INSULIN ASPART 100 UNIT/ML ~~LOC~~ SOLN
SUBCUTANEOUS | Status: AC
  Filled 2019-06-17: qty 1

## 2019-06-17 MED ORDER — SODIUM CHLORIDE 0.9 % IV SOLN
INTRAVENOUS | Status: DC
  Administered 2019-06-17: via INTRAVENOUS

## 2019-06-17 MED ORDER — SUCCINYLCHOLINE CHLORIDE 200 MG/10ML IV SOSY
PREFILLED_SYRINGE | INTRAVENOUS | Status: AC
  Filled 2019-06-17: qty 10

## 2019-06-17 MED ORDER — TRAMADOL HCL 50 MG PO TABS
50.0000 mg | ORAL_TABLET | Freq: Four times a day (QID) | ORAL | Status: DC | PRN
  Administered 2019-06-18: 50 mg via ORAL
  Filled 2019-06-17: qty 1

## 2019-06-17 MED ORDER — CIPROFLOXACIN IN D5W 400 MG/200ML IV SOLN
400.0000 mg | Freq: Two times a day (BID) | INTRAVENOUS | Status: DC
  Administered 2019-06-18 – 2019-06-19 (×3): 400 mg via INTRAVENOUS
  Filled 2019-06-17 (×3): qty 200

## 2019-06-17 MED ORDER — CLINDAMYCIN PHOSPHATE 1 % EX SOLN
Freq: Two times a day (BID) | CUTANEOUS | Status: DC
  Administered 2019-06-18 – 2019-06-19 (×2): via TOPICAL
  Administered 2019-06-19: 1 via TOPICAL
  Administered 2019-06-20 (×2): via TOPICAL
  Filled 2019-06-17: qty 30

## 2019-06-17 MED ORDER — HEPARIN SODIUM (PORCINE) 5000 UNIT/ML IJ SOLN: 5000.0000 [IU] | Freq: Three times a day (TID) | INTRAMUSCULAR | Status: DC

## 2019-06-17 MED ORDER — GENTAMICIN SULFATE 40 MG/ML IJ SOLN
7.0000 mg/kg | INTRAVENOUS | Status: DC
  Administered 2019-06-17 – 2019-06-18 (×2): 590 mg via INTRAVENOUS
  Filled 2019-06-17 (×3): qty 14.75

## 2019-06-17 MED ORDER — SODIUM CHLORIDE 0.9 % IV SOLN
2.0000 g | Freq: Once | INTRAVENOUS | Status: AC
  Administered 2019-06-17: 2 g via INTRAVENOUS
  Filled 2019-06-17: qty 20

## 2019-06-17 MED ORDER — MIDAZOLAM HCL 2 MG/2ML IJ SOLN
INTRAMUSCULAR | Status: DC | PRN
  Administered 2019-06-17: 1 mg via INTRAVENOUS

## 2019-06-17 MED ORDER — ALBUTEROL SULFATE (2.5 MG/3ML) 0.083% IN NEBU: 2.5000 mg | INHALATION_SOLUTION | RESPIRATORY_TRACT | Status: DC | PRN

## 2019-06-17 MED ORDER — OXYCODONE HCL 5 MG/5ML PO SOLN: 5.0000 mg | Freq: Once | ORAL | Status: DC | PRN

## 2019-06-17 MED ORDER — CLINDAMYCIN PHOSPHATE 1 % EX SOLN
Freq: Two times a day (BID) | CUTANEOUS | Status: DC
  Filled 2019-06-17: qty 30

## 2019-06-17 MED ORDER — SODIUM CHLORIDE 0.9 % IV SOLN
INTRAVENOUS | Status: DC
  Administered 2019-06-17: 20:00:00 via INTRAVENOUS

## 2019-06-17 MED ORDER — IOHEXOL 300 MG/ML  SOLN
100.0000 mL | Freq: Once | INTRAMUSCULAR | Status: AC | PRN
  Administered 2019-06-17: 100 mL via INTRAVENOUS

## 2019-06-17 MED ORDER — INSULIN ASPART 100 UNIT/ML ~~LOC~~ SOLN
0.0000 [IU] | Freq: Three times a day (TID) | SUBCUTANEOUS | Status: DC
  Administered 2019-06-18: 8 [IU] via SUBCUTANEOUS
  Administered 2019-06-18: 15 [IU] via SUBCUTANEOUS
  Administered 2019-06-18 – 2019-06-19 (×2): 8 [IU] via SUBCUTANEOUS

## 2019-06-17 MED ORDER — ONDANSETRON HCL 4 MG PO TABS: 4.0000 mg | ORAL_TABLET | Freq: Four times a day (QID) | ORAL | Status: DC | PRN

## 2019-06-17 MED ORDER — FENTANYL CITRATE (PF) 250 MCG/5ML IJ SOLN
INTRAMUSCULAR | Status: DC | PRN
  Administered 2019-06-17: 50 ug via INTRAVENOUS
  Administered 2019-06-17: 100 ug via INTRAVENOUS
  Administered 2019-06-17: 50 ug via INTRAVENOUS

## 2019-06-17 MED ORDER — PROMETHAZINE HCL 25 MG/ML IJ SOLN: 6.2500 mg | INTRAMUSCULAR | Status: DC | PRN

## 2019-06-17 MED ORDER — SODIUM CHLORIDE 0.9 % IR SOLN
3000.0000 mL | Status: DC
  Administered 2019-06-17 – 2019-06-18 (×2): 3000 mL

## 2019-06-17 MED ORDER — LEVOTHYROXINE SODIUM 75 MCG PO TABS
75.0000 ug | ORAL_TABLET | Freq: Every day | ORAL | Status: DC
  Filled 2019-06-17 (×2): qty 1

## 2019-06-17 MED ORDER — MIDAZOLAM HCL 2 MG/2ML IJ SOLN
INTRAMUSCULAR | Status: AC
  Filled 2019-06-17: qty 2

## 2019-06-17 MED ORDER — OXYCODONE HCL 5 MG PO TABS: 5.0000 mg | ORAL_TABLET | Freq: Once | ORAL | Status: DC | PRN

## 2019-06-17 MED ORDER — ONDANSETRON 4 MG PO TBDP: 4.0000 mg | ORAL_TABLET | Freq: Three times a day (TID) | ORAL | 0 refills | Status: DC | PRN

## 2019-06-17 MED ORDER — INSULIN ASPART 100 UNIT/ML ~~LOC~~ SOLN
0.0000 [IU] | Freq: Every day | SUBCUTANEOUS | Status: DC
  Administered 2019-06-18 (×2): 4 [IU] via SUBCUTANEOUS

## 2019-06-17 MED ORDER — ADULT MULTIVITAMIN W/MINERALS CH
1.0000 | ORAL_TABLET | Freq: Every day | ORAL | Status: DC
  Administered 2019-06-18 – 2019-06-21 (×4): 1 via ORAL
  Filled 2019-06-17 (×4): qty 1

## 2019-06-17 MED ORDER — SENNOSIDES-DOCUSATE SODIUM 8.6-50 MG PO TABS: 1.0000 | ORAL_TABLET | Freq: Every evening | ORAL | Status: DC | PRN

## 2019-06-17 MED ORDER — LORATADINE 10 MG PO TABS
10.0000 mg | ORAL_TABLET | Freq: Every day | ORAL | Status: DC
  Administered 2019-06-18 – 2019-06-21 (×4): 10 mg via ORAL
  Filled 2019-06-17 (×4): qty 1

## 2019-06-17 MED ORDER — OMEGA-3-ACID ETHYL ESTERS 1 G PO CAPS
1.0000 g | ORAL_CAPSULE | Freq: Every day | ORAL | Status: DC
  Administered 2019-06-18 – 2019-06-21 (×4): 1 g via ORAL
  Filled 2019-06-17 (×4): qty 1

## 2019-06-17 MED ORDER — INSULIN GLARGINE 100 UNIT/ML ~~LOC~~ SOLN
15.0000 [IU] | Freq: Every day | SUBCUTANEOUS | Status: DC
  Filled 2019-06-17: qty 0.15

## 2019-06-17 MED ORDER — ONDANSETRON HCL 4 MG/2ML IJ SOLN
INTRAMUSCULAR | Status: DC | PRN
  Administered 2019-06-17: 4 mg via INTRAVENOUS

## 2019-06-17 MED ORDER — INSULIN ASPART 100 UNIT/ML ~~LOC~~ SOLN
10.0000 [IU] | Freq: Once | SUBCUTANEOUS | Status: AC
  Administered 2019-06-17: 10 [IU] via SUBCUTANEOUS

## 2019-06-17 MED ORDER — ONDANSETRON HCL 4 MG/2ML IJ SOLN
4.0000 mg | Freq: Once | INTRAMUSCULAR | Status: AC
  Administered 2019-06-17: 4 mg via INTRAVENOUS
  Filled 2019-06-17: qty 2

## 2019-06-17 MED ORDER — SODIUM CHLORIDE (PF) 0.9 % IJ SOLN
INTRAMUSCULAR | Status: AC
  Administered 2019-06-17: 16:00:00
  Filled 2019-06-17: qty 50

## 2019-06-17 MED ORDER — PROPOFOL 10 MG/ML IV BOLUS
INTRAVENOUS | Status: DC | PRN
  Administered 2019-06-17: 160 mg via INTRAVENOUS
  Administered 2019-06-17: 40 mg via INTRAVENOUS

## 2019-06-17 MED ORDER — PHENYLEPHRINE 40 MCG/ML (10ML) SYRINGE FOR IV PUSH (FOR BLOOD PRESSURE SUPPORT)
PREFILLED_SYRINGE | INTRAVENOUS | Status: DC | PRN
  Administered 2019-06-17 (×3): 80 ug via INTRAVENOUS
  Administered 2019-06-17: 120 ug via INTRAVENOUS

## 2019-06-17 MED ORDER — SUCCINYLCHOLINE CHLORIDE 200 MG/10ML IV SOSY
PREFILLED_SYRINGE | INTRAVENOUS | Status: DC | PRN
  Administered 2019-06-17: 120 mg via INTRAVENOUS

## 2019-06-17 MED ORDER — ONDANSETRON HCL 4 MG/2ML IJ SOLN: 4.0000 mg | Freq: Four times a day (QID) | INTRAMUSCULAR | Status: DC | PRN

## 2019-06-17 MED ORDER — PROPOFOL 10 MG/ML IV BOLUS
INTRAVENOUS | Status: AC
  Filled 2019-06-17: qty 20

## 2019-06-17 MED ORDER — CIPROFLOXACIN IN D5W 400 MG/200ML IV SOLN
400.0000 mg | Freq: Once | INTRAVENOUS | Status: AC
  Administered 2019-06-17: 400 mg via INTRAVENOUS
  Filled 2019-06-17: qty 200

## 2019-06-17 MED ORDER — INSULIN ASPART 100 UNIT/ML ~~LOC~~ SOLN
10.0000 [IU] | Freq: Once | SUBCUTANEOUS | Status: AC
  Administered 2019-06-17: 10 [IU] via SUBCUTANEOUS
  Filled 2019-06-17: qty 0.1

## 2019-06-17 SURGICAL SUPPLY — 20 items
BAG URINE DRAIN 2000ML AR STRL (UROLOGICAL SUPPLIES) ×3 IMPLANT
BAG URO CATCHER STRL LF (MISCELLANEOUS) ×3 IMPLANT
CATH FOLEY 3WAY 30CC 24FR (CATHETERS) ×2
CATH HEMA 3WAY 30CC 22FR COUDE (CATHETERS) IMPLANT
CATH URTH STD 24FR FL 3W 2 (CATHETERS) ×1 IMPLANT
ELECT REM PT RETURN 15FT ADLT (MISCELLANEOUS) ×3 IMPLANT
GLOVE BIOGEL M STRL SZ7.5 (GLOVE) ×3 IMPLANT
GOWN STRL REUS W/TWL LRG LVL3 (GOWN DISPOSABLE) ×3 IMPLANT
GUIDEWIRE STR DUAL SENSOR (WIRE) ×3 IMPLANT
HOLDER FOLEY CATH W/STRAP (MISCELLANEOUS) ×3 IMPLANT
KIT TURNOVER KIT A (KITS) ×3 IMPLANT
LOOP CUT BIPOLAR 24F LRG (ELECTROSURGICAL) ×3 IMPLANT
MANIFOLD NEPTUNE II (INSTRUMENTS) ×3 IMPLANT
PACK CYSTO (CUSTOM PROCEDURE TRAY) ×3 IMPLANT
PENCIL SMOKE EVACUATOR (MISCELLANEOUS) IMPLANT
SYR 50ML LL SCALE MARK (SYRINGE) ×3 IMPLANT
SYRINGE IRR TOOMEY STRL 70CC (SYRINGE) ×3 IMPLANT
TUBING CONNECTING 10 (TUBING) ×2 IMPLANT
TUBING CONNECTING 10' (TUBING) ×1
TUBING UROLOGY SET (TUBING) ×3 IMPLANT

## 2019-06-17 NOTE — H&P (Signed)
History and Physical    Willie Hughes ZOX:[id number] DOB: 1961-07-10 DOA: 06/17/2019  PCP: Patient, No Pcp Per   Patient coming from: Home  Chief Complaint: Nausea vomiting and urinary frequency  HPI: Dia Willie Hughes is a 58 y.o. male with medical history significant for but not limited to noncompliance with medications, acne, allergic rhinitis, allergies, bipolar disorder, COPD, ED, heart disease, hypothyroidism, hyperlipidemia, type 2 diabetes mellitus was uncontrolled as well as other comorbidities who presents with chief complaint of feeling unwell and malaise and had some nausea vomiting for last 18 hours.  He states that he is I will keep anything down I thought he had food poisoning.  Had significant muscle nausea vomiting last night continue to have nausea and and slight abdominal pain.  No fevers but also just felt generally unwell.  Denies any burning or discomfort in his urination but admits having increased urinary frequency.  Patient denies any chest pain, lightheadedness or dizziness but just in general does not feel well and states that he has been chronically ill for last year or so with concern for autoimmune disease.  Was worked up in the emergency room found to have a prostate and seminal vesicle abscess.  [facility] was called to admit this patient and urology was consulted and will take the patient straight to the OR.  Emergency Room Course -In the the emergency room is given 2 L of IV fluid, basic blood work as well as a CT abdomen pelvis was done.  Rapid Covid testing was negative.  Patient continued to be hyperglycemic.  He was given IV ceftriaxone and I changed to IV ciprofloxacin and urology added gentamicin  Review of Systems: As per HPI otherwise all other systems reviewed and negative.   Past Medical History:  Diagnosis Date   Acne    Allergic rhinitis    Allergy    Bipolar disorder (HCC)    managed by Psych- Dr. Michae Kava   COPD (chronic obstructive pulmonary disease) [facility])     Erectile dysfunction    Frequent headaches    Heart disease    Heart murmur    Hyperlipidemia    Hypothyroidism    Pulmonary nodule 02/2018   Found on CT screen, Follow up due 02/2019   Type 2 diabetes mellitus (HCC)    managed by Dr. Elvera Lennox.   Past Surgical History:  Procedure Laterality Date   WISDOM TOOTH EXTRACTION     SOCIAL HISTORY  reports that he has quit smoking. His smoking use included cigarettes. He has a 70.00 pack-year smoking history. He has never used smokeless tobacco. He reports current alcohol use. He reports previous drug use. Drug: Marijuana.  Allergies  Allergen Reactions   Kava Kava     Rash    Aluminum Rash    Aluminum compounds in antiperspirant.    Other Rash    Some antiperspirants cause a rash   Family History  Problem Relation Age of Onset   Heart attack Father    Stroke Father    Mental illness Father    Heart disease Father    Drug abuse Father    Depression Father    Alcohol abuse Father    Arthritis Father    Stroke Mother    Diabetes Mother    Depression Mother    Arthritis Mother    Mental illness Mother    Depression Sister    Lupus Sister    Bipolar disorder Paternal Aunt    Lupus Sister    Lupus  Brother    Prior to Admission medications   Medication Sig Start Date End Date Taking? Authorizing Provider  acetaminophen (TYLENOL) 325 MG tablet Take 650 mg by mouth every 6 (six) hours as needed.   Yes [provider]  aspirin 325 MG tablet Take 325 mg by mouth every 6 (six) hours as needed. Pt is taking 325mg  TID   Yes [provider]  clindamycin (CLEOCIN T) 1 % external solution Apply topically 2 (two) times daily. 10/24/18  Yes Kuneff, Renee A, DO  fexofenadine (ALLEGRA) 180 MG tablet Take 180 mg by mouth daily.    Yes [provider]  Multiple Vitamin (MULTIVITAMIN) tablet Take 1 tablet by mouth daily.   Yes [provider]  Omega-3 Fatty Acids (EQL FISH OIL  PO) Take 1,000 mg by mouth 3 (three) times daily.    Yes [provider]  vitamin B-12 (CYANOCOBALAMIN) 500 MCG tablet Take 500 mcg by mouth daily.   Yes [provider]  vitamin C (ASCORBIC ACID) 500 MG tablet Take 500 mg by mouth daily.   Yes [provider]  albuterol (PROVENTIL HFA;VENTOLIN HFA) 108 (90 Base) MCG/ACT inhaler Inhale 2 puffs into the lungs every 4 (four) hours as needed for wheezing or shortness of breath. Patient not taking: Reported on 10/23/2018 02/02/17   Pleas Koch, NP  cephALEXin (KEFLEX) 500 MG capsule Take 1 capsule (500 mg total) by mouth 3 (three) times daily for 7 days. 06/17/19 06/24/19  Lucrezia Starch, MD  doxycycline (VIBRA-TABS) 100 MG tablet Take 1 tablet every 12 hours for 10 days, and then 1 tablet daily. Patient not taking: Reported on 06/17/2019 10/24/18   Howard Pouch A, DO  famotidine (PEPCID) 20 MG tablet Take 1 tablet (20 mg total) by mouth 2 (two) times daily. Patient not taking: Reported on 06/17/2019 07/10/18   Howard Pouch A, DO  Fenofibrate 150 MG CAPS Take 1 capsule (150 mg total) by mouth daily. Patient not taking: Reported on 06/17/2019 03/08/18   Howard Pouch A, DO  glucose blood (ONE TOUCH TEST STRIPS) test strip Use as instructed to test blood sugar 3 times daily 07/31/18   Philemon Kingdom, MD  insulin aspart (NOVOLOG FLEXPEN) 100 UNIT/ML FlexPen Inject 15-18 Units into the skin 3 (three) times daily with meals. Patient not taking: Reported on 06/17/2019 09/03/18   Philemon Kingdom, MD  Insulin Degludec (TRESIBA FLEXTOUCH) 200 UNIT/ML SOPN Inject 44 Units into the skin daily. Patient not taking: Reported on 06/17/2019 09/09/18   Philemon Kingdom, MD  Insulin Syringe-Needle U-100 (RELION INSULIN SYRINGE 1ML/31G) 31G X 5/16" 1 ML MISC Use with insulin twice daily as directed. 07/11/17   Pleas Koch, NP  levothyroxine (SYNTHROID, LEVOTHROID) 75 MCG tablet TAKE ONE TABLET BY MOUTH EVERY MORNING ON AN EMPTY  STOMACH WITH A FULL GLASS OF WATER Patient not taking: Reported on 06/17/2019 10/24/18   Raoul Pitch, Renee A, DO  lithium carbonate 300 MG capsule Take 3 capsules (900 mg total) by mouth daily. Patient not taking: Reported on 06/17/2019 10/23/18   Charlcie Cradle, MD  ondansetron (ZOFRAN ODT) 4 MG disintegrating tablet Take 1 tablet (4 mg total) by mouth every 8 (eight) hours as needed for nausea or vomiting. 06/17/19   Lucrezia Starch, MD  [facility] DELICA LANCETS 70J MISC Use as instructed to test blood sugar 3 times daily 07/31/18   Philemon Kingdom, MD  sildenafil (VIAGRA) 100 MG tablet Take 1 tablet (100 mg total) by mouth daily as needed for  erectile dysfunction. Patient not taking: Reported on 06/17/2019 03/08/18   Felix PaciniKuneff, Renee A, DO  simvastatin (ZOCOR) 40 MG tablet Take 1 tablet (40 mg total) by mouth at bedtime. Patient not taking: Reported on 06/17/2019 03/08/18   Felix PaciniKuneff, Renee A, DO  tretinoin (RETIN-A) 0.05 % cream APPLY  CREAM TOPICALLY TO AFFECTED AREA AS NEEDED Patient not taking: Reported on 06/17/2019 12/24/17   Natalia LeatherwoodKuneff, Renee A, DO   Physical Exam: Vitals:   06/17/19 1834 06/17/19 1900 06/17/19 1930 06/17/19 1939  BP: (!) 161/94 (!) 147/82 (!) 117/96 (!) 117/96  Pulse: 94 95 99 94  Resp: 18 18 18 18   Temp:      TempSrc:      SpO2: 99% 95% 96% 95%  Weight:      Height:       Constitutional: Fatigued Caucasian male currently appears uncomfortable and ill-appearing Eyes: Llids and conjunctivae normal, sclerae anicteric  ENMT: External Ears, Nose appear normal. Grossly normal hearing.  Had some facial acne Neck: Appears normal, supple, no cervical masses, normal ROM, no appreciable thyromegaly Respiratory: Diminished to auscultation bilaterally, no wheezing, rales, rhonchi or crackles. Normal respiratory effort and patient is not tachypenic. No accessory muscle use.  Unlabored breathing Cardiovascular: RRR, has a cardiac murmur Abdomen: Soft, non-tender, non-distended. Bowel sounds  positive x4.  GU: Deferred. Musculoskeletal: No clubbing / cyanosis of digits/nails. No joint deformity upper and lower extremities.  Skin: Has a facial acne/rosacea on his nose as well as on his ear. No induration; Warm and dry.  Neurologic: CN 2-12 grossly intact with no focal deficits.  Romberg sign cerebellar reflexes not assessed.  Psychiatric: Normal judgment and insight. Alert and oriented x 3. Depressed mood and appropriate affect.   Labs on Admission: I have personally reviewed following labs and imaging studies  CBC: Recent Labs  Lab 06/17/19 1357  WBC 25.0*  NEUTROABS 22.9*  HGB 14.6  HCT 44.3  MCV 91.2  PLT 274   Basic Metabolic Panel: Recent Labs  Lab 06/17/19 1357  NA 132*  K 4.1  CL 95*  CO2 22  GLUCOSE 482*  BUN 13  CREATININE 0.73  CALCIUM 9.0   GFR: Estimated Creatinine Clearance: 120.2 mL/min (by C-G formula based on SCr of 0.73 mg/dL). Liver Function Tests: No results for input(s): AST, ALT, ALKPHOS, BILITOT, PROT, ALBUMIN in the last 168 hours. No results for input(s): LIPASE, AMYLASE in the last 168 hours. No results for input(s): AMMONIA in the last 168 hours. Coagulation Profile: No results for input(s): INR, PROTIME in the last 168 hours. Cardiac Enzymes: No results for input(s): CKTOTAL, CKMB, CKMBINDEX, TROPONINI in the last 168 hours. BNP (last 3 results) No results for input(s): PROBNP in the last 8760 hours. HbA1C: No results for input(s): HGBA1C in the last 72 hours. CBG: Recent Labs  Lab 06/17/19 1317 06/17/19 1753  GLUCAP 470* 453*   Lipid Profile: No results for input(s): CHOL, HDL, LDLCALC, TRIG, CHOLHDL, LDLDIRECT in the last 72 hours. Thyroid Function Tests: No results for input(s): TSH, T4TOTAL, FREET4, T3FREE, THYROIDAB in the last 72 hours. Anemia Panel: No results for input(s): VITAMINB12, FOLATE, FERRITIN, TIBC, IRON, RETICCTPCT in the last 72 hours. Urine analysis:    Component Value Date/Time   COLORURINE STRAW  (A) 06/17/2019 1357   APPEARANCEUR HAZY (A) 06/17/2019 1357   LABSPEC 1.028 06/17/2019 1357   PHURINE 7.0 06/17/2019 1357   GLUCOSEU >=500 (A) 06/17/2019 1357   HGBUR NEGATIVE 06/17/2019 1357   BILIRUBINUR NEGATIVE 06/17/2019 1357  BILIRUBINUR neg 11/01/2017 1544   KETONESUR 20 (A) 06/17/2019 1357   PROTEINUR NEGATIVE 06/17/2019 1357   UROBILINOGEN 0.2 11/01/2017 1544   NITRITE NEGATIVE 06/17/2019 1357   LEUKOCYTESUR MODERATE (A) 06/17/2019 1357   Sepsis Labs: !!!!!!!!!!!!!!!!!!!!!!!!!!!!!!!!!!!!!!!!!!!! @LABRCNTIP (procalcitonin:4,lacticidven:4) ) Recent Results (from the past 240 hour(s))  SARS Coronavirus 2 by RT PCR (hospital order, performed in [facility] hospital lab) Nasopharyngeal Nasopharyngeal Swab     Status: None   Collection Time: 06/17/19  5:55 PM   Specimen: Nasopharyngeal Swab  Result Value Ref Range Status   SARS Coronavirus 2 NEGATIVE NEGATIVE Final    Comment: (NOTE) SARS-CoV-2 target nucleic acids are NOT DETECTED. The SARS-CoV-2 RNA is generally detectable in upper and lower respiratory specimens during the acute phase of infection. The lowest concentration of SARS-CoV-2 viral copies this assay can detect is 250 copies / mL. A negative result does not preclude SARS-CoV-2 infection and should not be used as the sole basis for treatment or other patient management decisions.  A negative result may occur with improper specimen collection / handling, submission of specimen other than nasopharyngeal swab, presence of viral mutation(s) within the areas targeted by this assay, and inadequate number of viral copies (<250 copies / mL). A negative result must be combined with clinical observations, patient history, and epidemiological information. Fact Sheet for Patients:   06/19/19 Fact Sheet for Healthcare Providers: BoilerBrush.com.cy This test is not yet approved or cleared  by the https://pope.com/ FDA  and has been authorized for detection and/or diagnosis of SARS-CoV-2 by FDA under an Emergency Use Authorization (EUA).  This EUA will remain in effect (meaning this test can be used) for the duration of the COVID-19 declaration under Section 564(b)(1) of the Act, 21 U.S.C. section 360bbb-3(b)(1), unless the authorization is terminated or revoked sooner. Performed at [facility], [address], [place], [place]      Radiological Exams on Admission: Ct Abdomen Pelvis W Contrast  Result Date: 06/17/2019 CLINICAL DATA:  58 year old male with history of acute generalized abdominal pain and vomiting. EXAM: CT ABDOMEN AND PELVIS WITH CONTRAST TECHNIQUE: Multidetector CT imaging of the abdomen and pelvis was performed using the standard protocol following bolus administration of intravenous contrast. CONTRAST:  41 OMNIPAQUE IOHEXOL 300 MG/ML  SOLN COMPARISON:  CT the abdomen and pelvis 12/20/2018. FINDINGS: Lower chest: Unremarkable. Hepatobiliary: No suspicious cystic or solid hepatic lesions. No intra or extrahepatic biliary ductal dilatation. Gallbladder is normal in appearance. Pancreas: No pancreatic mass. No pancreatic ductal dilatation. No pancreatic or peripancreatic fluid collections or inflammatory changes. Spleen: Unremarkable. Adrenals/Urinary Tract: Bilateral kidneys and bilateral adrenal glands are normal in appearance. No hydroureteronephrosis. Urinary bladder is normal in appearance. Stomach/Bowel: Normal appearance of the stomach. No pathologic dilatation of small bowel or colon. Normal appendix. Vascular/Lymphatic: Aortic atherosclerosis, without evidence of aneurysm or dissection in the abdominal or pelvic vasculature. No lymphadenopathy noted in the abdomen or pelvis. Reproductive: In the right-side of the prostate gland there is a well-defined 2.6 x 2.0 x 2.8 cm low-attenuation lesion (axial image 87 of series 3 and coronal image 85 of series 4),  concerning for a prosthetic abscess. There is also asymmetric enlargement of the right seminal vesicles with subtle surrounding inflammatory changes. Other: No significant volume of ascites.  No pneumoperitoneum. Musculoskeletal: There are no aggressive appearing lytic or blastic lesions noted in the visualized portions of the skeleton. IMPRESSION: 1. Findings are concerning for probable right-sided prostate abscess with involvement of the right seminal vesicle (either  seminal vesiculitis or seminal vesicle abscess). Urologic consultation is recommended. 2. Aortic atherosclerosis. Electronically Signed   By: Trudie Reed M.D.   On: 06/17/2019 16:45   EKG: No EKG done on admission  Assessment/Plan Active Problems:   Hyperlipidemia   Bipolar disorder (HCC)   Hypothyroidism   Chronic allergic rhinitis   Uncontrolled type 2 diabetes mellitus with peripheral neuropathy (HCC)   Prostate abscess  Prostate and seminal vesicle abscess associated with urinary tract infection and associated Leukocytosis  -Patient was not septic on admission as he was not febrile, tachycardic, or tachypenic -CT Scan showed "Findings are concerning for probable right-sided prostate abscess with involvement of the right seminal vesicle (either seminal vesiculitis or seminal vesicle abscess)" -U/A Showed Hazy Appearance, Straw Color Urine, >=500 Glucose, 20 Ketones, Moderate Leukocytes  -WBC was 25,000 -Check Blood cultures x2 as well as urine culture -Had Nausea and Vomiting -C/w Supportive Care, Fluids and Antiemetics as well as Acetaminophen -Urology consulted and patient going to Surgery -Given 2 Liter boluses in the ED and was started on NS at 125 mL/hr -Given IV Ceftriaxone 2 grams and will change to Ciprofloxacin and Gentamicin  -Urology consulted for further evaluation and recommendations and patient going to the OR tonight   Hyponatremia -Patient's Na+ was 132 and in the Setting of Hyperglycemia  -Given 2  Liters of NS and started on NS at 125 mL/hr -Continue to Monitor and Trend -Repeat CMP in AM    Uncontrolled Hyperglycemia with Concern for going into early DKA -Patient's Blood Sugar on Admission was 482 -CBG was 453; States he is not taking his Novolog Flexpen or [place] Flextouch Pen due to inability to afford his medications -Last HbA1c was 9.1 back in March -Check HbA1c -Start Lantus 15 units and Moderate Novolog SSI AC/HS -Continue to Monitor and adjust as Necessary -Ketones were positive on UA and VBG showed pCO2 of 40.9, pO2 of 52.8 and pH of 7.427 -Check Beta-Hydroxybutyric Acid -Will hold off of starting Insulin gtt unless needed -Consult Diabetes Education Coordinator   Recent COVID 19 Diasease -Tested Negative here -Check CXR in AM  -Check Inflammatory Markers but likely elevated in the setting of Prostate Abscsess   Hypothyroidism -Check TSH; Last TSH was 5.49 -Not taking Levothyroxine but will resume 75 mcg and may need to adjust   HLD -Not taking his Zocor anymore -Will need to Repeat Lipid Panel in AM  -C/w Omega 3 Fatty Acids  Bipolar Disorder -Not taking his Lithium 900 mg po Daily -Will check Lithium Level and resume Lithium Dosing in the AM  Allergic Rhinitis -C/w Fexofenadine 180 mg po Daily substitution  COPD -Not taking his Albuterol inhaler but will resume\  Facial Acne -C/w Clindamycin Topically   DVT prophylaxis: Heparin 5,000 units sq q8h Code Status: FULL CODE but patient does not want resuscitation past 3 minutes  Family Communication: No family present  Disposition Plan: Admit for Inpatient  Consults called: Urology Admission status: Inpatient SDU  Severity of Illness: The appropriate patient status for this patient is INPATIENT. Inpatient status is judged to be reasonable and necessary in order to provide the required intensity of service to ensure the patient's safety. The patient's presenting symptoms, physical exam findings, and  initial radiographic and laboratory data in the context of their chronic comorbidities is felt to place them at high risk for further clinical deterioration. Furthermore, it is not anticipated that the patient will be medically stable for discharge from the hospital within 2 midnights of admission.  The following factors support the patient status of inpatient.   " The patient's presenting symptoms include Malaise, Fatigue, Nausea and Vomiting. " The worrisome physical exam findings include Dry MM. " The initial radiographic and laboratory data are worrisome because of Leukocytosis . " The chronic co-morbidities are listed as above  * I certify that at the point of admission it is my clinical judgment that the patient will require inpatient hospital care spanning beyond 2 midnights from the point of admission due to high intensity of service, high risk for further deterioration and high frequency of surveillance required.Merlene Laughter, D.O. [facility] is on AMION  If 7PM-7AM, please contact night-coverage [email or web address]  06/17/2019, 8:01 PM

## 2019-06-17 NOTE — Anesthesia Preprocedure Evaluation (Addendum)
Anesthesia Evaluation  Patient identified by MRN, date of birth, ID band Patient awake    Reviewed: Allergy & Precautions, NPO status , Patient's Chart, lab work & pertinent test results  History of Anesthesia Complications Negative for: history of anesthetic complications  Airway Mallampati: III  TM Distance: >3 FB Neck ROM: Full    Dental  (+) Dental Advisory Given, Poor Dentition   Pulmonary COPD, former smoker,    Pulmonary exam normal        Cardiovascular negative cardio ROS Normal cardiovascular exam     Neuro/Psych  Headaches, PSYCHIATRIC DISORDERS Bipolar Disorder    GI/Hepatic Neg liver ROS,  Nausea, vomiting in ED    Endo/Other  diabetes, Poorly Controlled, Type 2, Insulin DependentHypothyroidism  Hyponatremia   Renal/GU negative Renal ROS    Prostate abscess     Musculoskeletal  (+) Arthritis ,   Abdominal   Peds  Hematology  Has been taking 650mg  aspirin up to 4 times/day    Anesthesia Other Findings   Reproductive/Obstetrics                            Anesthesia Physical Anesthesia Plan  ASA: III and emergent  Anesthesia Plan: General   Post-op Pain Management:    Induction: Intravenous, Rapid sequence and Cricoid pressure planned  PONV Risk Score and Plan: 4 or greater and Treatment may vary due to age or medical condition, Ondansetron, Midazolam and Dexamethasone  Airway Management Planned: Oral ETT  Additional Equipment: None  Intra-op Plan:   Post-operative Plan: Extubation in OR  Informed Consent: I have reviewed the patients History and Physical, chart, labs and discussed the procedure including the risks, benefits and alternatives for the proposed anesthesia with the patient or authorized representative who has indicated his/her understanding and acceptance.     Dental advisory given  Plan Discussed with: CRNA and  Anesthesiologist  Anesthesia Plan Comments:        Anesthesia Quick Evaluation

## 2019-06-17 NOTE — Transfer of Care (Signed)
Immediate Anesthesia Transfer of Care Note  Patient: Willie Hughes  Procedure(s) Performed: TRANSURETHRAL RESECTION OF THE PROSTATE (TURP) (N/A )  Patient Location: PACU  Anesthesia Type:General  Level of Consciousness: awake, alert  and patient cooperative  Airway & Oxygen Therapy: Patient Spontanous Breathing and Patient connected to face mask oxygen  Post-op Assessment: Report given to RN and Post -op Vital signs reviewed and stable  Post vital signs: Reviewed and stable  Last Vitals:  Vitals Value Taken Time  BP 107/60 06/17/19 2228  Temp    Pulse 90 06/17/19 2229  Resp 18 06/17/19 2230  SpO2 98 % 06/17/19 2229  Vitals shown include unvalidated device data.  Last Pain:  Vitals:   06/17/19 2000  TempSrc:   PainSc: 0-No pain         Complications: No apparent anesthesia complications

## 2019-06-17 NOTE — Consult Note (Signed)
Reason for Consult: Large Prostate Abscess  Referring Physician: Adolph Pollack MD  Willie Hughes is an 58 y.o. male.   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 Abscess CX 11/24 penidng. WBC 25k, Cr <1.   PMH sig for bipolar (admission for prior manic episodes), IDDM2 (A1c 9s and large glycosuria, cant afford insulin), 70PY smoker. NO ischemic CV disease / strong blood thinners. He is on daily ASA.   Today "Willie Hughes" is seen for urgent consult for above.   Past Medical History:  Diagnosis Date  . Acne   . Allergic rhinitis   . Allergy   . Bipolar disorder (HCC)    managed by Psych- Dr. Michae Kava  . COPD (chronic obstructive pulmonary disease) (HCC)   . Erectile dysfunction   . Frequent headaches   . Heart disease   . Heart murmur   . Hyperlipidemia   . Hypothyroidism   . Pulmonary nodule 02/2018   Found on CT screen, Follow up due 02/2019  . Type 2 diabetes mellitus (HCC)    managed by Dr. Elvera Lennox.    Past Surgical History:  Procedure Laterality Date  . WISDOM TOOTH EXTRACTION      Family History  Problem Relation Age of Onset  . Heart attack Father   . Stroke Father   . Mental illness Father   . Heart disease Father   . Drug abuse Father   . Depression Father   . Alcohol abuse Father   . Arthritis Father   . Stroke Mother   . Diabetes Mother   . Depression Mother   . Arthritis Mother   . Mental illness Mother   . Depression Sister   . Lupus Sister   . Bipolar disorder Paternal Aunt   . Lupus Sister   . Lupus Brother     Social History:  reports that he has quit smoking. His smoking use included cigarettes. He has a 70.00 pack-year smoking history. He has never used smokeless tobacco. He reports current alcohol use. He reports previous drug use. Drug: Marijuana.  Allergies:  Allergies  Allergen  Reactions  . Kava Kava     Rash   . Aluminum Rash    Aluminum compounds in antiperspirant.   . Other Rash    Some antiperspirants cause a rash    Medications: I have reviewed the patient's current medications.  Results for orders placed or performed during the hospital encounter of 06/17/19 (from the past 48 hour(s))  CBG monitoring, ED     Status: Abnormal   Collection Time: 06/17/19  1:17 PM  Result Value Ref Range   Glucose-Capillary 470 (H) 70 - 99 mg/dL  Basic metabolic panel     Status: Abnormal   Collection Time: 06/17/19  1:57 PM  Result Value Ref Range   Sodium 132 (L) 135 - 145 mmol/L   Potassium 4.1 3.5 - 5.1 mmol/L   Chloride 95 (L) 98 - 111 mmol/L   CO2 22 22 - 32 mmol/L   Glucose, Bld 482 (H) 70 - 99 mg/dL   BUN 13 6 - 20 mg/dL   Creatinine, Ser 5.95 0.61 - 1.24 mg/dL   Calcium 9.0 8.9 - 63.8 mg/dL   GFR calc non Af Amer >60 >60 mL/min   GFR calc Af Amer >60 >60 mL/min   Anion gap 15 5 - 15  Comment: Performed at [facility], [address] [place], [place] [address]  CBC     Status: Abnormal   Collection Time: 06/17/19  1:57 PM  Result Value Ref Range   WBC 25.0 (H) 4.0 - 10.5 K/uL   RBC 4.86 4.22 - 5.81 MIL/uL   Hemoglobin 14.6 13.0 - 17.0 g/dL   HCT 91.444.3 78.239.0 - 95.652.0 %   MCV 91.2 80.0 - 100.0 fL   MCH 30.0 26.0 - 34.0 pg   MCHC 33.0 30.0 - 36.0 g/dL   RDW 21.312.2 08.611.5 - 57.815.5 %   Platelets 274 150 - 400 K/uL   nRBC 0.0 0.0 - 0.2 %    Comment: Performed at [facility], [address] [place], [place] [address]  Urinalysis, Routine w reflex microscopic     Status: Abnormal   Collection Time: 06/17/19  1:57 PM  Result Value Ref Range   Color, Urine STRAW (A) YELLOW   APPearance HAZY (A) CLEAR   Specific Gravity, Urine 1.028 1.005 - 1.030   pH 7.0 5.0 - 8.0   Glucose, UA >=500 (A) NEGATIVE mg/dL   Hgb urine dipstick NEGATIVE NEGATIVE   Bilirubin Urine NEGATIVE NEGATIVE   Ketones, ur 20 (A) NEGATIVE mg/dL    Protein, ur NEGATIVE NEGATIVE mg/dL   Nitrite NEGATIVE NEGATIVE   Leukocytes,Ua MODERATE (A) NEGATIVE   RBC / HPF 0-5 0 - 5 RBC/hpf   WBC, UA >50 (H) 0 - 5 WBC/hpf   Bacteria, UA RARE (A) NONE SEEN    Comment: Performed at [facility], [address] [place], [place] [address]  Blood gas, venous (at [facility], not at [facility])     Status: Abnormal   Collection Time: 06/17/19  1:57 PM  Result Value Ref Range   pH, Ven 7.427 7.250 - 7.430   pCO2, Ven 40.9 (L) 44.0 - 60.0 mmHg   pO2, Ven 52.8 (H) 32.0 - 45.0 mmHg   Bicarbonate 26.5 20.0 - 28.0 mmol/L   Acid-Base Excess 2.4 (H) 0.0 - 2.0 mmol/L   O2 Saturation 87.5 %   Patient temperature 98.6     Comment: Performed at [facility], [address] [place], [place] [address]  Differential     Status: Abnormal   Collection Time: 06/17/19  1:57 PM  Result Value Ref Range   Neutrophils Relative % 91 %   Neutro Abs 22.9 (H) 1.7 - 7.7 K/uL   Lymphocytes Relative 2 %   Lymphs Abs 0.6 (L) 0.7 - 4.0 K/uL   Monocytes Relative 6 %   Monocytes Absolute 1.4 (H) 0.1 - 1.0 K/uL   Eosinophils Relative 0 %   Eosinophils Absolute 0.0 0.0 - 0.5 K/uL   Basophils Relative 0 %   Basophils Absolute 0.1 0.0 - 0.1 K/uL   Immature Granulocytes 1 %   Abs Immature Granulocytes 0.29 (H) 0.00 - 0.07 K/uL    Comment: Performed at [facility], [address] [place], [place] [address]  CBG monitoring, ED     Status: Abnormal   Collection Time: 06/17/19  5:53 PM  Result Value Ref Range   Glucose-Capillary 453 (H) 70 - 99 mg/dL    Ct Abdomen Pelvis W Contrast  Result Date: 06/17/2019 CLINICAL DATA:  58 year old male with history of acute generalized abdominal pain and vomiting. EXAM: CT ABDOMEN AND PELVIS WITH CONTRAST TECHNIQUE: Multidetector CT imaging of the abdomen and pelvis was performed using the standard protocol following  bolus administration of intravenous contrast. CONTRAST:  175mL OMNIPAQUE  IOHEXOL 300 MG/ML  SOLN COMPARISON:  CT the abdomen and pelvis 12/20/2018. FINDINGS: Lower chest: Unremarkable. Hepatobiliary: No suspicious cystic or solid hepatic lesions. No intra or extrahepatic biliary ductal dilatation. Gallbladder is normal in appearance. Pancreas: No pancreatic mass. No pancreatic ductal dilatation. No pancreatic or peripancreatic fluid collections or inflammatory changes. Spleen: Unremarkable. Adrenals/Urinary Tract: Bilateral kidneys and bilateral adrenal glands are normal in appearance. No hydroureteronephrosis. Urinary bladder is normal in appearance. Stomach/Bowel: Normal appearance of the stomach. No pathologic dilatation of small bowel or colon. Normal appendix. Vascular/Lymphatic: Aortic atherosclerosis, without evidence of aneurysm or dissection in the abdominal or pelvic vasculature. No lymphadenopathy noted in the abdomen or pelvis. Reproductive: In the right-side of the prostate gland there is a well-defined 2.6 x 2.0 x 2.8 cm low-attenuation lesion (axial image 87 of series 3 and coronal image 85 of series 4), concerning for a prosthetic abscess. There is also asymmetric enlargement of the right seminal vesicles with subtle surrounding inflammatory changes. Other: No significant volume of ascites.  No pneumoperitoneum. Musculoskeletal: There are no aggressive appearing lytic or blastic lesions noted in the visualized portions of the skeleton. IMPRESSION: 1. Findings are concerning for probable right-sided prostate abscess with involvement of the right seminal vesicle (either seminal vesiculitis or seminal vesicle abscess). Urologic consultation is recommended. 2. Aortic atherosclerosis. Electronically Signed   By: Vinnie Langton M.D.   On: 06/17/2019 16:45    Review of Systems  Constitutional: Positive for malaise/fatigue.  HENT: Negative.   Eyes: Negative.   Respiratory: Negative.   Cardiovascular: Negative.   Gastrointestinal: Negative.   Genitourinary: Positive  for frequency and urgency.  Musculoskeletal: Negative.   Neurological: Negative.   Endo/Heme/Allergies: Negative.   Psychiatric/Behavioral: Negative.    Blood pressure 138/81, pulse 91, temperature 98.5 F (36.9 C), temperature source Oral, resp. rate 17, height 6' 3.75" (1.924 m), weight 84.4 kg, SpO2 97 %. Physical Exam  Constitutional: He appears well-developed.  stigmata of controlled psychiatric disease. Cooperative and AOx3.   HENT:  Head: Normocephalic.  Eyes: Pupils are equal, round, and reactive to light.  Neck: Normal range of motion.  Cardiovascular: Normal rate.  Respiratory: Effort normal.  GI: Soft.  Genitourinary:    Genitourinary Comments: SP TTP   Musculoskeletal: Normal range of motion.  Neurological: He is alert.  Skin: Skin is warm.  Psychiatric: He has a normal mood and affect.    Assessment/Plan:  1 - Large Prostate Abscess / Severe GU Infection - rec operative TUR unroofing today and then foley placement, IV ABX, Medical optimization diabetes, remain in house until at least prelim CX data and afebrile x 24 hours.   Rec double coverage with Gent in addition to rocephin for maximal prostate penetration.   Alexis Frock 06/17/2019, 5:55 PM

## 2019-06-17 NOTE — Progress Notes (Signed)
Pharmacy Antibiotic Note  Willie Hughes is a 58 y.o. male presented to the ED on 06/17/2019 with c/o n/v.  Abdominal CT showed findings with concern for a large right-sided prostate abscess.  Urologist recom. to treat with gentamicin and ciprofloxacin for double coverage and for TURP.   Plan: - cipro 400 mg IV q12h - gentamicin 7 mg/kg/day - will check 10 hr level for gentamicin  _______________________________________  Height: 6' 3.75" (192.4 cm) Weight: 186 lb (84.4 kg) IBW/kg (Calculated) : 86.23  Temp (24hrs), Avg:98.5 F (36.9 C), Min:98.5 F (36.9 C), Max:98.5 F (36.9 C)  Recent Labs  Lab 06/17/19 1357  WBC 25.0*  CREATININE 0.73    Estimated Creatinine Clearance: 120.2 mL/min (by C-G formula based on SCr of 0.73 mg/dL).    Allergies  Allergen Reactions  . Kava Kava     Rash   . Aluminum Rash    Aluminum compounds in antiperspirant.   . Other Rash    Some antiperspirants cause a rash    Thank you for allowing pharmacy to be a part of this patient's care.  Lynelle Doctor 06/17/2019 6:26 PM

## 2019-06-17 NOTE — ED Notes (Signed)
Patient had two episodes of emesis. Patient endorses nausea currently. Patient states he believes he has food poisoning.
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
## 2019-06-17 NOTE — ED Notes (Signed)
OR consent form and bedside and filled out. Patient has not signed yet. Patient would like to speak to Surgeon more before signing consent form.

## 2019-06-17 NOTE — ED Provider Notes (Signed)
[facility]-EMERGENCY DEPT Provider Note   CSN: [id number] Arrival date & time: 06/17/19  1304     History   Chief Complaint Chief Complaint  Patient presents with   Hyperglycemia    HPI Naser Schuld is a 58 y.o. male.  Presents to ER with concern for nausea, vomiting.  Patient reports yesterday evening thinks he may have had food poisoning, unsure what food may have triggered this.  Started having significant amount of nausea, vomiting last night.  Today, continued to have vomiting, diarrhea.  No associated abdominal pain.  No fever.  Vomiting is nonbloody, nonbilious.  States that sugars have been running high, around 400s, states sugars frequently run this high.  No dysuria, hematuria.    HPI  Past Medical History:  Diagnosis Date   Acne    Allergic rhinitis    Allergy    Bipolar disorder (HCC)    managed by Psych- Dr. Michae Kava   COPD (chronic obstructive pulmonary disease) [facility])    Erectile dysfunction    Frequent headaches    Heart disease    Heart murmur    Hyperlipidemia    Hypothyroidism    Pulmonary nodule 02/2018   Found on CT screen, Follow up due 02/2019   Type 2 diabetes mellitus (HCC)    managed by Dr. Elvera Lennox.    Patient Active Problem List   Diagnosis Date Noted   Decreased hearing, right 04/03/2018   Otitis externa due to Pseudomonas aeruginosa 04/03/2018   Pulmonary nodule 03/13/2018   Abnormal SPEP 02/26/2018   Uncontrolled type 2 diabetes mellitus with peripheral neuropathy (HCC) 11/20/2017   History of tobacco abuse 02/01/2017   Arthritis 12/20/2016   Chronic allergic rhinitis 09/20/2016   Chronic headaches 09/20/2016   Hypothyroidism 01/28/2016   Hyperlipidemia 12/15/2015   Erectile dysfunction 12/15/2015   Acne vulgaris 12/15/2015   Bipolar disorder (HCC) 12/15/2015    Past Surgical History:  Procedure Laterality Date   WISDOM TOOTH EXTRACTION          Home Medications    Prior to  Admission medications   Medication Sig Start Date End Date Taking? Authorizing Provider  acetaminophen (TYLENOL) 325 MG tablet Take 650 mg by mouth every 6 (six) hours as needed.    [provider]  albuterol (PROVENTIL HFA;VENTOLIN HFA) 108 (90 Base) MCG/ACT inhaler Inhale 2 puffs into the lungs every 4 (four) hours as needed for wheezing or shortness of breath. Patient not taking: Reported on 10/23/2018 02/02/17   Doreene Nest, NP  aspirin 325 MG tablet Take 325 mg by mouth every 6 (six) hours as needed. Pt is taking  TID    [provider]  clindamycin (CLEOCIN T) 1 % external solution Apply topically 2 (two) times daily. 10/24/18   Kuneff, Renee A, DO  doxycycline (VIBRA-TABS) 100 MG tablet Take 1 tablet every 12 hours for 10 days, and then 1 tablet daily. 10/24/18   Kuneff, Renee A, DO  famotidine (PEPCID) 20 MG tablet Take 1 tablet (20 mg total) by mouth 2 (two) times daily. Patient taking differently: Take 20 mg by mouth daily.  07/10/18   Kuneff, Renee A, DO  Fenofibrate 150 MG CAPS Take 1 capsule (150 mg total) by mouth daily. 03/08/18   Kuneff, Renee A, DO  fexofenadine (ALLEGRA) 180 MG tablet Take 180 mg by mouth daily.     [provider]  glucose blood (ONE TOUCH TEST STRIPS) test strip Use as instructed to test blood sugar 3 times  daily 07/31/18   Carlus Pavlov, MD  insulin aspart (NOVOLOG FLEXPEN) 100 UNIT/ML FlexPen Inject 15-18 Units into the skin 3 (three) times daily with meals. 09/03/18   Carlus Pavlov, MD  Insulin Degludec (TRESIBA FLEXTOUCH) 200 UNIT/ML SOPN Inject 44 Units into the skin daily. 09/09/18   Carlus Pavlov, MD  Insulin Syringe-Needle U-100 (RELION INSULIN SYRINGE 1ML/31G) 31G X 5/16" 1 ML MISC Use with insulin twice daily as directed. 07/11/17   Doreene Nest, NP  levothyroxine (SYNTHROID, LEVOTHROID) 75 MCG tablet TAKE ONE TABLET BY MOUTH EVERY MORNING ON AN EMPTY STOMACH WITH A FULL GLASS OF WATER 10/24/18   Kuneff, Renee  A, DO  lithium carbonate 300 MG capsule Take 3 capsules (900 mg total) by mouth daily. 10/23/18   Oletta Darter, MD  Multiple Vitamin (MULTIVITAMIN) tablet Take 1 tablet by mouth daily.    [provider]  Omega-3 Fatty Acids (EQL FISH OIL PO) Take 1,000 mg by mouth 3 (three) times daily.     [provider]  Dola Argyle LANCETS 33G MISC Use as instructed to test blood sugar 3 times daily 07/31/18   Carlus Pavlov, MD  sildenafil (VIAGRA) 100 MG tablet Take 1 tablet (100 mg total) by mouth daily as needed for erectile dysfunction. 03/08/18   Kuneff, Renee A, DO  simvastatin (ZOCOR) 40 MG tablet Take 1 tablet (40 mg total) by mouth at bedtime. 03/08/18   Kuneff, Renee A, DO  tretinoin (RETIN-A) 0.05 % cream APPLY  CREAM TOPICALLY TO AFFECTED AREA AS NEEDED 12/24/17   Kuneff, Renee A, DO  vitamin B-12 (CYANOCOBALAMIN) 500 MCG tablet Take 500 mcg by mouth daily.    [provider]  vitamin C (ASCORBIC ACID) 500 MG tablet Take 500 mg by mouth daily.    [provider]    Family History Family History  Problem Relation Age of Onset   Heart attack Father    Stroke Father    Mental illness Father    Heart disease Father    Drug abuse Father    Depression Father    Alcohol abuse Father    Arthritis Father    Stroke Mother    Diabetes Mother    Depression Mother    Arthritis Mother    Mental illness Mother    Depression Sister    Lupus Sister    Bipolar disorder Paternal Aunt    Lupus Sister    Lupus Brother     Social History Social History   Tobacco Use   Smoking status: Former Smoker    Packs/day: 2.00    Years: 35.00    Pack years: 70.00    Types: Cigarettes   Smokeless tobacco: Never Used   Tobacco comment: Vapor  Substance Use Topics   Alcohol use: Yes    Alcohol/week: 0.0 standard drinks    Comment: a few beers a month   Drug use: Not Currently    Types: Marijuana     Allergies   Kava kava and  Other   Review of Systems Review of Systems  Constitutional: Negative for chills and fever.  HENT: Negative for ear pain and sore throat.   Eyes: Negative for pain and visual disturbance.  Respiratory: Negative for cough and shortness of breath.   Cardiovascular: Negative for chest pain and palpitations.  Gastrointestinal: Positive for diarrhea, nausea and vomiting. Negative for abdominal pain.  Genitourinary: Negative for dysuria and hematuria.  Musculoskeletal: Negative for arthralgias and back pain.  Skin: Negative for color  change and rash.  Neurological: Negative for seizures and syncope.  All other systems reviewed and are negative.    Physical Exam Updated Vital Signs BP 125/60 (BP Location: Left Arm)    Pulse 83    Temp 98.5 F (36.9 C) (Oral)    Resp 16    Ht 6' 3.75" (1.924 m)    Wt 84.4 kg    SpO2 95%    BMI 22.79 kg/m   Physical Exam Vitals signs and nursing note reviewed.  Constitutional:      Appearance: He is well-developed.  HENT:     Head: Normocephalic and atraumatic.  Eyes:     Conjunctiva/sclera: Conjunctivae normal.  Neck:     Musculoskeletal: Neck supple.  Cardiovascular:     Rate and Rhythm: Normal rate and regular rhythm.     Heart sounds: No murmur.  Pulmonary:     Effort: Pulmonary effort is normal. No respiratory distress.     Breath sounds: Normal breath sounds.  Abdominal:     Palpations: Abdomen is soft.     Tenderness: There is no abdominal tenderness.  Skin:    General: Skin is warm and dry.     Capillary Refill: Capillary refill takes less than 2 seconds.  Neurological:     General: No focal deficit present.     Mental Status: He is alert and oriented to person, place, and time.  Psychiatric:        Mood and Affect: Mood normal.        Behavior: Behavior normal.      ED Treatments / Results  Labs (all labs ordered are listed, but only abnormal results are displayed) Labs Reviewed  CBG MONITORING, ED - Abnormal; Notable for  the following components:      Result Value   Glucose-Capillary 470 (*)    All other components within normal limits  BASIC METABOLIC PANEL  CBC  URINALYSIS, ROUTINE W REFLEX MICROSCOPIC  BLOOD GAS, VENOUS  CBG MONITORING, ED    EKG None  Radiology No results found.  Procedures Procedures (including critical care time)  Medications Ordered in ED Medications  ondansetron (ZOFRAN) injection 4 mg (has no administration in time range)  sodium chloride 0.9 % bolus 1,000 mL (has no administration in time range)     Initial Impression / Assessment and Plan / ED Course  I have reviewed the triage vital signs and the nursing notes.  Pertinent labs & imaging results that were available during my care of the patient were reviewed by me and considered in my medical decision making (see chart for details).  Clinical Course as of Jun 16 1608  Tue Jun 17, 2019  1604 Recheck patient, signed out to Dr. Zenia Resides, please refer to his note for final plan disposition   [RD]    Clinical Course User Index [RD] Lucrezia Starch, MD       58 year old male diabetic presents to ER with acute onset nausea, vomiting.  Here patient in no acute distress, normal vital signs.  Soft abdomen.  Labs concerning for profound leukocytosis, urine concerning for UTI.  Will check CT abdomen pelvis to rule out acute abdominal process, infected stone.  While awaiting CT scan, patient signed out to Dr. Zenia Resides.  Please refer to his note.  If patient tolerating p.o., CT negative, patient likely appropriate for discharge and outpatient management of UTI.    Final Clinical Impressions(s) / ED Diagnoses   Final diagnoses:  Urinary tract infection without hematuria, site  unspecified  Nausea and vomiting, intractability of vomiting not specified, unspecified vomiting type    ED Discharge Orders    None       Milagros Lollykstra, Lemario Chaikin S, MD 06/17/19 1610

## 2019-06-17 NOTE — ED Notes (Signed)
Assisted patient restroom in wheelchair for DM. Patient requested to provide UA as well. Patient will pull string once ready to return to room.

## 2019-06-17 NOTE — ED Notes (Signed)
Patient transported to PACU. Belongings sent with patient. Patient in NAD at time of transport.

## 2019-06-17 NOTE — ED Triage Notes (Addendum)
Per EMS- patient called EMS stating that he had food poisoning. Patient c/o abdominal pain and vomiting x 1 last night.  EMS obtain CBG-426. Patient stated that was his normal BS. Patient states he is noncompliant with meds which include psych meds.   Actively vomiting in triage-200 ml

## 2019-06-17 NOTE — ED Provider Notes (Signed)
Patient signed to me by Dr. Roslynn Amble.  CT results reviewed with patient.  Discussed with Dr. Bess Harvest on-call for urology.  Will come in see patient request hospitalist admission   Lacretia Leigh, MD 06/17/19 1749

## 2019-06-17 NOTE — Anesthesia Procedure Notes (Signed)
Procedure Name: Intubation Date/Time: 06/17/2019 9:42 PM Performed by: Lollie Sails, CRNA Pre-anesthesia Checklist: Patient identified, Emergency Drugs available, Suction available, Patient being monitored and Timeout performed Patient Re-evaluated:Patient Re-evaluated prior to induction Oxygen Delivery Method: Circle system utilized Preoxygenation: Pre-oxygenation with 100% oxygen Induction Type: IV induction and Rapid sequence Laryngoscope Size: Miller and 3 Grade View: Grade I Tube type: Oral Tube size: 7.5 mm Number of attempts: 1 Airway Equipment and Method: Stylet Placement Confirmation: ETT inserted through vocal cords under direct vision,  positive ETCO2 and breath sounds checked- equal and bilateral Secured at: 24 cm Tube secured with: Tape Dental Injury: Teeth and Oropharynx as per pre-operative assessment

## 2019-06-17 NOTE — Brief Op Note (Signed)
06/17/2019  10:15 PM  PATIENT:  Willie Hughes  58 y.o. male  PRE-OPERATIVE DIAGNOSIS:  Prostate abcess  POST-OPERATIVE DIAGNOSIS:  Prostate abcess  PROCEDURE:  Procedure(s): TRANSURETHRAL RESECTION OF THE PROSTATE (TURP) (N/A)  SURGEON:  Surgeon(s) and Role:    Alexis Frock, MD - Primary  PHYSICIAN ASSISTANT:   ASSISTANTS: none   ANESTHESIA:   general  EBL:  0 mL   BLOOD ADMINISTERED:none  DRAINS: 46F 3 way foley to bladder irrigation   LOCAL MEDICATIONS USED:  NONE  SPECIMEN:  1 - prostate abscess fluid - microbiology; 2 - prostate chips - patholocy  DISPOSITION OF SPECIMEN:  as above  COUNTS:  YES  TOURNIQUET:  * No tourniquets in log *  DICTATION: .Other Dictation: Dictation Number [id number]  PLAN OF CARE: Admit to inpatient   PATIENT DISPOSITION:  PACU - hemodynamically stable.   Delay start of Pharmacological VTE agent (>24hrs) due to surgical blood loss or risk of bleeding: yes

## 2019-06-18 ENCOUNTER — Encounter (HOSPITAL_COMMUNITY): Payer: Self-pay | Admitting: Urology

## 2019-06-18 DIAGNOSIS — L8990 Pressure ulcer of unspecified site, unspecified stage: Secondary | ICD-10-CM | POA: Insufficient documentation

## 2019-06-18 LAB — GLUCOSE, CAPILLARY
Glucose-Capillary: 245 mg/dL — ABNORMAL HIGH (ref 70–99)
Glucose-Capillary: 323 mg/dL — ABNORMAL HIGH (ref 70–99)
Glucose-Capillary: 293 mg/dL — ABNORMAL HIGH (ref 70–99)
Glucose-Capillary: 425 mg/dL — ABNORMAL HIGH (ref 70–99)
Glucose-Capillary: 254 mg/dL — ABNORMAL HIGH (ref 70–99)
Glucose-Capillary: 309 mg/dL — ABNORMAL HIGH (ref 70–99)
Glucose-Capillary: 351 mg/dL — ABNORMAL HIGH (ref 70–99)

## 2019-06-18 LAB — LIPID PANEL
Cholesterol: 150 mg/dL (ref 0–200)
HDL: 42 mg/dL (ref >40)
LDL Cholesterol: 85 mg/dL (ref 0–99)
Total CHOL/HDL Ratio: 3.6 RATIO
Triglycerides: 116 mg/dL (ref <150)
VLDL: 23 mg/dL (ref 0–40)

## 2019-06-18 LAB — HIV ANTIBODY (ROUTINE TESTING W REFLEX): HIV Screen 4th Generation wRfx: NONREACTIVE

## 2019-06-18 LAB — COMPREHENSIVE METABOLIC PANEL
ALT: 13 U/L (ref 0–44)
AST: 13 U/L — ABNORMAL LOW (ref 15–41)
Albumin: 3.3 g/dL — ABNORMAL LOW (ref 3.5–5.0)
Alkaline Phosphatase: 178 U/L — ABNORMAL HIGH (ref 38–126)
Anion gap: 9 (ref 5–15)
BUN: 15 mg/dL (ref 6–20)
CO2: 26 mmol/L (ref 22–32)
Calcium: 8.9 mg/dL (ref 8.9–10.3)
Chloride: 102 mmol/L (ref 98–111)
Creatinine, Ser: 0.72 mg/dL (ref 0.61–1.24)
GFR calc Af Amer: >60 mL/min (ref >60)
GFR calc non Af Amer: >60 mL/min (ref >60)
Glucose, Bld: 289 mg/dL — ABNORMAL HIGH (ref 70–99)
Potassium: 3.9 mmol/L (ref 3.5–5.1)
Sodium: 137 mmol/L (ref 135–145)
Total Bilirubin: 0.6 mg/dL (ref 0.3–1.2)
Total Protein: 6.9 g/dL (ref 6.5–8.1)

## 2019-06-18 LAB — CBC
HCT: 41.5 % (ref 39.0–52.0)
Hemoglobin: 13.4 g/dL (ref 13.0–17.0)
MCH: 29.8 pg (ref 26.0–34.0)
MCHC: 32.3 g/dL (ref 30.0–36.0)
MCV: 92.4 fL (ref 80.0–100.0)
Platelets: 284 10*3/uL (ref 150–400)
RBC: 4.49 MIL/uL (ref 4.22–5.81)
RDW: 12.3 % (ref 11.5–15.5)
WBC: 23.3 10*3/uL — ABNORMAL HIGH (ref 4.0–10.5)
nRBC: 0 % (ref 0.0–0.2)

## 2019-06-18 LAB — HEMOGLOBIN A1C
Hgb A1c MFr Bld: 13 % — ABNORMAL HIGH (ref 4.8–5.6)
Mean Plasma Glucose: 326.4 mg/dL

## 2019-06-18 LAB — TSH: TSH: 1.351 u[IU]/mL (ref 0.350–4.500)

## 2019-06-18 LAB — GENTAMICIN LEVEL, RANDOM: Gentamicin Rm: 5.1 ug/mL

## 2019-06-18 MED ORDER — ENSURE MAX PROTEIN PO LIQD
11.0000 [oz_av] | Freq: Two times a day (BID) | ORAL | Status: DC
  Administered 2019-06-18 – 2019-06-20 (×4): 11 [oz_av] via ORAL
  Filled 2019-06-18 (×7): qty 330

## 2019-06-18 MED ORDER — JUVEN PO PACK
1.0000 | PACK | Freq: Two times a day (BID) | ORAL | Status: DC
  Administered 2019-06-18 – 2019-06-20 (×3): 1 via ORAL
  Filled 2019-06-18 (×7): qty 1

## 2019-06-18 MED ORDER — INFLUENZA VAC SPLIT QUAD 0.5 ML IM SUSY: 0.5000 mL | PREFILLED_SYRINGE | INTRAMUSCULAR | Status: DC

## 2019-06-18 MED ORDER — INSULIN GLARGINE 100 UNIT/ML ~~LOC~~ SOLN
35.0000 [IU] | Freq: Every day | SUBCUTANEOUS | Status: DC
  Administered 2019-06-18: 35 [IU] via SUBCUTANEOUS
  Filled 2019-06-18 (×2): qty 0.35

## 2019-06-18 MED ORDER — CHLORHEXIDINE GLUCONATE CLOTH 2 % EX PADS
6.0000 | MEDICATED_PAD | Freq: Every day | CUTANEOUS | Status: DC
  Administered 2019-06-19 – 2019-06-20 (×2): 6 via TOPICAL

## 2019-06-18 NOTE — Progress Notes (Signed)
1 Day Post-Op   Subjective/Chief Complaint:  1 - Large Prostate Abscess / Severe GU Infection - diabetic with large glycosuria with large Rt side prostate-seminal vesical abscess by CT 06/17/19 on eval malaise, nauseas and urinary urgency. Abscess CX 11/24 GPC/ pendng. [place] 11/24 NGTD. Had transurethral resection / unroofing of abscess 11/24 late PM. On empiric Cipro + gent.   Today "Willie Hughes" is stable after transurehtral resection / unroofing prostate abscess late last night. CBG's improved.    Objective: Vital signs in last 24 hours: Temp:  [98.4 F (36.9 C)-100.3 F (37.9 C)] 98.4 F (36.9 C) (11/25 1200) Pulse Rate:  [81-102] 83 (11/25 1200) Resp:  [10-21] 10 (11/25 1200) BP: (99-161)/(51-100) 108/72 (11/25 1200) SpO2:  [93 %-100 %] 98 % (11/25 1200) Weight:  [85.2 kg-85.5 kg] 85.2 kg (11/25 0500) Last BM Date: 06/17/19  Intake/Output from previous day: 11/24 0701 - 11/25 0700 In: 7606.3 [P.O.:240; I.V.:1752; IV Piggyback:2614.2] Out: 2993 [Urine:4325; Emesis/NG output:200] Intake/Output this shift: Total I/O In: 331.3 [I.V.:331.3] Out: 2200 [Urine:2200]    Physical Exam  Constitutional: He appears well-developed.  stigmata of controlled psychiatric disease. Cooperative and AOx3.   HENT:  Head: Normocephalic.  Eyes: Pupils are equal, round, and reactive to light.  Neck: Normal range of motion.  Cardiovascular: Normal rate.  Respiratory: Effort normal.  GI: Soft.  Genitourinary:    Genitourinary Comments: Foley in place with clear urine on irrrigation.  Musculoskeletal: Normal range of motion.  Neurological: He is alert.  Skin: Skin is warm.    Lab Results:  Recent Labs    06/17/19 1357 06/18/19 0228  WBC 25.0* 23.3*  HGB 14.6 13.4  HCT 44.3 41.5  PLT 274 284   BMET Recent Labs    06/17/19 1357 06/18/19 0228  NA 132* 137  K 4.1 3.9  CL 95* 102  CO2 22 26  GLUCOSE 482* 289*  BUN 13 15  CREATININE 0.73 0.72  CALCIUM 9.0 8.9   PT/INR No results  for input(s): LABPROT, INR in the last 72 hours. ABG Recent Labs    06/17/19 1357  HCO3 26.5    Studies/Results: Ct Abdomen Pelvis W Contrast  Result Date: 06/17/2019 CLINICAL DATA:  58 year old male with history of acute generalized abdominal pain and vomiting. EXAM: CT ABDOMEN AND PELVIS WITH CONTRAST TECHNIQUE: Multidetector CT imaging of the abdomen and pelvis was performed using the standard protocol following bolus administration of intravenous contrast. CONTRAST:  14mL OMNIPAQUE IOHEXOL 300 MG/ML  SOLN COMPARISON:  CT the abdomen and pelvis 12/20/2018. FINDINGS: Lower chest: Unremarkable. Hepatobiliary: No suspicious cystic or solid hepatic lesions. No intra or extrahepatic biliary ductal dilatation. Gallbladder is normal in appearance. Pancreas: No pancreatic mass. No pancreatic ductal dilatation. No pancreatic or peripancreatic fluid collections or inflammatory changes. Spleen: Unremarkable. Adrenals/Urinary Tract: Bilateral kidneys and bilateral adrenal glands are normal in appearance. No hydroureteronephrosis. Urinary bladder is normal in appearance. Stomach/Bowel: Normal appearance of the stomach. No pathologic dilatation of small bowel or colon. Normal appendix. Vascular/Lymphatic: Aortic atherosclerosis, without evidence of aneurysm or dissection in the abdominal or pelvic vasculature. No lymphadenopathy noted in the abdomen or pelvis. Reproductive: In the right-side of the prostate gland there is a well-defined 2.6 x 2.0 x 2.8 cm low-attenuation lesion (axial image 87 of series 3 and coronal image 85 of series 4), concerning for a prosthetic abscess. There is also asymmetric enlargement of the right seminal vesicles with subtle surrounding inflammatory changes. Other: No significant volume of ascites.  No pneumoperitoneum. Musculoskeletal: There are  no aggressive appearing lytic or blastic lesions noted in the visualized portions of the skeleton. IMPRESSION: 1. Findings are concerning for  probable right-sided prostate abscess with involvement of the right seminal vesicle (either seminal vesiculitis or seminal vesicle abscess). Urologic consultation is recommended. 2. Aortic atherosclerosis. Electronically Signed   By: Trudie Reed M.D.   On: 06/17/2019 16:45    Anti-infectives: Anti-infectives (From admission, onward)   Start     Dose/Rate Route Frequency Ordered Stop   06/18/19 0600  ciprofloxacin (CIPRO) IVPB 400 mg     400 mg 200 mL/hr over 60 Minutes Intravenous Every 12 hours 06/17/19 1834     06/17/19 1900  gentamicin (GARAMYCIN) 590 mg in dextrose 5 % 100 mL IVPB     7 mg/kg  84.4 kg 114.8 mL/hr over 60 Minutes Intravenous Every 24 hours 06/17/19 1834     06/17/19 1845  ciprofloxacin (CIPRO) IVPB 400 mg     400 mg 200 mL/hr over 60 Minutes Intravenous  Once 06/17/19 1834 06/17/19 1957   06/17/19 1615  cefTRIAXone (ROCEPHIN) 2 g in sodium chloride 0.9 % 100 mL IVPB     2 g 200 mL/hr over 30 Minutes Intravenous  Once 06/17/19 1606 06/17/19 1716   06/17/19 0000  cephALEXin (KEFLEX) 500 MG capsule     500 mg Oral 3 times daily 06/17/19 1609 06/24/19 2359      Assessment/Plan:  Doing well initially after operative drainage of large prostate-seminal vesical abscess. AGree with current ABX and maintian current foley for now. Will DC foley after afebrile x 24 hours.   Greatly appreciate medical team comanagment, especially of his severe hyperglycemia.   Willie Hughes 06/18/2019

## 2019-06-18 NOTE — Progress Notes (Signed)
Patient to transfer to 4E 1404 report given to receiving nurse, all questions answered at this time.  Pt. VSS with no s/s of distress noted.  Patient stable at transfer.

## 2019-06-18 NOTE — Op Note (Signed)
NAMEQUADRE, BRISTOL MEDICAL RECORD [id number] ACCOUNT [id number] DATE OF BIRTH:1960/10/06 FACILITY: [facility] LOCATION: [facility] PHYSICIAN:Thamar Holik Tresa Moore, MD  OPERATIVE REPORT  DATE OF PROCEDURE:  06/17/2019  PREOPERATIVE DIAGNOSIS:  Prostate abscess.  POSTOPERATIVE DIAGNOSES:  Prostate abscess in large prostate  PROCEDURE:  Transurethral resection of prostate with unroofing of prostate abscess.  ESTIMATED BLOOD LOSS:  20 mL.  COMPLICATIONS:  None.  SPECIMENS: 1.  Prostate abscess fluid for permanent pathology. 2.  Prostate chips for pathology.  FINDINGS: 1.  Moderate bilobar prostatic hypertrophy. 2.  Large abscess collection in the right anterolateral prostate area as expected. 3.  Successful placement of 24-French 3-way Foley catheter to normal saline irrigation.  INDICATIONS:  The patient is a 58 year old man with history of diabetes who has trouble obtaining medications.  He was noted in a workup of abdominal pain, malaise and nausea to have very large prostate abscess by imaging today as well as significant  hyperglycemia.  Options were discussed including recommended path of transurethral unroofing in addition to intravenous antibiotics and he wished to proceed.  Informed consent was then placed and obtained in medical record.  DESCRIPTION OF PROCEDURE:  Patient identified as patient, procedure being transection of prostate with unroofing was confirmed.  Procedure timeout was performed.  Antibiotics were administered.  General endotracheal anesthesia induced.  The patient was  placed into a low lithotomy position, sterile field scrubbed, prepped prepping and draping the patient's penis, perineum and proximal thighs using iodine.  Cystourethroscopy was performed using a 26-French resectoscope sheath with visual obturator.   Inspection of anterior and posterior urethra revealed some modest prostatic hypertrophy.  Inspection of bladder revealed diffuse erythema consistent with  cystitis.  Bilateral ureteral orifices were singleton.  No papillary lesions or calcifications were  noted.  Next, using medium size resectoscope loop transection of the prostate was performed of the right prostate from the area of the bladder neck towards the area of verumontanum in a top down approach, approximately 1 cm depth.  The abscess cavity was  entered in the right posterior aspect of the prostate and thick white purulent fluid immediately effluxed.  This was drained.  A sample of fluid was set aside for Gram stain and culture.  Additional unroofing was performed such that the abscess cavity  widely communicated with the prostatic urethra.  Prostate chips were irrigated and set aside from pathology.  The base of the resection area was carefully fulgurated.  A sensor wire was left in place under direct vision over which a 24-French 3-way Foley  catheter was carefully placed, 20 mL of water in balloon.  Patient placed on normal saline irrigation, efflux was essentially clear and the procedure was terminated.  The patient tolerated the procedure well.  No immediate complications.  The patient  was taken to postanesthesia care in stable condition with plan for hospital admission.  CN/NUANCE  D:06/17/2019 T:06/18/2019 JOB:[id number]

## 2019-06-18 NOTE — Progress Notes (Signed)
Rx Brief note: Gentamicin  Assessment: Random Gentamicin = 5.1 mcg/ml.    Lab drew level at 6.5 hours post infusion vs. 10 hours post-infusion as ordered???   Plan: Continue Gentamicin 590 mg IV q24h as per [place] nomogram F/u scr, levels as  Needed  Thanks [place], [place] 06/18/2019 6:00 AM

## 2019-06-18 NOTE — Progress Notes (Signed)
PROGRESS NOTE    Willie Hughes  QVZ:[id number] DOB: 11-May-1961 DOA: 06/17/2019 PCP: Patient, No Pcp Per   Brief Narrative:  Willie Hughes is a 58 y.o. male with medical history significant for but not limited to noncompliance with medications, acne, allergic rhinitis, allergies, bipolar disorder, COPD, ED, heart disease, hypothyroidism, hyperlipidemia, type 2 diabetes mellitus  who presents with chief complaint of feeling unwell and malaise and had some nausea vomiting and urinary frequency for last 18 hours.  No other complaint, fever or chills.  Upon arrival to emergency room, he was hemodynamically stable.  CT abdomen and pelvis with contrast showed possible right-sided prostate abscess with involvement of the right seminal vesicle/seminal vasculitis or seminal vesicle abscess.  Urology was consulted and patient was hospitalized under hospitalist service.  He received 2 L of IV fluid in the emergency department and IV Rocephin.  Repeat Covid testing was negative.  He was taken to the OR by urology and underwent transection of prostate with unroofing of prostate abscess.  Antibiotics were escalated to ciprofloxacin, clindamycin and gentamicin.   Assessment & Plan:   Active Problems:   Hyperlipidemia   Bipolar disorder (HCC)   Hypothyroidism   Chronic allergic rhinitis   Uncontrolled type 2 diabetes mellitus with peripheral neuropathy (HCC)   Prostate abscess   Hyponatremia   Pressure injury of skin  Sepsis secondary to prostate and seminal vesicle abscess/UTI: Meets sepsis criteria based on tachycardia, tachypnea and leukocytosis.  Status post transection of prostate with unroofing of prostate abscess by urology on 06/17/2019.  Afebrile.  Leukocytosis improving.  Feeling better today.  He is on 3 different type of antibiotics and we defer antibiotic selection to or urology.  Type 2 diabetes mellitus with hyperglycemia: Blood sugar significantly elevated in 400s.  Uses 44 units of Lantus at  home.  He has been started on 15.  I will increase Lantus to 35 units starting this morning and continue sliding scale insulin.  Consider IV regular insulin if remains hyperglycemic.  Pseudohyponatremia: Likely due to hyperglycemia.  Resolved.  Recent COVID 19 Diasease?:  According to patient he had symptoms back in March and in May however he was never tested for COVID-19 but he assumes that he had 1.  He has been tested negative here.  No concerns at this point in time.  Hypothyroidism: TSH within normal limits.  Patient states that he has not taken any Synthroid since last 8 months and that he had hypothyroidism due to being on lithium which resolved.  Will discontinue Synthroid.   HLD: Not taking his fenofibrate or Zocor.  Check lipid panel.  Bipolar Disorder: To be on lithium but has not been taking them.  Will monitor off lithium for now.  DVT prophylaxis: Heparin Code Status: Full code Family Communication:  None present at bedside.  Plan of care discussed with patient in length and he verbalized understanding and agreed with it. Disposition Plan: To be determined  Estimated body mass index is 22.86 kg/m as calculated from the following:   Height as of this encounter: 6\' 4"  (1.93 m).   Weight as of this encounter: 85.2 kg.  Pressure Injury 06/17/19 Buttocks Right;Mid Unstageable - Full thickness tissue loss in which the base of the ulcer is covered by slough (yellow, tan, gray, green or brown) and/or eschar (tan, brown or black) in the wound bed. (Active)  06/17/19 2330  Location: Buttocks  Location Orientation: Right;Mid  Staging: Unstageable - Full thickness tissue loss in which the base of  the ulcer is covered by slough (yellow, tan, gray, green or brown) and/or eschar (tan, brown or black) in the wound bed.  Wound Description (Comments):   Present on Admission: Yes     Pressure Injury 06/17/19 Buttocks Right;Mid Unstageable - Full thickness tissue loss in which the base  of the ulcer is covered by slough (yellow, tan, gray, green or brown) and/or eschar (tan, brown or black) in the wound bed. (Active)  06/17/19 2330  Location: Buttocks  Location Orientation: Right;Mid  Staging: Unstageable - Full thickness tissue loss in which the base of the ulcer is covered by slough (yellow, tan, gray, green or brown) and/or eschar (tan, brown or black) in the wound bed.  Wound Description (Comments):   Present on Admission: Yes     Nutritional status:               Consultants:   Urology  Procedures:   transection of prostate with unroofing of prostate abscess on 06/17/2019  Antimicrobials:   IV ciprofloxacin, clindamycin and gentamicin   Subjective: Seen and examined.  Minimal discomfort due to Foley catheter.  No other complaint otherwise.  Objective: Vitals:   06/18/19 0700 06/18/19 0800 06/18/19 0900 06/18/19 1000  BP: 111/60 (!) 113/53 110/65 111/61  Pulse: 83 81 87 91  Resp: 16 12 (!) 21 14  Temp:  100.3 F (37.9 C)    TempSrc:  Oral    SpO2: 95% 96% 99% 99%  Weight:      Height:        Intake/Output Summary (Last 24 hours) at 06/18/2019 1008 Last data filed at 06/18/2019 0758 Gross per 24 hour  Intake 7606.26 ml  Output 4975 ml  Net 2631.26 ml   Filed Weights   06/17/19 1321 06/18/19 0000 06/18/19 0500  Weight: 84.4 kg 85.5 kg 85.2 kg    Examination:  General exam: Appears calm and comfortable  Respiratory system: Clear to auscultation. Respiratory effort normal. Cardiovascular system: S1 & S2 heard, RRR. No JVD, murmurs, rubs, gallops or clicks. No pedal edema. Gastrointestinal system: Abdomen is nondistended, soft and nontender. No organomegaly or masses felt. Normal bowel sounds heard. Central nervous system: Alert and oriented. No focal neurological deficits. Extremities: Symmetric 5 x 5 power. Skin: No rashes, lesions or ulcers Psychiatry: Judgement and insight appear normal. Mood & affect appropriate.    Data  Reviewed: I have personally reviewed following labs and imaging studies  CBC: Recent Labs  Lab 06/17/19 1357 06/18/19 0228  WBC 25.0* 23.3*  NEUTROABS 22.9*  --   HGB 14.6 13.4  HCT 44.3 41.5  MCV 91.2 92.4  PLT 274 284   Basic Metabolic Panel: Recent Labs  Lab 06/17/19 1357 06/18/19 0228  NA 132* 137  K 4.1 3.9  CL 95* 102  CO2 22 26  GLUCOSE 482* 289*  BUN 13 15  CREATININE 0.73 0.72  CALCIUM 9.0 8.9   GFR: Estimated Creatinine Clearance: 121.3 mL/min (by C-G formula based on SCr of 0.72 mg/dL). Liver Function Tests: Recent Labs  Lab 06/18/19 0228  AST 13*  ALT 13  ALKPHOS 178*  BILITOT 0.6  PROT 6.9  ALBUMIN 3.3*   No results for input(s): LIPASE, AMYLASE in the last 168 hours. No results for input(s): AMMONIA in the last 168 hours. Coagulation Profile: No results for input(s): INR, PROTIME in the last 168 hours. Cardiac Enzymes: No results for input(s): CKTOTAL, CKMB, CKMBINDEX, TROPONINI in the last 168 hours. BNP (last 3 results) No results for input(s):  PROBNP in the last 8760 hours. HbA1C: Recent Labs    06/18/19 0228  HGBA1C 13.0*   CBG: Recent Labs  Lab 06/17/19 1753 06/17/19 2202 06/18/19 0019 06/18/19 0348 06/18/19 0824  GLUCAP 453* 392* 323* 245* 293*   Lipid Profile: No results for input(s): CHOL, HDL, LDLCALC, TRIG, CHOLHDL, LDLDIRECT in the last 72 hours. Thyroid Function Tests: Recent Labs    06/18/19 0228  TSH 1.351   Anemia Panel: No results for input(s): VITAMINB12, FOLATE, FERRITIN, TIBC, IRON, RETICCTPCT in the last 72 hours. Sepsis Labs: No results for input(s): PROCALCITON, LATICACIDVEN in the last 168 hours.  Recent Results (from the past 240 hour(s))  SARS Coronavirus 2 by RT PCR (hospital order, performed in [facility] hospital lab) Nasopharyngeal Nasopharyngeal Swab     Status: None   Collection Time: 06/17/19  5:55 PM   Specimen: Nasopharyngeal Swab  Result Value Ref Range Status   SARS Coronavirus 2  NEGATIVE NEGATIVE Final    Comment: (NOTE) SARS-CoV-2 target nucleic acids are NOT DETECTED. The SARS-CoV-2 RNA is generally detectable in upper and lower respiratory specimens during the acute phase of infection. The lowest concentration of SARS-CoV-2 viral copies this assay can detect is 250 copies / mL. A negative result does not preclude SARS-CoV-2 infection and should not be used as the sole basis for treatment or other patient management decisions.  A negative result may occur with improper specimen collection / handling, submission of specimen other than nasopharyngeal swab, presence of viral mutation(s) within the areas targeted by this assay, and inadequate number of viral copies (<250 copies / mL). A negative result must be combined with clinical observations, patient history, and epidemiological information. Fact Sheet for Patients:   BoilerBrush.com.cyhttps://www.fda.gov/media/136312/download Fact Sheet for Healthcare Providers: https://pope.com/https://www.fda.gov/media/136313/download This test is not yet approved or cleared  by the [place] FDA and has been authorized for detection and/or diagnosis of SARS-CoV-2 by FDA under an Emergency Use Authorization (EUA).  This EUA will remain in effect (meaning this test can be used) for the duration of the COVID-19 declaration under Section 564(b)(1) of the Act, 21 U.S.C. section 360bbb-3(b)(1), unless the authorization is terminated or revoked sooner. Performed at [facility], [address] [place], [place] [address]   Aerobic/Anaerobic Culture (surgical/deep wound)     Status: None (Preliminary result)   Collection Time: 06/17/19  9:50 PM   Specimen: Abscess  Result Value Ref Range Status   Specimen Description   Final    ABSCESS PROSTATE Performed at [facility], [address] [place], [place] [address]    Special Requests   Final    NONE Performed at [facility], [address] [place],  [place] [address]    Gram Stain PENDING  Incomplete   Culture PENDING  Incomplete   Report Status PENDING  Incomplete      Radiology Studies: Ct Abdomen Pelvis W Contrast  Result Date: 06/17/2019 CLINICAL DATA:  58 year old male with history of acute generalized abdominal pain and vomiting. EXAM: CT ABDOMEN AND PELVIS WITH CONTRAST TECHNIQUE: Multidetector CT imaging of the abdomen and pelvis was performed using the standard protocol following bolus administration of intravenous contrast. CONTRAST:  100mL OMNIPAQUE IOHEXOL 300 MG/ML  SOLN COMPARISON:  CT the abdomen and pelvis 12/20/2018. FINDINGS: Lower chest: Unremarkable. Hepatobiliary: No suspicious cystic or solid hepatic lesions. No intra or extrahepatic biliary ductal dilatation. Gallbladder is normal in appearance. Pancreas: No pancreatic mass. No pancreatic ductal dilatation. No pancreatic or peripancreatic  fluid collections or inflammatory changes. Spleen: Unremarkable. Adrenals/Urinary Tract: Bilateral kidneys and bilateral adrenal glands are normal in appearance. No hydroureteronephrosis. Urinary bladder is normal in appearance. Stomach/Bowel: Normal appearance of the stomach. No pathologic dilatation of small bowel or colon. Normal appendix. Vascular/Lymphatic: Aortic atherosclerosis, without evidence of aneurysm or dissection in the abdominal or pelvic vasculature. No lymphadenopathy noted in the abdomen or pelvis. Reproductive: In the right-side of the prostate gland there is a well-defined 2.6 x 2.0 x 2.8 cm low-attenuation lesion (axial image 87 of series 3 and coronal image 85 of series 4), concerning for a prosthetic abscess. There is also asymmetric enlargement of the right seminal vesicles with subtle surrounding inflammatory changes. Other: No significant volume of ascites.  No pneumoperitoneum. Musculoskeletal: There are no aggressive appearing lytic or blastic lesions noted in the visualized portions of the skeleton. IMPRESSION: 1.  Findings are concerning for probable right-sided prostate abscess with involvement of the right seminal vesicle (either seminal vesiculitis or seminal vesicle abscess). Urologic consultation is recommended. 2. Aortic atherosclerosis. Electronically Signed   By: Trudie Reed M.D.   On: 06/17/2019 16:45    Scheduled Meds:  Chlorhexidine Gluconate Cloth  6 each Topical Daily   clindamycin   Topical BID   fentaNYL       heparin  5,000 Units Subcutaneous Q8H   [START ON 06/19/2019] influenza vac split quadrivalent PF  0.5 mL Intramuscular Tomorrow-1000   insulin aspart  0-15 Units Subcutaneous TID WC   insulin aspart  0-5 Units Subcutaneous QHS   insulin glargine  35 Units Subcutaneous Daily   levothyroxine  75 mcg Oral Q0600   loratadine  10 mg Oral Daily   multivitamin with minerals  1 tablet Oral Daily   omega-3 acid ethyl esters  1 g Oral Daily   vitamin C  500 mg Oral Daily   Continuous Infusions:  sodium chloride 100 mL/hr at 06/18/19 0700   ciprofloxacin Stopped (06/18/19 8295)   gentamicin Stopped (06/17/19 2106)   sodium chloride irrigation       LOS: 1 day   Time spent: 35 minutes   Hughie Closs, MD [facility]  06/18/2019, 10:08 AM   To contact the attending provider between 7A-7P or the covering provider during after hours 7P-7A, please log into the web site [email or web address] and use password [email or web address].

## 2019-06-18 NOTE — Progress Notes (Signed)
   06/18/19 1100  Clinical Encounter Type  Visited With Patient  Visit Type Initial;Psychological support;Spiritual support  Referral From Nurse  Consult/Referral To Chaplain  Spiritual Encounters  Spiritual Needs Ritual;Other (Comment) (Spiritual Care Conversation/Support)  Stress Factors  Patient Stress Factors Other (Comment) (Spiritual Concerns)   I visited with Mr. Osorto per spiritual care consult. Mr. Weigelt stated that he is thinking about converting to Catholicism and was interested in speaking with a Idelle Crouch, but did not want me to contact one for him.  He did not state any needs at this time.   Chaplain Shanon Ace M.Div., [facility]

## 2019-06-18 NOTE — Progress Notes (Signed)
Initial Nutrition Assessment  INTERVENTION:   -Ensure MAX Protein po BID, each supplement provides 150 kcal and 30 grams of protein -Juven Fruit Punch BID, each serving provides 95kcal and 2.5g of protein (amino acids glutamine and arginine)  NUTRITION DIAGNOSIS:   Increased nutrient needs related to wound healing as evidenced by estimated needs.  GOAL:   Patient will meet greater than or equal to 90% of their needs  MONITOR:   PO intake, Supplement acceptance, Labs, Weight trends, I & O's, Skin  REASON FOR ASSESSMENT:   Malnutrition Screening Tool    ASSESSMENT:   57 y.o. male with medical history significant for but not limited to noncompliance with medications, acne, allergic rhinitis, allergies, bipolar disorder, COPD, ED, heart disease, hypothyroidism, hyperlipidemia, type 2 diabetes mellitus was uncontrolled as well as other comorbidities who presents with chief complaint of feeling unwell and malaise and had some nausea vomiting for last 18 hours.  He states that he is I will keep anything down I thought he had food poisoning. Was worked up in the emergency room found to have a prostate and seminal vesicle abscess.  TR[facility] was called to admit this patient and urology was consulted and will take the patient straight to the OR.  11/24: s/p Transection of prostate with unroofing of prostate abscess  **RD working remotely**  Patient now on heart healthy/ CHO modified diet as of this morning. No PO documented at this time.  Pt with unstageable pressure injury to right buttocks, will order Ensure Max and Juven supplements to aid in wound healing.   Per weight records, pt has lost 42 lbs since 3/5 (18% wt loss x 9 months, significant for time frame).   I/Os: +212 ml since admit UOP: 7525 ml x 24 hours  Medications: Multivitamin with minerals daily, Lovaza capsule, Vitamin C tablet  Labs reviewed:  CBGs: 245-293  NUTRITION - FOCUSED PHYSICAL EXAM:  Working remotely.  Diet  Order:   Diet Order            Diet heart healthy/carb modified Room service appropriate? Yes; Fluid consistency: Thin  Diet effective now              EDUCATION NEEDS:   No education needs have been identified at this time  Skin:  Skin Assessment: Skin Integrity Issues: Skin Integrity Issues:: Unstageable Unstageable: right buttocks  Last BM:  11/24  Height:   Ht Readings from Last 1 Encounters:  06/18/19 6\' 4"  (1.93 m)    Weight:   Wt Readings from Last 1 Encounters:  06/18/19 85.2 kg    Ideal Body Weight:  91.8 kg  BMI:  Body mass index is 22.86 kg/m.  Estimated Nutritional Needs:   Kcal:  2200-2400  Protein:  100-115g  Fluid:  2L/day  Clayton Bibles, MS, RD, LDN Inpatient Clinical Dietitian Pager: [phone] After Hours Pager: [phone]

## 2019-06-18 NOTE — Progress Notes (Addendum)
Inpatient Diabetes Program Recommendations  AACE/ADA: New Consensus Statement on Inpatient Glycemic Control (2015)  Target Ranges:  Prepandial:   less than 140 mg/dL      Peak postprandial:   less than 180 mg/dL (1-2 hours)      Critically ill patients:  140 - 180 mg/dL   Lab Results  Component Value Date   GLUCAP 293 (H) 06/18/2019   HGBA1C 13.0 (H) 06/18/2019    Review of Glycemic Control  Diabetes history: DM2 Outpatient Diabetes medications: Tresiba 44 units Daily, Novolog 15-18 units tid with meals (getting samples from Dr. Cruzita Lederer, Endocrinology) Current orders for Inpatient glycemic control:  Lantus 35 units Daily Novolog 0-15 units tid  Spoke with patient about diabetes and home regimen for diabetes control. Patient reports that he does not have insulin at home.   Patient says he does not have money in order to get insulin. Says the insulin from [facility] does not work and is not worth the $25.  Pt also reports not being able to go back to [facility] in addition to a lot of [facility] practices.   Patient was speaking about wanting to move because of health care wanting to get insulin from [place]. 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 Pt reports he thinks he has ALS. Pt cursing occasionally, speaking about sueing his family and other people in order to get money to pay for his healthcare. Pt has psychiatric history.  Pt says he is open to options. Not sure if we can get him into our clinics or not?  Will place CM consult. Reports not having a PCP.  Discussed current A1c. Pt would like to be able to get and take his insulin per his report.   Thanks,  Tama Headings RN, MSN, BC-ADM Inpatient Diabetes Coordinator Team Pager [phone] (8a-5p)

## 2019-06-19 LAB — CBC WITH DIFFERENTIAL/PLATELET
Abs Immature Granulocytes: 0.14 10*3/uL — ABNORMAL HIGH (ref 0.00–0.07)
Basophils Absolute: 0 10*3/uL (ref 0.0–0.1)
Basophils Relative: 0 %
Eosinophils Absolute: 0 10*3/uL (ref 0.0–0.5)
Eosinophils Relative: 0 %
HCT: 37.5 % — ABNORMAL LOW (ref 39.0–52.0)
Hemoglobin: 11.8 g/dL — ABNORMAL LOW (ref 13.0–17.0)
Immature Granulocytes: 1 %
Lymphocytes Relative: 9 %
Lymphs Abs: 1.8 10*3/uL (ref 0.7–4.0)
MCH: 29.5 pg (ref 26.0–34.0)
MCHC: 31.5 g/dL (ref 30.0–36.0)
MCV: 93.8 fL (ref 80.0–100.0)
Monocytes Absolute: 1.8 10*3/uL — ABNORMAL HIGH (ref 0.1–1.0)
Monocytes Relative: 9 %
Neutro Abs: 15.2 10*3/uL — ABNORMAL HIGH (ref 1.7–7.7)
Neutrophils Relative %: 81 %
Platelets: 233 10*3/uL (ref 150–400)
RBC: 4 MIL/uL — ABNORMAL LOW (ref 4.22–5.81)
RDW: 12.3 % (ref 11.5–15.5)
WBC: 19 10*3/uL — ABNORMAL HIGH (ref 4.0–10.5)
nRBC: 0 % (ref 0.0–0.2)

## 2019-06-19 LAB — BASIC METABOLIC PANEL
Anion gap: 8 (ref 5–15)
BUN: 16 mg/dL (ref 6–20)
CO2: 23 mmol/L (ref 22–32)
Calcium: 8.4 mg/dL — ABNORMAL LOW (ref 8.9–10.3)
Chloride: 101 mmol/L (ref 98–111)
Creatinine, Ser: 0.66 mg/dL (ref 0.61–1.24)
GFR calc Af Amer: >60 mL/min (ref >60)
GFR calc non Af Amer: >60 mL/min (ref >60)
Glucose, Bld: 264 mg/dL — ABNORMAL HIGH (ref 70–99)
Potassium: 3.8 mmol/L (ref 3.5–5.1)
Sodium: 132 mmol/L — ABNORMAL LOW (ref 135–145)

## 2019-06-19 LAB — URINE CULTURE: Culture: >=100000 — AB

## 2019-06-19 LAB — GLUCOSE, CAPILLARY
Glucose-Capillary: 264 mg/dL — ABNORMAL HIGH (ref 70–99)
Glucose-Capillary: 337 mg/dL — ABNORMAL HIGH (ref 70–99)
Glucose-Capillary: 298 mg/dL — ABNORMAL HIGH (ref 70–99)
Glucose-Capillary: 214 mg/dL — ABNORMAL HIGH (ref 70–99)

## 2019-06-19 MED ORDER — INSULIN ASPART 100 UNIT/ML ~~LOC~~ SOLN
0.0000 [IU] | Freq: Three times a day (TID) | SUBCUTANEOUS | Status: DC
  Administered 2019-06-19: 8 [IU] via SUBCUTANEOUS
  Administered 2019-06-19: 11 [IU] via SUBCUTANEOUS
  Administered 2019-06-20: 8 [IU] via SUBCUTANEOUS
  Administered 2019-06-20 (×2): 5 [IU] via SUBCUTANEOUS
  Administered 2019-06-21: 11 [IU] via SUBCUTANEOUS
  Administered 2019-06-21: 2 [IU] via SUBCUTANEOUS

## 2019-06-19 MED ORDER — INSULIN ASPART 100 UNIT/ML ~~LOC~~ SOLN
4.0000 [IU] | Freq: Three times a day (TID) | SUBCUTANEOUS | Status: DC
  Administered 2019-06-19 – 2019-06-21 (×7): 4 [IU] via SUBCUTANEOUS

## 2019-06-19 MED ORDER — VANCOMYCIN HCL 10 G IV SOLR
1750.0000 mg | Freq: Two times a day (BID) | INTRAVENOUS | Status: DC
  Administered 2019-06-19 – 2019-06-21 (×4): 1750 mg via INTRAVENOUS
  Filled 2019-06-19 (×5): qty 1750

## 2019-06-19 MED ORDER — INSULIN ASPART 100 UNIT/ML ~~LOC~~ SOLN
0.0000 [IU] | Freq: Every day | SUBCUTANEOUS | Status: DC
  Administered 2019-06-19 – 2019-06-20 (×2): 2 [IU] via SUBCUTANEOUS

## 2019-06-19 MED ORDER — VANCOMYCIN HCL 10 G IV SOLR
2000.0000 mg | Freq: Once | INTRAVENOUS | Status: AC
  Administered 2019-06-19: 2000 mg via INTRAVENOUS
  Filled 2019-06-19: qty 2000

## 2019-06-19 MED ORDER — INSULIN GLARGINE 100 UNIT/ML ~~LOC~~ SOLN
50.0000 [IU] | Freq: Every day | SUBCUTANEOUS | Status: DC
  Administered 2019-06-20: 50 [IU] via SUBCUTANEOUS
  Filled 2019-06-19 (×2): qty 0.5

## 2019-06-19 NOTE — Progress Notes (Signed)
Urology Inpatient Progress Report  Urinary tract infection without hematuria, site unspecified [N39.0] Nausea and vomiting, intractability of vomiting not specified, unspecified vomiting type [R11.2] Prostate abscess [N41.2]  Procedure(s): TRANSURETHRAL RESECTION OF THE PROSTATE (TURP)  2 Days Post-Op   Intv/Subj: Febrile overnight. Patient is without complaint.  Feels a lot better.  Leukocytosis of 19.  Cultures growing MRSA.  Active Problems:   Hyperlipidemia   Bipolar disorder (HCC)   Hypothyroidism   Chronic allergic rhinitis   Uncontrolled type 2 diabetes mellitus with peripheral neuropathy (HCC)   Prostate abscess   Hyponatremia   Pressure injury of skin  Current Facility-Administered Medications  Medication Dose Route Frequency Provider Last Rate Last Dose  . 0.9 %  sodium chloride infusion   Intravenous Continuous Marguerita MerlesSheikh, Omair [place] Ridge[place]if, DO 10 mL/hr at 06/18/19 [id number]    . acetaminophen (TYLENOL) tablet 650 mg  650 mg Oral Q6H PRN Marguerita MerlesSheikh, Omair Latif, DO      . albuterol (PROVENTIL) (2.5 MG/3ML) 0.083% nebulizer solution 2.5 mg  2.5 mg Inhalation Q4H PRN Sheikh, Omair Latif, DO      . bisacodyl (DULCOLAX) suppository 10 mg  10 mg Rectal Daily PRN Marguerita MerlesSheikh, Omair Latif, DO      . Chlorhexidine Gluconate Cloth 2 % PADS 6 each  6 each Topical Daily Marguerita MerlesSheikh, Omair [place], [place]   6 each at 06/19/19 [id number]  . ciprofloxacin (CIPRO) IVPB 400 mg  400 mg Intravenous Q12H Marguerita MerlesSheikh, Omair WatsonLatif, DO 200 mL/hr at 06/19/19 0512 400 mg at 06/19/19 0512  . clindamycin (CLEOCIN T) 1 % external solution   Topical BID Marguerita MerlesSheikh, Omair [place]if, [place]   1 application at 06/19/19 [id number]  . gentamicin (GARAMYCIN) 590 mg in dextrose 5 % 100 mL IVPB  7 mg/kg Intravenous Q24H Marguerita MerlesSheikh, Omair PeakLatif, DO   Stopped at 06/18/19 2113  . heparin injection 5,000 Units  5,000 Units Subcutaneous [address] Sheikh, Omair [place], [place]   5,000 Units at 06/19/19 [id number]  . influenza vac split quadrivalent PF (FLUARIX) injection 0.5 mL  0.5 mL  Intramuscular Tomorrow-1000 Sheikh, Omair Latif, DO      . insulin aspart (novoLOG) injection 0-15 Units  0-15 Units Subcutaneous TID WC Pahwani, Ravi, MD      . insulin aspart (novoLOG) injection 0-5 Units  0-5 Units Subcutaneous QHS Pahwani, Ravi, MD      . insulin aspart (novoLOG) injection 4 Units  4 Units Subcutaneous TID WC Pahwani, Ravi, MD      . insulin glargine (LANTUS) injection 50 Units  50 Units Subcutaneous Daily Pahwani, Ravi, MD      . loratadine (CLARITIN) tablet 10 mg  10 mg Oral Daily Marguerita MerlesSheikh, Omair BergholzLatif, DO   10 mg at 06/19/19 [id number]  . multivitamin with minerals tablet 1 tablet  1 tablet Oral Daily Marguerita MerlesSheikh, Omair [place]if, [place]   1 tablet at 06/19/19 [id number]  . nutrition supplement (JUVEN) (JUVEN) powder packet 1 packet  1 packet Oral BID WC Hughie ClossPahwani, Ravi, MD   1 packet at 06/19/19 0810  . omega-3 acid ethyl esters (LOVAZA) capsule 1 g  1 g Oral Daily Marguerita MerlesSheikh, Omair [place]if, DO   1 g at 06/19/19 [id number]  . ondansetron (ZOFRAN) tablet 4 mg  4 mg Oral Q6H PRN Marguerita MerlesSheikh, Omair Latif, DO       Or  . ondansetron [facility](ZOFRAN) injection 4 mg  4 mg Intravenous Q6H PRN Sheikh, Omair Latif, DO      . protein supplement (ENSURE MAX) liquid  11 oz Oral BID Pahwani, Daleen Boavi,  MD   11 oz at 06/18/19 1610  . senna-docusate (Senokot-S) tablet 1 tablet  1 tablet Oral QHS PRN Raiford Noble Latif, DO      . sodium chloride irrigation 0.9 % 3,000 mL  3,000 mL Irrigation Continuous Alexis Frock, MD   3,000 mL at 06/18/19 0655  . traMADol (ULTRAM) tablet 50 mg  50 mg Oral Q6H PRN Raiford Noble [place], DO   50 mg at 06/18/19 0102  . vitamin C (ASCORBIC ACID) tablet 500 mg  500 mg Oral Daily Raiford Noble [place], DO   500 mg at 06/19/19 7253     Objective: Vital: Vitals:   06/18/19 2015 06/19/19 0500 06/19/19 0544 06/19/19 1125  BP: 128/72  93/64   Pulse: 86  83   Resp: 16  16   Temp: (!) 100.7 F (38.2 C)  98.8 F (37.1 C)   TempSrc:   Oral   SpO2: 95%  92% 97%  Weight:  90.8 kg    Height:       I/Os: I/O  last 3 completed shifts: In: 7810.6 [P.O.:240; I.V.:2583.3; Other:3000; IV Piggyback:1987.3] Out: 66440 [Urine:11550]  Physical Exam:  General: Patient is in no apparent distress Lungs: Normal respiratory effort, chest expands symmetrically. GI:The abdomen is soft and nontender without mass. Foley: Three-way Foley catheter draining light pink urine Ext: lower extremities symmetric  Lab Results: Recent Labs    06/17/19 1357 06/18/19 0228 06/19/19 0531  WBC 25.0* 23.3* 19.0*  HGB 14.6 13.4 11.8*  HCT 44.3 41.5 37.5*   Recent Labs    06/17/19 1357 06/18/19 0228 06/19/19 0531  NA 132* 137 132*  K 4.1 3.9 3.8  CL 95* 102 101  CO2 22 26 23   GLUCOSE 482* 289* 264*  BUN 13 15 16   CREATININE 0.73 0.72 0.66  CALCIUM 9.0 8.9 8.4*   No results for input(s): LABPT, INR in the last 72 hours. No results for input(s): LABURIN in the last 72 hours. Results for orders placed or performed during the hospital encounter of 06/17/19  UCx     Status: Abnormal   Collection Time: 06/17/19  1:57 PM   Specimen: Urine, Clean Catch  Result Value Ref Range Status   Specimen Description   Final    URINE, CLEAN CATCH Performed at [facility], [place] [address], [place], [place] [address]    Special Requests   Final    NONE Performed at [facility], [place] [address], [place], [place] [address]    Culture (A)  Final    >=100,000 COLONIES/mL METHICILLIN RESISTANT STAPHYLOCOCCUS AUREUS   Report Status 06/19/2019 FINAL  Final   Organism ID, Bacteria METHICILLIN RESISTANT STAPHYLOCOCCUS AUREUS (A)  Final      Susceptibility   Methicillin resistant staphylococcus aureus - MIC*    CIPROFLOXACIN >=8 RESISTANT Resistant     GENTAMICIN <=0.5 SENSITIVE Sensitive     NITROFURANTOIN <=16 SENSITIVE Sensitive     OXACILLIN >=4 RESISTANT Resistant     TETRACYCLINE <=1 SENSITIVE Sensitive     VANCOMYCIN <=0.5 SENSITIVE Sensitive     TRIMETH/SULFA <=10 SENSITIVE  Sensitive     CLINDAMYCIN >=8 RESISTANT Resistant     RIFAMPIN <=0.5 SENSITIVE Sensitive     Inducible Clindamycin NEGATIVE Sensitive     * >=100,000 COLONIES/mL METHICILLIN RESISTANT STAPHYLOCOCCUS AUREUS  SARS Coronavirus 2 by RT PCR (hospital order, performed in [place] hospital lab) Nasopharyngeal Nasopharyngeal Swab     Status: None   Collection Time: 06/17/19  5:55  PM   Specimen: Nasopharyngeal Swab  Result Value Ref Range Status   SARS Coronavirus 2 NEGATIVE NEGATIVE Final    Comment: (NOTE) SARS-CoV-2 target nucleic acids are NOT DETECTED. The SARS-CoV-2 RNA is generally detectable in upper and lower respiratory specimens during the acute phase of infection. The lowest concentration of SARS-CoV-2 viral copies this assay can detect is 250 copies / mL. A negative result does not preclude SARS-CoV-2 infection and should not be used as the sole basis for treatment or other patient management decisions.  A negative result may occur with improper specimen collection / handling, submission of specimen other than nasopharyngeal swab, presence of viral mutation(s) within the areas targeted by this assay, and inadequate number of viral copies (<250 copies / mL). A negative result must be combined with clinical observations, patient history, and epidemiological information. Fact Sheet for Patients:   BoilerBrush.com.cy Fact Sheet for Healthcare Providers: https://pope.com/ This test is not yet approved or cleared  by the [place] FDA and has been authorized for detection and/or diagnosis of SARS-CoV-2 by FDA under an Emergency Use Authorization (EUA).  This EUA will remain in effect (meaning this test can be used) for the duration of the COVID-19 declaration under Section 564(b)(1) of the Act, 21 U.S.C. section 360bbb-3(b)(1), unless the authorization is terminated or revoked sooner. Performed at [facility],  [address], [place], [place] [address]   blood cx x2     Status: None (Preliminary result)   Collection Time: 06/17/19  6:36 PM   Specimen: BLOOD LEFT FOREARM  Result Value Ref Range Status   Specimen Description   Final    BLOOD LEFT FOREARM Performed at [facility], [address] [place], [place] [address]    Special Requests   Final    BOTTLES DRAWN AEROBIC AND ANAEROBIC Blood Culture adequate volume Performed at [facility], [address] [place], [place] [address]    Culture   Final    NO GROWTH < 24 HOURS Performed at [facility], [address], [place], [place] [address]    Report Status PENDING  Incomplete  blood cx x2     Status: None (Preliminary result)   Collection Time: 06/17/19  6:36 PM   Specimen: BLOOD RIGHT FOREARM  Result Value Ref Range Status   Specimen Description   Final    BLOOD RIGHT FOREARM Performed at [facility], [address] [place], [place] [address]    Special Requests   Final    BOTTLES DRAWN AEROBIC AND ANAEROBIC Blood Culture adequate volume Performed at [facility], [address], [place], [place] [address]    Culture   Final    NO GROWTH < 24 HOURS Performed at [facility], [address] [place], [place] [address]    Report Status PENDING  Incomplete  Aerobic Culture (superficial specimen)     Status: None (Preliminary result)   Collection Time: 06/17/19  9:50 PM   Specimen: Wound  Result Value Ref Range Status   Specimen Description WOUND  Final   Special Requests NONE  Final   Gram Stain NO WBC SEEN NO ORGANISMS SEEN   Final   Culture   Final    CULTURE REINCUBATED FOR BETTER GROWTH Performed at [facility], [address], [place], [place] [address]    Report Status PENDING  Incomplete  Aerobic/Anaerobic Culture (surgical/deep wound)     Status: None (  Preliminary result)   Collection Time: 06/17/19  9:50 PM    Specimen: Abscess  Result Value Ref Range Status   Specimen Description   Final    ABSCESS PROSTATE Performed at [facility], [address], [place], [place] [address]    Special Requests   Final    NONE Performed at [facility], [address], [place], [place] [address]    Gram Stain   Final    FEW WBC PRESENT,BOTH PMN AND MONONUCLEAR FEW GRAM POSITIVE COCCI IN CLUSTERS Performed at [facility], [address], [place], [place] [address]    Culture ABUNDANT STAPHYLOCOCCUS AUREUS  Final   Report Status PENDING  Incomplete    Studies/Results: Ct Abdomen Pelvis W Contrast  Result Date: 06/17/2019 CLINICAL DATA:  58 year old male with history of acute generalized abdominal pain and vomiting. EXAM: CT ABDOMEN AND PELVIS WITH CONTRAST TECHNIQUE: Multidetector CT imaging of the abdomen and pelvis was performed using the standard protocol following bolus administration of intravenous contrast. CONTRAST:  OMNIPAQUE IOHEXOL 300 MG/ML  SOLN COMPARISON:  CT the abdomen and pelvis 12/20/2018. FINDINGS: Lower chest: Unremarkable. Hepatobiliary: No suspicious cystic or solid hepatic lesions. No intra or extrahepatic biliary ductal dilatation. Gallbladder is normal in appearance. Pancreas: No pancreatic mass. No pancreatic ductal dilatation. No pancreatic or peripancreatic fluid collections or inflammatory changes. Spleen: Unremarkable. Adrenals/Urinary Tract: Bilateral kidneys and bilateral adrenal glands are normal in appearance. No hydroureteronephrosis. Urinary bladder is normal in appearance. Stomach/Bowel: Normal appearance of the stomach. No pathologic dilatation of small bowel or colon. Normal appendix. Vascular/Lymphatic: Aortic atherosclerosis, without evidence of aneurysm or dissection in the abdominal or pelvic vasculature. No lymphadenopathy noted in the abdomen or pelvis. Reproductive: In the right-side of the prostate gland there is a well-defined 2.6  x 2.0 x 2.8 cm low-attenuation lesion (axial image 87 of series 3 and coronal image 85 of series 4), concerning for a prosthetic abscess. There is also asymmetric enlargement of the right seminal vesicles with subtle surrounding inflammatory changes. Other: No significant volume of ascites.  No pneumoperitoneum. Musculoskeletal: There are no aggressive appearing lytic or blastic lesions noted in the visualized portions of the skeleton. IMPRESSION: 1. Findings are concerning for probable right-sided prostate abscess with involvement of the right seminal vesicle (either seminal vesiculitis or seminal vesicle abscess). Urologic consultation is recommended. 2. Aortic atherosclerosis. Electronically Signed   By: Trudie Reed M.D.   On: 06/17/2019 16:45    Assessment: Prostate abscess  Procedure(s): TRANSURETHRAL RESECTION OF THE PROSTATE (TURP), 2 Days Post-Op  doing well.  Plan: Antibiotics per primary team.  Maintain Foley catheter today.  If he does well we will perform a voiding trial tomorrow.   Modena Slater, MD Urology 06/19/2019, 11:38 AM

## 2019-06-19 NOTE — Progress Notes (Signed)
Assess arms post infiltration.  Right arm very redden possible reaction vs phlebitis? Not many suitable veins available.  Recommend PICC if going to continue with vancomycin.

## 2019-06-19 NOTE — Progress Notes (Signed)
Pharmacy Antibiotic Note  Willie Hughes is a 58 y.o. male presented to the ED on 06/17/2019 with c/o n/v.  Abdominal CT showed findings with concern for a large right-sided prostate abscess.  Urologist recom. to treat with gentamicin and ciprofloxacin for double coverage and for TURP.  Today, 06/19/2019:  Afebrile today; low grade fevers yesterday  WBC still elevated but improving  SCr stable WNL  MRSA growing in urine; S aureus in all other cultures  Plan:  Stop gent/Cipro  Vancomycin 2000 mg IV now, then 1750 mg IV q12 hr (est AUC 499 based on SCr rounded to 0.8; Vd 0.72)  Measure vancomycin AUC at steady state as indicated  SCr q48 hr while on vancomycin  _______________________________________  Height: 6\' 4"  (193 cm) Weight: 200 lb 1.6 oz (90.8 kg) IBW/kg (Calculated) : 86.8  Temp (24hrs), Avg:100.1 F (37.8 C), Min:98.8 F (37.1 C), Max:100.8 F (38.2 C)  Recent Labs  Lab 06/17/19 1357 06/18/19 0228 06/19/19 0531  WBC 25.0* 23.3* 19.0*  CREATININE 0.73 0.72 0.66  GENTRANDOM  --  5.1  --     Estimated Creatinine Clearance: 123.6 mL/min (by C-G formula based on SCr of 0.66 mg/dL).    Allergies  Allergen Reactions  . Kava Kava     Rash   . Aluminum Rash    Aluminum compounds in antiperspirant.   . Other Rash    Some antiperspirants cause a rash   Antimicrobials this admission:  11/24 cipro >> 11/26 11/24 gent >> 11/26 11/26 vanc >>   Dose adjustments this admission:  6.5 hour Gent level = 5.1  Microbiology results:  11/24 BCx: ngtd 11/24 Prostate abscess: abundant S aureus 11/24 UCx: >100k MRSA 11/24 wound Cx (source?): rare S aureus 11/24 COVID: negative   Thank you for allowing pharmacy to be a part of this patient's care.  Daylah Sayavong A 06/19/2019 1:04 PM

## 2019-06-19 NOTE — Progress Notes (Signed)
PROGRESS NOTE    Willie Hughes  WUJ:[id number] DOB: 1961/04/17 DOA: 06/17/2019 PCP: Patient, No Pcp Per   Brief Narrative:  Willie Hughes is a 58 y.o. male with medical history significant for but not limited to noncompliance with medications, acne, allergic rhinitis, allergies, bipolar disorder, COPD, ED, heart disease, hypothyroidism, hyperlipidemia, type 2 diabetes mellitus  who presents with chief complaint of feeling unwell and malaise and had some nausea vomiting and urinary frequency for last 18 hours.  No other complaint, fever or chills.  Upon arrival to emergency room, he was hemodynamically stable.  CT abdomen and pelvis with contrast showed possible right-sided prostate abscess with involvement of the right seminal vesicle/seminal vasculitis or seminal vesicle abscess.  Urology was consulted and patient was hospitalized under hospitalist service.  He received 2 L of IV fluid in the emergency department and IV Rocephin.  Repeat Covid testing was negative.  He was taken to the OR by urology and underwent transection of prostate with unroofing of prostate abscess.  Antibiotics were escalated to ciprofloxacin, clindamycin and gentamicin.  Subsequently his urine culture and prostate abscess culture grew MRSA.  Antibiotics were switched to vancomycin on 06/19/2019.   Assessment & Plan:   Active Problems:   Hyperlipidemia   Bipolar disorder (HCC)   Hypothyroidism   Chronic allergic rhinitis   Uncontrolled type 2 diabetes mellitus with peripheral neuropathy (HCC)   Prostate abscess   Hyponatremia   Pressure injury of skin  Sepsis secondary to prostate and seminal vesicle MRSA abscess/MRSA UTI: Meets sepsis criteria based on tachycardia, tachypnea and leukocytosis.  Sepsis parameters have resolved.  Status post transection of prostate with unroofing of prostate abscess by urology on 06/17/2019.  Afebrile.  Leukocytosis improving.  Feeling better today.  Urine as well as prostate abscess culture  is growing MRSA.  We will switch to IV vancomycin and discontinue all other antibiotics.  Type 2 diabetes mellitus with hyperglycemia: Blood sugar still elevated but better than yesterday.  Will increase Lantus to 50 units and place on 3 times daily premeal 4 units of insulin as well as SSI.  Pseudohyponatremia: Likely due to hyperglycemia.  Resolved.  Recent COVID 19 Diasease?:  According to patient he had symptoms back in March and in May however he was never tested for COVID-19 but he assumes that he had 1.  He has been tested negative here.  No concerns at this point in time.  Hypothyroidism: TSH within normal limits.  Patient states that he has not taken any Synthroid since last 8 months and that he had hypothyroidism due to being on lithium which resolved.  Will discontinue Synthroid.   HLD: Lipid panel within normal limits.  No need of statins.  Bipolar Disorder: Stopped taking his lithium 6 months ago.  Doing well.  Monitor off of it now.  DVT prophylaxis: Heparin Code Status: Full code Family Communication:  None present at bedside.  Plan of care discussed with patient in length and he verbalized understanding and agreed with it. Disposition Plan: To be determined  Estimated body mass index is 24.36 kg/m as calculated from the following:   Height as of this encounter:  (1.93 m).   Weight as of this encounter: 90.8 kg.  Pressure Injury 06/17/19 Buttocks Right;Mid Unstageable - Full thickness tissue loss in which the base of the ulcer is covered by slough (yellow, tan, gray, green or brown) and/or eschar (tan, brown or black) in the wound bed. (Active)  06/17/19 2330  Location: Buttocks  Location Orientation: Right;Mid  Staging: Unstageable - Full thickness tissue loss in which the base of the ulcer is covered by slough (yellow, tan, gray, green or brown) and/or eschar (tan, brown or black) in the wound bed.  Wound Description (Comments):   Present on Admission: Yes      Pressure Injury 06/17/19 Buttocks Right;Mid Unstageable - Full thickness tissue loss in which the base of the ulcer is covered by slough (yellow, tan, gray, green or brown) and/or eschar (tan, brown or black) in the wound bed. (Active)  06/17/19 2330  Location: Buttocks  Location Orientation: Right;Mid  Staging: Unstageable - Full thickness tissue loss in which the base of the ulcer is covered by slough (yellow, tan, gray, green or brown) and/or eschar (tan, brown or black) in the wound bed.  Wound Description (Comments):   Present on Admission: Yes     Nutritional status:  Nutrition Problem: Increased nutrient needs Etiology: wound healing   Signs/Symptoms: estimated needs   Interventions: Juven(Ensure Max)    Consultants:   Urology  Procedures:   transection of prostate with unroofing of prostate abscess on 06/17/2019  Antimicrobials:   IV ciprofloxacin, clindamycin and gentamicin > 06/19/2019  IV vancomycin 06/19/2019>    Subjective: Seen and examined.  He has no complaints.  Wants his Foley out.  He tells me that he was not taking any of his medications for last 6 to 9 months.  Objective: Vitals:   06/18/19 1900 06/18/19 2015 06/19/19 0500 06/19/19 0544  BP: (!) 101/47 128/72  93/64  Pulse: 88 86  83  Resp: 17 16  16   Temp:  (!) 100.7 F (38.2 C)  98.8 F (37.1 C)  TempSrc:    Oral  SpO2: 94% 95%  92%  Weight:   90.8 kg   Height:        Intake/Output Summary (Last 24 hours) at 06/19/2019 1004 Last data filed at 06/19/2019 0730 Gross per 24 hour  Intake 1443.35 ml  Output 6625 ml  Net -5181.65 ml   Filed Weights   06/18/19 0000 06/18/19 0500 06/19/19 0500  Weight: 85.5 kg 85.2 kg 90.8 kg    Examination:  General exam: Appears calm and comfortable  Respiratory system: Clear to auscultation. Respiratory effort normal. Cardiovascular system: S1 & S2 heard, RRR. No JVD, murmurs, rubs, gallops or clicks. No pedal edema. Gastrointestinal system:  Abdomen is nondistended, soft and nontender. No organomegaly or masses felt. Normal bowel sounds heard. Central nervous system: Alert and oriented. No focal neurological deficits. Extremities: Symmetric 5 x 5 power. Skin: No rashes, lesions or ulcers.  Psychiatry: Judgement and insight appear poor, mood & affect appropriate.   Data Reviewed: I have personally reviewed following labs and imaging studies  CBC: Recent Labs  Lab 06/17/19 1357 06/18/19 0228 06/19/19 0531  WBC 25.0* 23.3* 19.0*  NEUTROABS 22.9*  --  15.2*  HGB 14.6 13.4 11.8*  HCT 44.3 41.5 37.5*  MCV 91.2 92.4 93.8  PLT 274 284 732   Basic Metabolic Panel: Recent Labs  Lab 06/17/19 1357 06/18/19 0228 06/19/19 0531  NA 132* 137 132*  K 4.1 3.9 3.8  CL 95* 102 101  CO2 22 26 23   GLUCOSE 482* 289* 264*  BUN 13 15 16   CREATININE 0.73 0.72 0.66  CALCIUM 9.0 8.9 8.4*   GFR: Estimated Creatinine Clearance: 123.6 mL/min (by C-G formula based on SCr of 0.66 mg/dL). Liver Function Tests: Recent Labs  Lab 06/18/19 0228  AST 13*  ALT 13  ALKPHOS 178*  BILITOT 0.6  PROT 6.9  ALBUMIN 3.3*   No results for input(s): LIPASE, AMYLASE in the last 168 hours. No results for input(s): AMMONIA in the last 168 hours. Coagulation Profile: No results for input(s): INR, PROTIME in the last 168 hours. Cardiac Enzymes: No results for input(s): CKTOTAL, CKMB, CKMBINDEX, TROPONINI in the last 168 hours. BNP (last 3 results) No results for input(s): PROBNP in the last 8760 hours. HbA1C: Recent Labs    06/18/19 0228  HGBA1C 13.0*   CBG: Recent Labs  Lab 06/18/19 0824 06/18/19 1208 06/18/19 1644 06/18/19 2018 06/19/19 0757  GLUCAP 293* 351* 254* 309* 264*   Lipid Profile: Recent Labs    06/18/19 0228  CHOL 150  HDL 42  LDLCALC 85  TRIG 116  CHOLHDL 3.6   Thyroid Function Tests: Recent Labs    06/18/19 0228  TSH 1.351   Anemia Panel: No results for input(s): VITAMINB12, FOLATE, FERRITIN, TIBC, IRON,  RETICCTPCT in the last 72 hours. Sepsis Labs: No results for input(s): PROCALCITON, LATICACIDVEN in the last 168 hours.  Recent Results (from the past 240 hour(s))  UCx     Status: Abnormal   Collection Time: 06/17/19  1:57 PM   Specimen: Urine, Clean Catch  Result Value Ref Range Status   Specimen Description   Final    URINE, CLEAN CATCH Performed at [facility], [address], [place], [place] [address]    Special Requests   Final    NONE Performed at [facility], [address], [place], [place] [address]    Culture (A)  Final    >=100,000 COLONIES/mL METHICILLIN RESISTANT STAPHYLOCOCCUS AUREUS   Report Status 06/19/2019 FINAL  Final   Organism ID, Bacteria METHICILLIN RESISTANT STAPHYLOCOCCUS AUREUS (A)  Final      Susceptibility   Methicillin resistant staphylococcus aureus - MIC*    CIPROFLOXACIN >=8 RESISTANT Resistant     GENTAMICIN <=0.5 SENSITIVE Sensitive     NITROFURANTOIN <=16 SENSITIVE Sensitive     OXACILLIN >=4 RESISTANT Resistant     TETRACYCLINE <=1 SENSITIVE Sensitive     VANCOMYCIN <=0.5 SENSITIVE Sensitive     TRIMETH/SULFA <=10 SENSITIVE Sensitive     CLINDAMYCIN >=8 RESISTANT Resistant     RIFAMPIN <=0.5 SENSITIVE Sensitive     Inducible Clindamycin NEGATIVE Sensitive     * >=100,000 COLONIES/mL METHICILLIN RESISTANT STAPHYLOCOCCUS AUREUS  SARS Coronavirus 2 by RT PCR (hospital order, performed in [facility] hospital lab) Nasopharyngeal Nasopharyngeal Swab     Status: None   Collection Time: 06/17/19  5:55 PM   Specimen: Nasopharyngeal Swab  Result Value Ref Range Status   SARS Coronavirus 2 NEGATIVE NEGATIVE Final    Comment: (NOTE) SARS-CoV-2 target nucleic acids are NOT DETECTED. The SARS-CoV-2 RNA is generally detectable in upper and lower respiratory specimens during the acute phase of infection. The lowest concentration of SARS-CoV-2 viral copies this assay can detect is 250 copies / mL. A negative  result does not preclude SARS-CoV-2 infection and should not be used as the sole basis for treatment or other patient management decisions.  A negative result may occur with improper specimen collection / handling, submission of specimen other than nasopharyngeal swab, presence of viral mutation(s) within the areas targeted by this assay, and inadequate number of viral copies (<250 copies / mL). A negative result must be combined with clinical observations, patient history, and epidemiological information. Fact Sheet for Patients:   BoilerBrush.com.cy Fact Sheet for Healthcare Providers:  https://pope.com/ This test is not yet approved or cleared  by the [place] and has been authorized for detection and/or diagnosis of SARS-CoV-2 by FDA under an Emergency Use Authorization (EUA).  This EUA will remain in effect (meaning this test can be used) for the duration of the COVID-19 declaration under Section 564(b)(1) of the Act, 21 U.S.C. section 360bbb-3(b)(1), unless the authorization is terminated or revoked sooner. Performed at [facility], [address] [place], [place] [address]   blood cx x2     Status: None (Preliminary result)   Collection Time: 06/17/19  6:36 PM   Specimen: BLOOD LEFT FOREARM  Result Value Ref Range Status   Specimen Description   Final    BLOOD LEFT FOREARM Performed at [facility], [address], [place], [place] [address]    Special Requests   Final    BOTTLES DRAWN AEROBIC AND ANAEROBIC Blood Culture adequate volume Performed at [facility], [address], [place], [place] [address]    Culture   Final    NO GROWTH < 24 HOURS Performed at [facility], [address], [place], [place] [address]    Report Status PENDING  Incomplete  blood cx x2     Status: None (Preliminary result)   Collection Time: 06/17/19  6:36 PM   Specimen:  BLOOD RIGHT FOREARM  Result Value Ref Range Status   Specimen Description   Final    BLOOD RIGHT FOREARM Performed at [facility], [address], [place], [place] [address]    Special Requests   Final    BOTTLES DRAWN AEROBIC AND ANAEROBIC Blood Culture adequate volume Performed at [facility], [address], [place], [place] [address]    Culture   Final    NO GROWTH < 24 HOURS Performed at [facility], [address], [place], [place] [address]    Report Status PENDING  Incomplete  Aerobic Culture (superficial specimen)     Status: None (Preliminary result)   Collection Time: 06/17/19  9:50 PM   Specimen: Wound  Result Value Ref Range Status   Specimen Description WOUND  Final   Special Requests NONE  Final   Gram Stain   Final    NO WBC SEEN NO ORGANISMS SEEN Performed at [facility], [address] [place], [place] [address]    Culture PENDING  Incomplete   Report Status PENDING  Incomplete  Aerobic/Anaerobic Culture (surgical/deep wound)     Status: None (Preliminary result)   Collection Time: 06/17/19  9:50 PM   Specimen: Abscess  Result Value Ref Range Status   Specimen Description   Final    ABSCESS PROSTATE Performed at [facility], [address] [place], [place] [address]    Special Requests   Final    NONE Performed at [facility], [address] [place], [place] [address]    Gram Stain   Final    FEW WBC PRESENT,BOTH PMN AND MONONUCLEAR FEW GRAM POSITIVE COCCI IN CLUSTERS Performed at [facility], [address] [place], [place] [address]    Culture PENDING  Incomplete   Report Status PENDING  Incomplete      Radiology Studies: Ct Abdomen Pelvis W Contrast  Result Date: 06/17/2019 CLINICAL DATA:  58 year old male with history of acute generalized abdominal pain and vomiting. EXAM: CT ABDOMEN AND PELVIS WITH CONTRAST TECHNIQUE: Multidetector CT imaging  of the  abdomen and pelvis was performed using the standard protocol following bolus administration of intravenous contrast. CONTRAST:  100mL OMNIPAQUE IOHEXOL 300 MG/ML  SOLN COMPARISON:  CT the abdomen and pelvis 12/20/2018. FINDINGS: Lower chest: Unremarkable. Hepatobiliary: No suspicious cystic or solid hepatic lesions. No intra or extrahepatic biliary ductal dilatation. Gallbladder is normal in appearance. Pancreas: No pancreatic mass. No pancreatic ductal dilatation. No pancreatic or peripancreatic fluid collections or inflammatory changes. Spleen: Unremarkable. Adrenals/Urinary Tract: Bilateral kidneys and bilateral adrenal glands are normal in appearance. No hydroureteronephrosis. Urinary bladder is normal in appearance. Stomach/Bowel: Normal appearance of the stomach. No pathologic dilatation of small bowel or colon. Normal appendix. Vascular/Lymphatic: Aortic atherosclerosis, without evidence of aneurysm or dissection in the abdominal or pelvic vasculature. No lymphadenopathy noted in the abdomen or pelvis. Reproductive: In the right-side of the prostate gland there is a well-defined 2.6 x 2.0 x 2.8 cm low-attenuation lesion (axial image 87 of series 3 and coronal image 85 of series 4), concerning for a prosthetic abscess. There is also asymmetric enlargement of the right seminal vesicles with subtle surrounding inflammatory changes. Other: No significant volume of ascites.  No pneumoperitoneum. Musculoskeletal: There are no aggressive appearing lytic or blastic lesions noted in the visualized portions of the skeleton. IMPRESSION: 1. Findings are concerning for probable right-sided prostate abscess with involvement of the right seminal vesicle (either seminal vesiculitis or seminal vesicle abscess). Urologic consultation is recommended. 2. Aortic atherosclerosis. Electronically Signed   By: Trudie Reedaniel  Entrikin M.D.   On: 06/17/2019 16:45    Scheduled Meds:  Chlorhexidine Gluconate Cloth  6 each Topical Daily    clindamycin   Topical BID   heparin  5,000 Units Subcutaneous Q8H   influenza vac split quadrivalent PF  0.5 mL Intramuscular Tomorrow-1000   insulin aspart  0-15 Units Subcutaneous TID WC   insulin aspart  0-5 Units Subcutaneous QHS   insulin aspart  4 Units Subcutaneous TID WC   insulin glargine  50 Units Subcutaneous Daily   loratadine  10 mg Oral Daily   multivitamin with minerals  1 tablet Oral Daily   nutrition supplement (JUVEN)  1 packet Oral BID WC   omega-3 acid ethyl esters  1 g Oral Daily   Ensure Max Protein  11 oz Oral BID   vitamin C  500 mg Oral Daily   Continuous Infusions:  sodium chloride 10 mL/hr at 06/18/19 1915   ciprofloxacin 400 mg (06/19/19 0512)   gentamicin Stopped (06/18/19 2113)   sodium chloride irrigation       LOS: 2 days   Time spent: 29 minutes   Hughie Clossavi Emalee Knies, MD [facility]  06/19/2019, 10:04 AM   To contact the attending provider between 7A-7P or the covering provider during after hours 7P-7A, please log into the web site [email or web address] and use password [email or web address].

## 2019-06-20 LAB — CBC WITH DIFFERENTIAL/PLATELET
Abs Immature Granulocytes: 0.11 10*3/uL — ABNORMAL HIGH (ref 0.00–0.07)
Basophils Absolute: 0.1 10*3/uL (ref 0.0–0.1)
Basophils Relative: 0 %
Eosinophils Absolute: 0.1 10*3/uL (ref 0.0–0.5)
Eosinophils Relative: 1 %
HCT: 39 % (ref 39.0–52.0)
Hemoglobin: 12.3 g/dL — ABNORMAL LOW (ref 13.0–17.0)
Immature Granulocytes: 1 %
Lymphocytes Relative: 11 %
Lymphs Abs: 1.8 10*3/uL (ref 0.7–4.0)
MCH: 29.8 pg (ref 26.0–34.0)
MCHC: 31.5 g/dL (ref 30.0–36.0)
MCV: 94.4 fL (ref 80.0–100.0)
Monocytes Absolute: 1.5 10*3/uL — ABNORMAL HIGH (ref 0.1–1.0)
Monocytes Relative: 9 %
Neutro Abs: 13.2 10*3/uL — ABNORMAL HIGH (ref 1.7–7.7)
Neutrophils Relative %: 78 %
Platelets: 232 10*3/uL (ref 150–400)
RBC: 4.13 MIL/uL — ABNORMAL LOW (ref 4.22–5.81)
RDW: 12.2 % (ref 11.5–15.5)
WBC: 16.7 10*3/uL — ABNORMAL HIGH (ref 4.0–10.5)
nRBC: 0 % (ref 0.0–0.2)

## 2019-06-20 LAB — BASIC METABOLIC PANEL
Anion gap: 8 (ref 5–15)
BUN: 12 mg/dL (ref 6–20)
CO2: 26 mmol/L (ref 22–32)
Calcium: 8.7 mg/dL — ABNORMAL LOW (ref 8.9–10.3)
Chloride: 101 mmol/L (ref 98–111)
Creatinine, Ser: 0.57 mg/dL — ABNORMAL LOW (ref 0.61–1.24)
GFR calc Af Amer: >60 mL/min (ref >60)
GFR calc non Af Amer: >60 mL/min (ref >60)
Glucose, Bld: 228 mg/dL — ABNORMAL HIGH (ref 70–99)
Potassium: 3.7 mmol/L (ref 3.5–5.1)
Sodium: 135 mmol/L (ref 135–145)

## 2019-06-20 LAB — GLUCOSE, CAPILLARY
Glucose-Capillary: 215 mg/dL — ABNORMAL HIGH (ref 70–99)
Glucose-Capillary: 278 mg/dL — ABNORMAL HIGH (ref 70–99)
Glucose-Capillary: 239 mg/dL — ABNORMAL HIGH (ref 70–99)
Glucose-Capillary: 222 mg/dL — ABNORMAL HIGH (ref 70–99)

## 2019-06-20 MED ORDER — INSULIN GLARGINE 100 UNIT/ML ~~LOC~~ SOLN
30.0000 [IU] | Freq: Two times a day (BID) | SUBCUTANEOUS | Status: DC
  Administered 2019-06-20 – 2019-06-21 (×2): 30 [IU] via SUBCUTANEOUS
  Filled 2019-06-20 (×3): qty 0.3

## 2019-06-20 MED ORDER — SULFAMETHOXAZOLE-TRIMETHOPRIM 800-160 MG PO TABS
1.0000 | ORAL_TABLET | Freq: Two times a day (BID) | ORAL | Status: DC
  Administered 2019-06-20 – 2019-06-21 (×2): 1 via ORAL
  Filled 2019-06-20 (×2): qty 1

## 2019-06-20 NOTE — Progress Notes (Signed)
PROGRESS NOTE    Willie Hughes  IRC:[id number] DOB: Dec 16, 1960 DOA: 06/17/2019 PCP: Patient, No Pcp Per   Brief Narrative:  Willie Hughes is a 58 y.o. male with medical history significant for but not limited to noncompliance with medications, acne, allergic rhinitis, allergies, bipolar disorder, COPD, ED, heart disease, hypothyroidism, hyperlipidemia, type 2 diabetes mellitus  who presents with chief complaint of feeling unwell and malaise and had some nausea vomiting and urinary frequency for last 18 hours.  No other complaint, fever or chills.  Upon arrival to emergency room, he was hemodynamically stable.  CT abdomen and pelvis with contrast showed possible right-sided prostate abscess with involvement of the right seminal vesicle/seminal vasculitis or seminal vesicle abscess.  Urology was consulted and patient was hospitalized under hospitalist service.  He received 2 L of IV fluid in the emergency department and IV Rocephin.  Repeat Covid testing was negative.  He was taken to the OR by urology and underwent transection of prostate with unroofing of prostate abscess.  Antibiotics were escalated to ciprofloxacin, clindamycin and gentamicin.  Subsequently his urine culture and prostate abscess culture grew MRSA.  Antibiotics were switched to vancomycin on 06/19/2019.    Assessment & Plan:   Active Problems:   Hyperlipidemia   Bipolar disorder (HCC)   Hypothyroidism   Chronic allergic rhinitis   Uncontrolled type 2 diabetes mellitus with peripheral neuropathy (HCC)   Prostate abscess   Hyponatremia   Pressure injury of skin  Sepsis secondary to prostate and seminal vesicle MRSA abscess/MRSA UTI: Met sepsis criteria based on tachycardia, tachypnea and leukocytosis.  Sepsis parameters have resolved.  Status post transection of prostate with unroofing of prostate abscess by urology on 06/17/2019.  Afebrile.  Leukocytosis improving.  Improving and feeling better. Urine as well as prostate abscess  culture is growing MRSA.  Was a started on vancomycin on 06/19/2019.  MRSA susceptible to Bactrim DS.  Discussed case with ID on-call Dr. Linus Salmons who recommended treating with Bactrim DS for 3 weeks total.  We will switch antibiotics.    Type 2 diabetes mellitus with hyperglycemia: Blood sugar still elevated.  He received 50 units of Lantus this morning.  Starting tonight, I will switch him to 30 units twice daily and continue current premeal resume and SSI.  Rounded on this patient along with nurse this morning.  Requested RN to notify social worker to talk to patient so they can arrange insulin and other medications for him since he has not been taking them due to an affordability issue.  Pseudohyponatremia: Likely due to hyperglycemia.  Resolved.  Recent COVID 19 Diasease?:  According to patient he had symptoms back in March and in May however he was never tested for COVID-19 but he assumes that he had 1.  He has been tested negative here.  No concerns at this point in time.  Hypothyroidism: TSH within normal limits.  Patient states that he has not taken any Synthroid since last 8 months and that he had hypothyroidism due to being on lithium which resolved.  Will discontinue Synthroid.   HLD: Lipid panel within normal limits.  No need of statins.  Bipolar Disorder: Stopped taking his lithium 6 months ago.  Doing well.  Monitor off of it now.  Medication noncompliance/affordability issue: He claims that he has not been able to afford his medications and his insurance will not cover that.  Had a very long discussion with social worker to see if we can help him in some way.  I  was informed by social worker that since he has his own insurance so he is bound to use that and will need to pay his co-pay however if patient does not want to pay then that would be his choice.  DVT prophylaxis: Heparin Code Status: Full code Family Communication:  None present at bedside.  Plan of care discussed with  patient in length and he verbalized understanding and agreed with it. Disposition Plan: Potential discharge tomorrow.  Waiting for social worker to make arrangements for him to get medications especially insulin.  Estimated body mass index is 24.65 kg/m as calculated from the following:   Height as of this encounter: 6' 4" (1.93 m).   Weight as of this encounter: 91.9 kg.  Pressure Injury 06/17/19 Buttocks Right;Mid Unstageable - Full thickness tissue loss in which the base of the ulcer is covered by slough (yellow, tan, gray, green or brown) and/or eschar (tan, brown or black) in the wound bed. (Active)  06/17/19 2330  Location: Buttocks  Location Orientation: Right;Mid  Staging: Unstageable - Full thickness tissue loss in which the base of the ulcer is covered by slough (yellow, tan, gray, green or brown) and/or eschar (tan, brown or black) in the wound bed.  Wound Description (Comments):   Present on Admission: Yes     Pressure Injury 06/17/19 Buttocks Right;Mid Unstageable - Full thickness tissue loss in which the base of the ulcer is covered by slough (yellow, tan, gray, green or brown) and/or eschar (tan, brown or black) in the wound bed. (Active)  06/17/19 2330  Location: Buttocks  Location Orientation: Right;Mid  Staging: Unstageable - Full thickness tissue loss in which the base of the ulcer is covered by slough (yellow, tan, gray, green or brown) and/or eschar (tan, brown or black) in the wound bed.  Wound Description (Comments):   Present on Admission: Yes     Nutritional status:  Nutrition Problem: Increased nutrient needs Etiology: wound healing   Signs/Symptoms: estimated needs   Interventions: Juven(Ensure Max)    Consultants:   Urology  Procedures:   transection of prostate with unroofing of prostate abscess on feels better.  No abdominal pain.  Wants his Foley out.  Objective: Vitals:   06/19/19 1125 06/19/19 1447 06/19/19 2159 06/20/19 0517  BP:  (!)  101/46 120/71 130/74  Pulse:  82 79 74  Resp:  _0 Temp:  98.9 F (37.2 C) 99.6 F (37.6 C) 98.6 F (37 C)  TempSrc:  Oral Oral Oral  SpO2: 97% 100% 98% 97%  Weight:    91.9 kg  Height:        Intake/Output Summary (Last 24 hours) at 06/20/2019 1027 Last data filed at 06/20/2019 0528 Gross per 24 hour  Intake 2028.66 ml  Output 3750 ml  Net -1721.34 ml   Filed Weights   06/18/19 0500 06/19/19 0500 06/20/19 0517  Weight: 85.2 kg 90.8 kg 91.9 kg    Examination:  General exam: Appears calm and comfortable  Respiratory system: Clear to auscultation. Respiratory effort normal. Cardiovascular system: S1 & S2 heard, RRR. No JVD, murmurs, rubs, gallops or clicks. No pedal edema. Gastrointestinal system: Abdomen is nondistended, soft and nontender. No organomegaly or masses felt. Normal bowel sounds heard. Central nervous system: Alert and oriented. No focal neurological deficits. Extremities: Symmetric 5 x 5 power. Skin: No rashes, lesions or ulcers.  Psychiatry: Judgement and insight appear normal. Mood & affect appropriate.   Data Reviewed: I have personally reviewed following labs and imaging  studies  CBC: Recent Labs  Lab 06/17/19 1357 06/18/19 0228 06/19/19 0531 06/20/19 0428  WBC 25.0* 23.3* 19.0* 16.7*  NEUTROABS 22.9*  --  15.2* 13.2*  HGB 14.6 13.4 11.8* 12.3*  HCT 44.3 41.5 37.5* 39.0  MCV 91.2 92.4 93.8 94.4  PLT 274 284 233 096   Basic Metabolic Panel: Recent Labs  Lab 06/17/19 1357 06/18/19 0228 06/19/19 0531 06/20/19 0428  NA 132* 137 132* 135  K 4.1 3.9 3.8 3.7  CL 95* 102 101 101  CO2 _0 GLUCOSE 482* 289* 264* 228*  BUN _1 CREATININE 0.73 0.72 0.66 0.57*  CALCIUM 9.0 8.9 8.4* 8.7*   GFR: Estimated Creatinine Clearance: 123.6 mL/min (A) (by C-G formula based on SCr of 0.57 mg/dL (L)). Liver Function Tests: Recent Labs  Lab 06/18/19 0228  AST 13*  ALT 13  ALKPHOS 178*  BILITOT 0.6  PROT 6.9  ALBUMIN 3.3*    No results for input(s): LIPASE, AMYLASE in the last 168 hours. No results for input(s): AMMONIA in the last 168 hours. Coagulation Profile: No results for input(s): INR, PROTIME in the last 168 hours. Cardiac Enzymes: No results for input(s): CKTOTAL, CKMB, CKMBINDEX, TROPONINI in the last 168 hours. BNP (last 3 results) No results for input(s): PROBNP in the last 8760 hours. HbA1C: Recent Labs    06/18/19 0228  HGBA1C 13.0*   CBG: Recent Labs  Lab 06/19/19 0757 06/19/19 1211 06/19/19 1610 06/19/19 2157 06/20/19 0743  GLUCAP 264* 337* 298* 214* 215*   Lipid Profile: Recent Labs    06/18/19 0228  CHOL 150  HDL 42  LDLCALC 85  TRIG 116  CHOLHDL 3.6   Thyroid Function Tests: Recent Labs    06/18/19 0228  TSH 1.351   Anemia Panel: No results for input(s): VITAMINB12, FOLATE, FERRITIN, TIBC, IRON, RETICCTPCT in the last 72 hours. Sepsis Labs: No results for input(s): PROCALCITON, LATICACIDVEN in the last 168 hours.  Recent Results (from the past 240 hour(s))  UCx     Status: Abnormal   Collection Time: 06/17/19  1:57 PM   Specimen: Urine, Clean Catch  Result Value Ref Range Status   Specimen Description   Final    URINE, CLEAN CATCH Performed at [facility], [place] [address], [place] [address]    Special Requests   Final    NONE Performed at [facility], [place] [address], [place], [place] [address]    Culture (A)  Final    >=100,000 COLONIES/mL METHICILLIN RESISTANT STAPHYLOCOCCUS AUREUS   Report Status 06/19/2019 FINAL  Final   Organism ID, Bacteria METHICILLIN RESISTANT STAPHYLOCOCCUS AUREUS (A)  Final      Susceptibility   Methicillin resistant staphylococcus aureus - MIC*    CIPROFLOXACIN >=8 RESISTANT Resistant     GENTAMICIN <=0.5 SENSITIVE Sensitive     NITROFURANTOIN <=16 SENSITIVE Sensitive     OXACILLIN >=4 RESISTANT Resistant     TETRACYCLINE <=1 SENSITIVE Sensitive     VANCOMYCIN <=0.5 SENSITIVE  Sensitive     TRIMETH/SULFA <=10 SENSITIVE Sensitive     CLINDAMYCIN >=8 RESISTANT Resistant     RIFAMPIN <=0.5 SENSITIVE Sensitive     Inducible Clindamycin NEGATIVE Sensitive     * >=100,000 COLONIES/mL METHICILLIN RESISTANT STAPHYLOCOCCUS AUREUS  SARS Coronavirus 2 by RT PCR (hospital order, performed in [place] hospital lab) Nasopharyngeal Nasopharyngeal Swab     Status: None   Collection Time: 06/17/19  5:55 PM  Specimen: Nasopharyngeal Swab  Result Value Ref Range Status   SARS Coronavirus 2 NEGATIVE NEGATIVE Final    Comment: (NOTE) SARS-CoV-2 target nucleic acids are NOT DETECTED. The SARS-CoV-2 RNA is generally detectable in upper and lower respiratory specimens during the acute phase of infection. The lowest concentration of SARS-CoV-2 viral copies this assay can detect is 250 copies / mL. A negative result does not preclude SARS-CoV-2 infection and should not be used as the sole basis for treatment or other patient management decisions.  A negative result may occur with improper specimen collection / handling, submission of specimen other than nasopharyngeal swab, presence of viral mutation(s) within the areas targeted by this assay, and inadequate number of viral copies (<250 copies / mL). A negative result must be combined with clinical observations, patient history, and epidemiological information. Fact Sheet for Patients:   StrictlyIdeas.no Fact Sheet for Healthcare Providers: BankingDealers.co.za This test is not yet approved or cleared  by the [place] FDA and has been authorized for detection and/or diagnosis of SARS-CoV-2 by FDA under an Emergency Use Authorization (EUA).  This EUA will remain in effect (meaning this test can be used) for the duration of the COVID-19 declaration under Section 564(b)(1) of the Act, 21 U.S.C. section 360bbb-3(b)(1), unless the authorization is terminated or revoked sooner.  Performed at [facility], [place] [address], [place], [place] [address]   blood cx x2     Status: None (Preliminary result)   Collection Time: 06/17/19  6:36 PM   Specimen: BLOOD LEFT FOREARM  Result Value Ref Range Status   Specimen Description   Final    BLOOD LEFT FOREARM Performed at [place] [address] [place], [place] [address]    Special Requests   Final    BOTTLES DRAWN AEROBIC AND ANAEROBIC Blood Culture adequate volume Performed at [place] [address] [place], [place] [address]    Culture   Final    NO GROWTH 2 DAYS Performed at [employer] [address], [place], [place] [address]    Report Status PENDING  Incomplete  blood cx x2     Status: None (Preliminary result)   Collection Time: 06/17/19  6:36 PM   Specimen: BLOOD RIGHT FOREARM  Result Value Ref Range Status   Specimen Description   Final    BLOOD RIGHT FOREARM Performed at [place] [address], [place], [place] [address]    Special Requests   Final    BOTTLES DRAWN AEROBIC AND ANAEROBIC Blood Culture adequate volume Performed at [place] [address] [place], [place] [address]    Culture   Final    NO GROWTH 2 DAYS Performed at [place] [address], [place], [place] [address]    Report Status PENDING  Incomplete  Aerobic Culture (superficial specimen)     Status: None (Preliminary result)   Collection Time: 06/17/19  9:50 PM   Specimen: Wound  Result Value Ref Range Status   Specimen Description WOUND  Final   Special Requests NONE  Final   Gram Stain NO WBC SEEN NO ORGANISMS SEEN   Final   Culture   Final    RARE STAPHYLOCOCCUS AUREUS SUSCEPTIBILITIES TO FOLLOW Performed at [facility], [place] [address], [place], [place] [address]    Report Status PENDING  Incomplete  Aerobic/Anaerobic Culture (surgical/deep wound)     Status: None  (Preliminary result)  Collection Time: 06/17/19  9:50 PM   Specimen: Abscess  Result Value Ref Range Status   Specimen Description   Final    ABSCESS PROSTATE Performed at [facility], [address] [place], [place] [address]    Special Requests   Final    NONE Performed at [facility], [place] [address] [place], [place] [address]    Gram Stain   Final    FEW WBC PRESENT,BOTH PMN AND MONONUCLEAR FEW GRAM POSITIVE COCCI IN CLUSTERS Performed at [facility], [place] [address], [place], [place] [address]    Culture   Final    ABUNDANT METHICILLIN RESISTANT STAPHYLOCOCCUS AUREUS   Report Status PENDING  Incomplete   Organism ID, Bacteria METHICILLIN RESISTANT STAPHYLOCOCCUS AUREUS  Final      Susceptibility   Methicillin resistant staphylococcus aureus - MIC*    CIPROFLOXACIN >=8 RESISTANT Resistant     ERYTHROMYCIN >=8 RESISTANT Resistant     GENTAMICIN <=0.5 SENSITIVE Sensitive     OXACILLIN >=4 RESISTANT Resistant     TETRACYCLINE <=1 SENSITIVE Sensitive     VANCOMYCIN <=0.5 SENSITIVE Sensitive     TRIMETH/SULFA <=10 SENSITIVE Sensitive     CLINDAMYCIN >=8 RESISTANT Resistant     RIFAMPIN <=0.5 SENSITIVE Sensitive     Inducible Clindamycin NEGATIVE Sensitive     * ABUNDANT METHICILLIN RESISTANT STAPHYLOCOCCUS AUREUS      Radiology Studies: No results found.  Scheduled Meds: . Chlorhexidine Gluconate Cloth  6 each Topical Daily  . clindamycin   Topical BID  . heparin  5,000 Units Subcutaneous Q8H  . influenza vac split quadrivalent PF  0.5 mL Intramuscular Tomorrow-1000  . insulin aspart  0-15 Units Subcutaneous TID WC  . insulin aspart  0-5 Units Subcutaneous QHS  . insulin aspart  4 Units Subcutaneous TID WC  . insulin glargine  50 Units Subcutaneous Daily  . loratadine  10 mg Oral Daily  . multivitamin with minerals  1 tablet Oral Daily  . nutrition supplement (JUVEN)  1 packet Oral BID WC  . omega-3 acid ethyl esters  1 g Oral  Daily  . Ensure Max Protein  11 oz Oral BID  . vitamin C  500 mg Oral Daily   Continuous Infusions: . sodium chloride 10 mL/hr at 06/18/19 1915  . sodium chloride irrigation    . vancomycin 1,750 mg (06/20/19 0922)     LOS: 3 days   Time spent: 32 minutes   Darliss Cheney, MD [facility]  06/20/2019, 10:27 AM   To contact the attending provider between 7A-7P or the covering provider during after hours 7P-7A, please log into the web site [email or web address] and use password [email or web address].

## 2019-06-20 NOTE — TOC Initial Note (Signed)
Transition of Care [facility]) - Initial/Assessment Note    Patient Details  Name: Willie Hughes MRN: [id number] Date of Birth: May 15, 1961  Transition of Care [facility] CM/SW Contact:    Dessa Phi, RN Phone Number: 06/20/2019, 5:24 PM  Clinical Narrative: Provided patient w/pcp listing-informed of him having health insurace-he can check w/his insurance for a pcp in network. Also informed of script coverage for meds.No further CM needs.                  Expected Discharge Plan: Home/Self Care Barriers to Discharge: Continued Medical Work up   Patient Goals and CMS Choice        Expected Discharge Plan and Services Expected Discharge Plan: Home/Self Care   Discharge Planning Services: Other - See comment, CM Consult(pcp list)   Living arrangements for the past 2 months: Apartment                                      Prior Living Arrangements/Services Living arrangements for the past 2 months: Apartment Lives with:: Self Patient language and need for interpreter reviewed:: Yes        Need for Family Participation in Patient Care: No (Comment) Care giver support system in place?: Yes (comment)   Criminal Activity/Legal Involvement Pertinent to Current Situation/Hospitalization: No - Comment as needed  Activities of Daily Living Home Assistive Devices/Equipment: CBG Meter, Eyeglasses, Walker (specify type) ADL Screening (condition at time of admission) Patient's cognitive ability adequate to safely complete daily activities?: Yes Is the patient deaf or have difficulty hearing?: No Does the patient have difficulty seeing, even when wearing glasses/contacts?: No Does the patient have difficulty concentrating, remembering, or making decisions?: Yes Patient able to express need for assistance with ADLs?: Yes Does the patient have difficulty dressing or bathing?: No Independently performs ADLs?: Yes (appropriate for developmental age) Does the patient have difficulty  walking or climbing stairs?: Yes Weakness of Legs: Both Weakness of Arms/Hands: Left  Permission Sought/Granted   Permission granted to share information with : Yes, Verbal Permission Granted              Emotional Assessment Appearance:: Appears stated age Attitude/Demeanor/Rapport: Gracious Affect (typically observed): Accepting Orientation: : Oriented to Self, Oriented to Place, Oriented to  Time, Oriented to Situation Alcohol / Substance Use: Not Applicable Psych Involvement: No (comment)  Admission diagnosis:  Urinary tract infection without hematuria, site unspecified [N39.0] Nausea and vomiting, intractability of vomiting not specified, unspecified vomiting type [R11.2] Prostate abscess [N41.2] Patient Active Problem List   Diagnosis Date Noted  . Pressure injury of skin 06/18/2019  . Prostate abscess 06/17/2019  . Hyponatremia 06/17/2019  . Decreased hearing, right 04/03/2018  . Otitis externa due to Pseudomonas aeruginosa 04/03/2018  . Pulmonary nodule 03/13/2018  . Abnormal SPEP 02/26/2018  . Uncontrolled type 2 diabetes mellitus with peripheral neuropathy ([place]) 11/20/2017  . History of tobacco abuse 02/01/2017  . Arthritis 12/20/2016  . Chronic allergic rhinitis 09/20/2016  . Chronic headaches 09/20/2016  . Hypothyroidism 01/28/2016  . Hyperlipidemia 12/15/2015  . Erectile dysfunction 12/15/2015  . Acne vulgaris 12/15/2015  . Bipolar disorder ([place]) 12/15/2015   PCP:  Patient, No Pcp Per Pharmacy:   [place] [address] [place] [address] [place] [address] Phone: [phone] Fax: [place], [place] [address]  [place] [address] Phone: [phone] Fax: [phone]     Social Determinants of Health (SDOH) Interventions    Readmission Risk Interventions No flowsheet data found.

## 2019-06-20 NOTE — Progress Notes (Signed)
Foley catheter removed at 1205. RN will monitor output.

## 2019-06-20 NOTE — Progress Notes (Signed)
Urology Inpatient Progress Report  Urinary tract infection without hematuria, site unspecified [N39.0] Nausea and vomiting, intractability of vomiting not specified, unspecified vomiting type [R11.2] Prostate abscess [N41.2]  Procedure(s): TRANSURETHRAL RESECTION OF THE PROSTATE (TURP)  3 Days Post-Op   Intv/Subj: No acute events overnight. Patient is without complaint. Afebrile vital signs stable.  Urine light yellow  Active Problems:   Hyperlipidemia   Bipolar disorder (HCC)   Hypothyroidism   Chronic allergic rhinitis   Uncontrolled type 2 diabetes mellitus with peripheral neuropathy (HCC)   Prostate abscess   Hyponatremia   Pressure injury of skin  Current Facility-Administered Medications  Medication Dose Route Frequency Provider Last Rate Last Dose  . 0.9 %  sodium chloride infusion   Intravenous Continuous Raiford Noble [place], DO 10 mL/hr at 06/18/19 1601    . acetaminophen (TYLENOL) tablet 650 mg  650 mg Oral Q6H PRN Raiford Noble Latif, DO      . albuterol (PROVENTIL) (2.5 MG/3ML) 0.083% nebulizer solution 2.5 mg  2.5 mg Inhalation Q4H PRN Sheikh, Omair Latif, DO      . bisacodyl (DULCOLAX) suppository 10 mg  10 mg Rectal Daily PRN Raiford Noble Latif, DO      . Chlorhexidine Gluconate Cloth 2 % PADS 6 each  6 each Topical Daily Raiford Noble [place], [place]   6 each at 06/19/19 0932  . clindamycin (CLEOCIN T) 1 % external solution   Topical BID Raiford Noble [place], DO      . heparin injection 5,000 Units  5,000 Units Subcutaneous [address] [place], [place]   5,000 Units at 06/20/19 3557  . influenza vac split quadrivalent PF (FLUARIX) injection 0.5 mL  0.5 mL Intramuscular Tomorrow-1000 Sheikh, Omair Latif, DO      . insulin aspart (novoLOG) injection 0-15 Units  0-15 Units Subcutaneous TID WC Darliss Cheney, MD   5 Units at 06/20/19 0900  . insulin aspart (novoLOG) injection 0-5 Units  0-5 Units Subcutaneous QHS Darliss Cheney, MD   2 Units at 06/19/19 2158  . insulin  aspart (novoLOG) injection 4 Units  4 Units Subcutaneous TID WC Darliss Cheney, MD   4 Units at 06/20/19 0900  . insulin glargine (LANTUS) injection 50 Units  50 Units Subcutaneous Daily Darliss Cheney, MD   50 Units at 06/20/19 [phone]  . loratadine (CLARITIN) tablet 10 mg  10 mg Oral Daily Raiford Noble [place], DO   10 mg at 06/20/19 2542  . multivitamin with minerals tablet 1 tablet  1 tablet Oral Daily Raiford Noble [place], DO   1 tablet at 06/20/19 7062  . nutrition supplement (JUVEN) (JUVEN) powder packet 1 packet  1 packet Oral BID WC Darliss Cheney, MD   1 packet at 06/20/19 0919  . omega-3 acid ethyl esters (LOVAZA) capsule 1 g  1 g Oral Daily Raiford Noble [place], DO   1 g at 06/20/19 3762  . ondansetron (ZOFRAN) tablet 4 mg  4 mg Oral Q6H PRN Raiford Noble Latif, DO       Or  . ondansetron [facility]) injection 4 mg  4 mg Intravenous Q6H PRN Sheikh, Omair Latif, DO      . protein supplement (ENSURE MAX) liquid  11 oz Oral BID Darliss Cheney, MD   11 oz at 06/20/19 0919  . senna-docusate (Senokot-S) tablet 1 tablet  1 tablet Oral QHS PRN Raiford Noble Latif, DO      . sodium chloride irrigation 0.9 % 3,000 mL  3,000 mL Irrigation Continuous Alexis Frock, MD  3,000 mL at 06/18/19 0655  . traMADol (ULTRAM) tablet 50 mg  50 mg Oral Q6H PRN Marguerita MerlesSheikh, Omair [place], DO   50 mg at 06/18/19 [phone]  . vancomycin (VANCOCIN) 1,750 mg in sodium chloride 0.9 % 500 mL IVPB  1,750 mg Intravenous Q12H Danford BadWofford, Drew A, RPH 250 mL/hr at 06/20/19 0922 1,750 mg at 06/20/19 [phone]  . vitamin C (ASCORBIC ACID) tablet 500 mg  500 mg Oral Daily Marguerita MerlesSheikh, Omair [place], DO   500 mg at 06/20/19 0919     Objective: Vital: Vitals:   06/19/19 1125 06/19/19 1447 06/19/19 2159 06/20/19 0517  BP:  (!) 101/46 120/71 130/74  Pulse:  82 79 74  Resp:  16 18 18   Temp:  98.9 F (37.2 C) 99.6 F (37.6 C) 98.6 F (37 C)  TempSrc:  Oral Oral Oral  SpO2: 97% 100% 98% 97%  Weight:    91.9 kg  Height:       I/Os: I/O last 3 completed  shifts: In: 2715.2 [P.O.:980; I.V.:197.3; IV Piggyback:1537.9] Out: 8375 [Urine:8375]  Physical Exam:  General: Patient is in no apparent distress Lungs: Normal respiratory effort, chest expands symmetrically. GI: The abdomen is soft and nontender without mass.  Foley: Draining clear yellow urine Ext: lower extremities symmetric  Lab Results: Recent Labs    06/18/19 0228 06/19/19 0531 06/20/19 0428  WBC 23.3* 19.0* 16.7*  HGB 13.4 11.8* 12.3*  HCT 41.5 37.5* 39.0   Recent Labs    06/18/19 0228 06/19/19 0531 06/20/19 0428  NA 137 132* 135  K 3.9 3.8 3.7  CL 102 101 101  CO2 26 23 26   GLUCOSE 289* 264* 228*  BUN 15 16 12   CREATININE 0.72 0.66 0.57*  CALCIUM 8.9 8.4* 8.7*   No results for input(s): LABPT, INR in the last 72 hours. No results for input(s): LABURIN in the last 72 hours. Results for orders placed or performed during the hospital encounter of 06/17/19  UCx     Status: Abnormal   Collection Time: 06/17/19  1:57 PM   Specimen: Urine, Clean Catch  Result Value Ref Range Status   Specimen Description   Final    URINE, CLEAN CATCH Performed at [facility], [address] [place], [place] [address]    Special Requests   Final    NONE Performed at [facility], [address] [place], [place] [address]    Culture (A)  Final    >=100,000 COLONIES/mL METHICILLIN RESISTANT STAPHYLOCOCCUS AUREUS   Report Status 06/19/2019 FINAL  Final   Organism ID, Bacteria METHICILLIN RESISTANT STAPHYLOCOCCUS AUREUS (A)  Final      Susceptibility   Methicillin resistant staphylococcus aureus - MIC*    CIPROFLOXACIN >=8 RESISTANT Resistant     GENTAMICIN <=0.5 SENSITIVE Sensitive     NITROFURANTOIN <=16 SENSITIVE Sensitive     OXACILLIN >=4 RESISTANT Resistant     TETRACYCLINE <=1 SENSITIVE Sensitive     VANCOMYCIN <=0.5 SENSITIVE Sensitive     TRIMETH/SULFA <=10 SENSITIVE Sensitive     CLINDAMYCIN >=8 RESISTANT Resistant      RIFAMPIN <=0.5 SENSITIVE Sensitive     Inducible Clindamycin NEGATIVE Sensitive     * >=100,000 COLONIES/mL METHICILLIN RESISTANT STAPHYLOCOCCUS AUREUS  SARS Coronavirus 2 by RT PCR (hospital order, performed in [facility] hospital lab) Nasopharyngeal Nasopharyngeal Swab     Status: None   Collection Time: 06/17/19  5:55 PM   Specimen: Nasopharyngeal Swab  Result Value Ref Range Status  SARS Coronavirus 2 NEGATIVE NEGATIVE Final    Comment: (NOTE) SARS-CoV-2 target nucleic acids are NOT DETECTED. The SARS-CoV-2 RNA is generally detectable in upper and lower respiratory specimens during the acute phase of infection. The lowest concentration of SARS-CoV-2 viral copies this assay can detect is 250 copies / mL. A negative result does not preclude SARS-CoV-2 infection and should not be used as the sole basis for treatment or other patient management decisions.  A negative result may occur with improper specimen collection / handling, submission of specimen other than nasopharyngeal swab, presence of viral mutation(s) within the areas targeted by this assay, and inadequate number of viral copies (<250 copies / mL). A negative result must be combined with clinical observations, patient history, and epidemiological information. Fact Sheet for Patients:   BoilerBrush.com.cy Fact Sheet for Healthcare Providers: https://pope.com/ This test is not yet approved or cleared  by the [place] FDA and has been authorized for detection and/or diagnosis of SARS-CoV-2 by FDA under an Emergency Use Authorization (EUA).  This EUA will remain in effect (meaning this test can be used) for the duration of the COVID-19 declaration under Section 564(b)(1) of the Act, 21 U.S.C. section 360bbb-3(b)(1), unless the authorization is terminated or revoked sooner. Performed at [facility], [address] [place], [place] [address]   blood cx  x2     Status: None (Preliminary result)   Collection Time: 06/17/19  6:36 PM   Specimen: BLOOD LEFT FOREARM  Result Value Ref Range Status   Specimen Description   Final    BLOOD LEFT FOREARM Performed at [facility], [address] [place], [place] [address]    Special Requests   Final    BOTTLES DRAWN AEROBIC AND ANAEROBIC Blood Culture adequate volume Performed at [facility], [address], [place], [place] [address]    Culture   Final    NO GROWTH 2 DAYS Performed at [facility], [address] [place], [place] [address]    Report Status PENDING  Incomplete  blood cx x2     Status: None (Preliminary result)   Collection Time: 06/17/19  6:36 PM   Specimen: BLOOD RIGHT FOREARM  Result Value Ref Range Status   Specimen Description   Final    BLOOD RIGHT FOREARM Performed at [facility], [address], [place], [place] [address]    Special Requests   Final    BOTTLES DRAWN AEROBIC AND ANAEROBIC Blood Culture adequate volume Performed at [facility], [address] [place], [place] [address]    Culture   Final    NO GROWTH 2 DAYS Performed at [facility], [address], [place], [place] [address]    Report Status PENDING  Incomplete  Aerobic Culture (superficial specimen)     Status: None (Preliminary result)   Collection Time: 06/17/19  9:50 PM   Specimen: Wound  Result Value Ref Range Status   Specimen Description WOUND  Final   Special Requests NONE  Final   Gram Stain NO WBC SEEN NO ORGANISMS SEEN   Final   Culture   Final    RARE STAPHYLOCOCCUS AUREUS SUSCEPTIBILITIES TO FOLLOW Performed at [facility], [address], [place], [place] [address]    Report Status PENDING  Incomplete  Aerobic/Anaerobic Culture (surgical/deep wound)     Status: None (Preliminary result)   Collection Time: 06/17/19  9:50 PM   Specimen: Abscess  Result Value Ref Range Status    Specimen Description   Final    ABSCESS PROSTATE Performed at [facility], [address], [place], [place] [address]    Special Requests   Final    NONE Performed at [facility], [address], [place], [place] [address]    Gram Stain   Final    FEW WBC PRESENT,BOTH PMN AND MONONUCLEAR FEW GRAM POSITIVE COCCI IN CLUSTERS Performed at [facility], [address] [place], [place] [address]    Culture   Final    ABUNDANT METHICILLIN RESISTANT STAPHYLOCOCCUS AUREUS   Report Status PENDING  Incomplete   Organism ID, Bacteria METHICILLIN RESISTANT STAPHYLOCOCCUS AUREUS  Final      Susceptibility   Methicillin resistant staphylococcus aureus - MIC*    CIPROFLOXACIN >=8 RESISTANT Resistant     ERYTHROMYCIN >=8 RESISTANT Resistant     GENTAMICIN <=0.5 SENSITIVE Sensitive     OXACILLIN >=4 RESISTANT Resistant     TETRACYCLINE <=1 SENSITIVE Sensitive     VANCOMYCIN <=0.5 SENSITIVE Sensitive     TRIMETH/SULFA <=10 SENSITIVE Sensitive     CLINDAMYCIN >=8 RESISTANT Resistant     RIFAMPIN <=0.5 SENSITIVE Sensitive     Inducible Clindamycin NEGATIVE Sensitive     * ABUNDANT METHICILLIN RESISTANT STAPHYLOCOCCUS AUREUS    Studies/Results: No results found.  Assessment: Prostate abscess  Procedure(s): TRANSURETHRAL RESECTION OF THE PROSTATE (TURP), 3 Days Post-Op  doing well.  Plan: Antibiotics per primary team.  I will discontinue the Foley catheter.   Modena Slater, MD Urology 06/20/2019, 11:40 AM

## 2019-06-21 DIAGNOSIS — E871 Hypo-osmolality and hyponatremia: Secondary | ICD-10-CM

## 2019-06-21 LAB — CBC WITH DIFFERENTIAL/PLATELET
Abs Immature Granulocytes: 0.13 10*3/uL — ABNORMAL HIGH (ref 0.00–0.07)
Basophils Absolute: 0.1 10*3/uL (ref 0.0–0.1)
Basophils Relative: 1 %
Eosinophils Absolute: 0.3 10*3/uL (ref 0.0–0.5)
Eosinophils Relative: 2 %
HCT: 40.5 % (ref 39.0–52.0)
Hemoglobin: 12.5 g/dL — ABNORMAL LOW (ref 13.0–17.0)
Immature Granulocytes: 1 %
Lymphocytes Relative: 13 %
Lymphs Abs: 1.6 10*3/uL (ref 0.7–4.0)
MCH: 29.1 pg (ref 26.0–34.0)
MCHC: 30.9 g/dL (ref 30.0–36.0)
MCV: 94.4 fL (ref 80.0–100.0)
Monocytes Absolute: 1.1 10*3/uL — ABNORMAL HIGH (ref 0.1–1.0)
Monocytes Relative: 9 %
Neutro Abs: 9.4 10*3/uL — ABNORMAL HIGH (ref 1.7–7.7)
Neutrophils Relative %: 74 %
Platelets: 309 10*3/uL (ref 150–400)
RBC: 4.29 MIL/uL (ref 4.22–5.81)
RDW: 12 % (ref 11.5–15.5)
WBC: 12.5 10*3/uL — ABNORMAL HIGH (ref 4.0–10.5)
nRBC: 0 % (ref 0.0–0.2)

## 2019-06-21 LAB — BASIC METABOLIC PANEL
Anion gap: 8 (ref 5–15)
BUN: 17 mg/dL (ref 6–20)
CO2: 28 mmol/L (ref 22–32)
Calcium: 8.9 mg/dL (ref 8.9–10.3)
Chloride: 102 mmol/L (ref 98–111)
Creatinine, Ser: 0.66 mg/dL (ref 0.61–1.24)
GFR calc Af Amer: >60 mL/min (ref >60)
GFR calc non Af Amer: >60 mL/min (ref >60)
Glucose, Bld: 184 mg/dL — ABNORMAL HIGH (ref 70–99)
Potassium: 3.9 mmol/L (ref 3.5–5.1)
Sodium: 138 mmol/L (ref 135–145)

## 2019-06-21 LAB — AEROBIC CULTURE W GRAM STAIN (SUPERFICIAL SPECIMEN): Gram Stain: NONE SEEN

## 2019-06-21 LAB — GLUCOSE, CAPILLARY
Glucose-Capillary: 138 mg/dL — ABNORMAL HIGH (ref 70–99)
Glucose-Capillary: 347 mg/dL — ABNORMAL HIGH (ref 70–99)

## 2019-06-21 MED ORDER — TRESIBA FLEXTOUCH 200 UNIT/ML ~~LOC~~ SOPN: 30.0000 [IU] | PEN_INJECTOR | Freq: Two times a day (BID) | SUBCUTANEOUS | 1 refills | Status: AC

## 2019-06-21 MED ORDER — SULFAMETHOXAZOLE-TRIMETHOPRIM 800-160 MG PO TABS: 1.0000 | ORAL_TABLET | Freq: Two times a day (BID) | ORAL | 0 refills | Status: AC

## 2019-06-21 NOTE — Discharge Instructions (Signed)
Please take the prescribed Zofran as needed for nausea.  Please take the antibiotics as prescribed for the urinary tract infection.  If you develops further vomiting, fever, worsening abdominal pain, please return to ER for reassessment.  Please follow-up with your primary doctor for recheck later this week. Please take your antibiotics as prescribed.  Not taking antibiotics may put you at risk of worsening infection and sepsis and may cause significant complications including but not limited to death.

## 2019-06-21 NOTE — Progress Notes (Signed)
Upon assessment of patient, this nurse questioned the open areas on patient's buttocks, as they were charted as pressure injuries. 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 Skin elsewhere on buttocks, sacrum is intact with no concerns. Patient is ambulatory and does not appear to be at risk for development of pressure injuries. Correction made in documentation.

## 2019-06-21 NOTE — Progress Notes (Signed)
Urology Inpatient Progress Report  Urinary tract infection without hematuria, site unspecified [N39.0] Nausea and vomiting, intractability of vomiting not specified, unspecified vomiting type [R11.2] Prostate abscess [N41.2]  Procedure(s): TRANSURETHRAL RESECTION OF THE PROSTATE (TURP)  4 Days Post-Op   Intv/Subj: No acute events overnight.  Leukocytosis continues to improve.  Afebrile.  Vital signs stable.  He does complain about some right-sided testicular swelling.  He also has some pain on the right side.  He is voiding well.  Planning to be discharged home on Bactrim.  Active Problems:   Hyperlipidemia   Bipolar disorder (HCC)   Hypothyroidism   Chronic allergic rhinitis   Uncontrolled type 2 diabetes mellitus with peripheral neuropathy (HCC)   Prostate abscess   Hyponatremia   Pressure injury of skin  Current Facility-Administered Medications  Medication Dose Route Frequency Provider Last Rate Last Dose  . 0.9 %  sodium chloride infusion   Intravenous Continuous Raiford Noble [place], DO 10 mL/hr at 06/18/19 2671    . acetaminophen (TYLENOL) tablet 650 mg  650 mg Oral Q6H PRN Raiford Noble Latif, DO      . albuterol (PROVENTIL) (2.5 MG/3ML) 0.083% nebulizer solution 2.5 mg  2.5 mg Inhalation Q4H PRN Sheikh, Omair Latif, DO      . bisacodyl (DULCOLAX) suppository 10 mg  10 mg Rectal Daily PRN Raiford Noble Latif, DO      . Chlorhexidine Gluconate Cloth 2 % PADS 6 each  6 each Topical Daily Raiford Noble [place], DO   6 each at 06/20/19 0900  . clindamycin (CLEOCIN T) 1 % external solution   Topical BID Raiford Noble [place], DO      . heparin injection 5,000 Units  5,000 Units Subcutaneous [address] [place], [place]   5,000 Units at 06/21/19 2458  . influenza vac split quadrivalent PF (FLUARIX) injection 0.5 mL  0.5 mL Intramuscular Tomorrow-1000 Sheikh, Omair Latif, DO      . insulin aspart (novoLOG) injection 0-15 Units  0-15 Units Subcutaneous TID WC Darliss Cheney, MD   5 Units  at 06/20/19 1722  . insulin aspart (novoLOG) injection 0-5 Units  0-5 Units Subcutaneous QHS Darliss Cheney, MD   2 Units at 06/20/19 2245  . insulin aspart (novoLOG) injection 4 Units  4 Units Subcutaneous TID WC Darliss Cheney, MD   4 Units at 06/20/19 1723  . insulin glargine (LANTUS) injection 30 Units  30 Units Subcutaneous BID Darliss Cheney, MD   30 Units at 06/20/19 2129  . loratadine (CLARITIN) tablet 10 mg  10 mg Oral Daily Raiford Noble [place], DO   10 mg at 06/20/19 0998  . multivitamin with minerals tablet 1 tablet  1 tablet Oral Daily Raiford Noble [place], DO   1 tablet at 06/20/19 3382  . nutrition supplement (JUVEN) (JUVEN) powder packet 1 packet  1 packet Oral BID WC Darliss Cheney, MD   1 packet at 06/20/19 0919  . omega-3 acid ethyl esters (LOVAZA) capsule 1 g  1 g Oral Daily Raiford Noble [place], DO   1 g at 06/20/19 5053  . ondansetron (ZOFRAN) tablet 4 mg  4 mg Oral Q6H PRN Raiford Noble Latif, DO       Or  . ondansetron [facility]) injection 4 mg  4 mg Intravenous Q6H PRN Sheikh, Omair Latif, DO      . protein supplement (ENSURE MAX) liquid  11 oz Oral BID Darliss Cheney, MD   11 oz at 06/20/19 2127  . senna-docusate (Senokot-S) tablet 1 tablet  1  tablet Oral QHS PRN Marguerita Merles Latif, DO      . sodium chloride irrigation 0.9 % 3,000 mL  3,000 mL Irrigation Continuous Sebastian Ache, MD   3,000 mL at 06/18/19 0655  . sulfamethoxazole-trimethoprim (BACTRIM DS) 800-160 MG per tablet 1 tablet  1 tablet Oral Q12H Hughie Closs, MD   1 tablet at 06/20/19 2127  . traMADol (ULTRAM) tablet 50 mg  50 mg Oral Q6H PRN Marguerita Merles Jal, DO   50 mg at 06/18/19 1610  . vancomycin (VANCOCIN) 1,750 mg in sodium chloride 0.9 % 500 mL IVPB  1,750 mg Intravenous Q12H Danford Bad, RPH 250 mL/hr at 06/20/19 2133 1,750 mg at 06/20/19 2133  . vitamin C (ASCORBIC ACID) tablet 500 mg  500 mg Oral Daily Marguerita Merles [place], DO   500 mg at 06/20/19 0919     Objective: Vital: Vitals:   06/20/19  0517 06/20/19 1516 06/20/19 2138 06/21/19 0445  BP: 130/74 123/65 127/77 117/74  Pulse: 74 75 77 66  Resp: Temp: 98.6 F (37 C) 98 F (36.7 C) 99.5 F (37.5 C) 99.6 F (37.6 C)  TempSrc: Oral Oral Oral Oral  SpO2: 97% 97% 97% 95%  Weight: 91.9 kg   87.8 kg  Height:       I/Os: I/O last 3 completed shifts: In: 4371 [P.O.:1600; I.V.:289.3; IV Piggyback:2481.7] Out: 5680 [Urine:5680]  Physical Exam:  General: Patient is in no apparent distress Lungs: Normal respiratory effort, chest expands symmetrically. GI:The abdomen is soft and nontender without mass. Ext: lower extremities symmetric Genitourinary: Normal phallus.  Right testicle with swelling and overlying erythema.  No evidence of gangrenous infection.  No evidence of hernia or abscess.  Exam consistent with orchitis with likely reactive hydrocele.  Lab Results: Recent Labs    06/19/19 0531 06/20/19 0428 06/21/19 0503  WBC 19.0* 16.7* 12.5*  HGB 11.8* 12.3* 12.5*  HCT 37.5* 39.0 40.5   Recent Labs    06/19/19 0531 06/20/19 0428 06/21/19 0503  NA 132* 135 138  K 3.8 3.7 3.9  CL 101 101 102  CO2 GLUCOSE 264* 228* 184*  BUN CREATININE 0.66 0.57* 0.66  CALCIUM 8.4* 8.7* 8.9   No results for input(s): LABPT, INR in the last 72 hours. No results for input(s): LABURIN in the last 72 hours. Results for orders placed or performed during the hospital encounter of 06/17/19  UCx     Status: Abnormal   Collection Time: 06/17/19  1:57 PM   Specimen: Urine, Clean Catch  Result Value Ref Range Status   Specimen Description   Final    URINE, CLEAN CATCH Performed at [facility], [address] [place], [place] [address]    Special Requests   Final    NONE Performed at [facility], [address] [place], [place] [address]    Culture (A)  Final    >=100,000 COLONIES/mL METHICILLIN RESISTANT STAPHYLOCOCCUS AUREUS   Report Status 06/19/2019  FINAL  Final   Organism ID, Bacteria METHICILLIN RESISTANT STAPHYLOCOCCUS AUREUS (A)  Final      Susceptibility   Methicillin resistant staphylococcus aureus - MIC*    CIPROFLOXACIN >=8 RESISTANT Resistant     GENTAMICIN <=0.5 SENSITIVE Sensitive     NITROFURANTOIN <=16 SENSITIVE Sensitive     OXACILLIN >=4 RESISTANT Resistant     TETRACYCLINE <=1 SENSITIVE Sensitive     VANCOMYCIN <=0.5 SENSITIVE Sensitive  TRIMETH/SULFA <=10 SENSITIVE Sensitive     CLINDAMYCIN >=8 RESISTANT Resistant     RIFAMPIN <=0.5 SENSITIVE Sensitive     Inducible Clindamycin NEGATIVE Sensitive     * >=100,000 COLONIES/mL METHICILLIN RESISTANT STAPHYLOCOCCUS AUREUS  SARS Coronavirus 2 by RT PCR (hospital order, performed in [facility] hospital lab) Nasopharyngeal Nasopharyngeal Swab     Status: None   Collection Time: 06/17/19  5:55 PM   Specimen: Nasopharyngeal Swab  Result Value Ref Range Status   SARS Coronavirus 2 NEGATIVE NEGATIVE Final    Comment: (NOTE) SARS-CoV-2 target nucleic acids are NOT DETECTED. The SARS-CoV-2 RNA is generally detectable in upper and lower respiratory specimens during the acute phase of infection. The lowest concentration of SARS-CoV-2 viral copies this assay can detect is 250 copies / mL. A negative result does not preclude SARS-CoV-2 infection and should not be used as the sole basis for treatment or other patient management decisions.  A negative result may occur with improper specimen collection / handling, submission of specimen other than nasopharyngeal swab, presence of viral mutation(s) within the areas targeted by this assay, and inadequate number of viral copies (<250 copies / mL). A negative result must be combined with clinical observations, patient history, and epidemiological information. Fact Sheet for Patients:   BoilerBrush.com.cy Fact Sheet for Healthcare Providers: https://pope.com/ This test is not yet  approved or cleared  by the [place] FDA and has been authorized for detection and/or diagnosis of SARS-CoV-2 by FDA under an Emergency Use Authorization (EUA).  This EUA will remain in effect (meaning this test can be used) for the duration of the COVID-19 declaration under Section 564(b)(1) of the Act, 21 U.S.C. section 360bbb-3(b)(1), unless the authorization is terminated or revoked sooner. Performed at [facility], [address], [place], [place] [address]   blood cx x2     Status: None (Preliminary result)   Collection Time: 06/17/19  6:36 PM   Specimen: BLOOD LEFT FOREARM  Result Value Ref Range Status   Specimen Description   Final    BLOOD LEFT FOREARM Performed at [facility], [address] [place], [place] [address]    Special Requests   Final    BOTTLES DRAWN AEROBIC AND ANAEROBIC Blood Culture adequate volume Performed at [facility], [address] [place], [place] [address]    Culture   Final    NO GROWTH 3 DAYS Performed at [facility], [address] [place], [place] [address]    Report Status PENDING  Incomplete  blood cx x2     Status: None (Preliminary result)   Collection Time: 06/17/19  6:36 PM   Specimen: BLOOD RIGHT FOREARM  Result Value Ref Range Status   Specimen Description   Final    BLOOD RIGHT FOREARM Performed at [facility], [address], [place], [place] [address]    Special Requests   Final    BOTTLES DRAWN AEROBIC AND ANAEROBIC Blood Culture adequate volume Performed at [facility] [address], [place], [place] [address]    Culture   Final    NO GROWTH 3 DAYS Performed at [facility], [address] [place], [place] [address]    Report Status PENDING  Incomplete  Aerobic Culture (superficial specimen)     Status: None (Preliminary result)   Collection Time: 06/17/19  9:50 PM   Specimen: Wound  Result  Value Ref Range Status   Specimen Description  WOUND  Final   Special Requests NONE  Final   Gram Stain NO WBC SEEN NO ORGANISMS SEEN   Final   Culture   Final    RARE STAPHYLOCOCCUS AUREUS SUSCEPTIBILITIES TO FOLLOW Performed at [facility], [address] [place], [place] [address]    Report Status PENDING  Incomplete  Aerobic/Anaerobic Culture (surgical/deep wound)     Status: None (Preliminary result)   Collection Time: 06/17/19  9:50 PM   Specimen: Abscess  Result Value Ref Range Status   Specimen Description   Final    ABSCESS PROSTATE Performed at [facility], [address] [place], [place] [address]    Special Requests   Final    NONE Performed at [facility], [address] [place], [place] [address]    Gram Stain   Final    FEW WBC PRESENT,BOTH PMN AND MONONUCLEAR FEW GRAM POSITIVE COCCI IN CLUSTERS Performed at [facility], [address] [place], [place] [address]    Culture   Final    ABUNDANT METHICILLIN RESISTANT STAPHYLOCOCCUS AUREUS NO ANAEROBES ISOLATED; CULTURE IN PROGRESS FOR 5 DAYS    Report Status PENDING  Incomplete   Organism ID, Bacteria METHICILLIN RESISTANT STAPHYLOCOCCUS AUREUS  Final      Susceptibility   Methicillin resistant staphylococcus aureus - MIC*    CIPROFLOXACIN >=8 RESISTANT Resistant     ERYTHROMYCIN >=8 RESISTANT Resistant     GENTAMICIN <=0.5 SENSITIVE Sensitive     OXACILLIN >=4 RESISTANT Resistant     TETRACYCLINE <=1 SENSITIVE Sensitive     VANCOMYCIN <=0.5 SENSITIVE Sensitive     TRIMETH/SULFA <=10 SENSITIVE Sensitive     CLINDAMYCIN >=8 RESISTANT Resistant     RIFAMPIN <=0.5 SENSITIVE Sensitive     Inducible Clindamycin NEGATIVE Sensitive     * ABUNDANT METHICILLIN RESISTANT STAPHYLOCOCCUS AUREUS    Studies/Results: No results found.  Assessment: Prostate abscess Right epididymoorchitis  Procedure(s): TRANSURETHRAL RESECTION OF THE PROSTATE (TURP), 4 Days Post-Op   doing well.  Plan: Okay for discharge on Bactrim from my standpoint.   Modena SlaterEugene Bell, MD Urology 06/21/2019, 9:11 AM

## 2019-06-21 NOTE — Evaluation (Signed)
Physical Therapy Evaluation Patient Details Name: Willie Hughes MRN: [id number] DOB: 04-24-1961 Today's Date: 06/21/2019   History of Present Illness  58 y.o. male with medical history significant for but not limited to noncompliance with medications, acne, allergic rhinitis, allergies, bipolar disorder, COPD, ED, heart disease, hypothyroidism, hyperlipidemia, type 2 diabetes mellitus  who presents with chief complaint of feeling unwell and malaise and had some nausea vomiting and urinary frequency for last 18 hours. CT abdomen and pelvis with contrast showed possible right-sided prostate abscess with involvement of the right seminal vesicle/seminal vasculitis or seminal vesicle abscess.  Urology was consulted. pt s/p TURP on 06/17/19  Clinical Impression  Patient evaluated by Physical Therapy with no further acute PT needs identified. All education has been completed and the patient has no further questions.  Pt with significant amount of scrotal pain, provided mesh briefs and educated pt on not wearing boxers at home but rather underwear that provide scrotal support, moving scrotum anteriorly to avoid pressure in sitting and scooting in bed as well as providing a "ramp" for scrotal elevation when in supine.  Pt appreciative, was able to amb in hallway with decr pain with briefs in place.  See below for any follow-up Physical Therapy or equipment needs. PT is signing off. Thank you for this referral.     Follow Up Recommendations No PT follow up    Equipment Recommendations  None recommended by PT    Recommendations for Other Services       Precautions / Restrictions Restrictions Weight Bearing Restrictions: No      Mobility  Bed Mobility Overal bed mobility: Modified Independent             General bed mobility comments: incr time to elevate trunk  Transfers Overall transfer level: Needs assistance Equipment used: Rolling walker (2 wheeled) Transfers: Sit to/from Stand Sit  to Stand: Supervision;Min guard         General transfer comment: for safety  Ambulation/Gait Ambulation/Gait assistance: Supervision;Min guard Gait Distance (Feet): 160 Feet(10' to bathroom) Assistive device: Rolling walker (2 wheeled) Gait Pattern/deviations: Step-through pattern;Decreased stride length;Wide base of support     General Gait Details: cues for RW distance from self, trunk extension, wide BOS d/t scrotal pain  Stairs            Wheelchair Mobility    Modified Rankin (Stroke Patients Only)       Balance Overall balance assessment: Needs assistance   Sitting balance-Leahy Scale: Good       Standing balance-Leahy Scale: Fair Standing balance comment: reliant on UEs for dynamic tasks                             Pertinent Vitals/Pain Pain Assessment: Faces Faces Pain Scale: Hurts even more Pain Location: scrotum Pain Descriptors / Indicators: Grimacing;Sore Pain Intervention(s): Monitored during session;Limited activity within patient's tolerance;Repositioned;Other (comment)(see comments)    Home Living Family/patient expects to be discharged to:: Private residence Living Arrangements: Alone   Type of Home: [place]: One level        Prior Function Level of Independence: Independent         Comments: has RW that is wide and tall, does not use it regularly     Hand Dominance        Extremity/Trunk Assessment   Upper Extremity Assessment Upper Extremity Assessment: Overall WFL for tasks assessed    Lower  Extremity Assessment Lower Extremity Assessment: Overall WFL for tasks assessed       Communication   Communication: No difficulties  Cognition Arousal/Alertness: Awake/alert Behavior During Therapy: WFL for tasks assessed/performed Overall Cognitive Status: Within Functional Limits for tasks assessed                                        General Comments       Exercises     Assessment/Plan    PT Assessment Patent does not need any further PT services  PT Problem List         PT Treatment Interventions      PT Goals (Current goals can be found in the Care Plan section)  Acute Rehab PT Goals PT Goal Formulation: All assessment and education complete, DC therapy Potential to Achieve Goals: Good    Frequency     Barriers to discharge        Co-evaluation               AM-PAC PT "6 Clicks" Mobility  Outcome Measure Help needed turning from your back to your side while in a flat bed without using bedrails?: None Help needed moving from lying on your back to sitting on the side of a flat bed without using bedrails?: None Help needed moving to and from a bed to a chair (including a wheelchair)?: None Help needed standing up from a chair using your arms (e.g., wheelchair or bedside chair)?: A Little Help needed to walk in hospital room?: A Little Help needed climbing 3-5 steps with a railing? : A Little 6 Click Score: 21    End of Session Equipment Utilized During Treatment: Gait belt Activity Tolerance: Patient tolerated treatment well Patient left: with call bell/phone within reach;with family/visitor present;in chair;with chair alarm set   PT Visit Diagnosis: Difficulty in walking, not elsewhere classified (R26.2)    Time: 0037-0488 PT Time Calculation (min) (ACUTE ONLY): 23 min   Charges:   PT Evaluation $PT Eval Low Complexity: 1 Low PT Treatments $Gait Training: 8-22 mins        Drucilla Chalet, PT  Pager: [phone] Acute Rehab Dept [facility]): [phone]   06/21/2019   [facility] 06/21/2019, 2:35 PM

## 2019-06-21 NOTE — Discharge Summary (Signed)
Physician Discharge Summary  Willie Hughes VEH:[id number] DOB: 02-Aug-1960 DOA: 06/17/2019  PCP: Patient, No Pcp Per  Admit date: 06/17/2019 Discharge date: 06/21/2019  Admitted From: Home Disposition: Home  Recommendations for Outpatient Follow-up:  1. Follow up with PCP in 1-2 weeks 2. Please obtain BMP/CBC in one week 3. Please follow up on the following pending results:  Home Health: None Equipment/Devices: None  Discharge Condition: Stable CODE STATUS: Full code Diet recommendation: Cardiac  Subjective: Seen and examined.  He has no complaints.  Brief/Interim Summary: Willie Hughes a 58 y.o.malewith medical history significantfor but not limited to noncompliance with medications, acne, allergic rhinitis, allergies, bipolar disorder, COPD, ED, heart disease, hypothyroidism, hyperlipidemia, type 2 diabetes mellitus  who presents with chief complaint of feeling unwell and malaise and had some nausea vomiting and urinary frequency for last 18 hours prior to presentation to ED.  No other complaint, fever or chills.  Upon arrival to emergency room, he was hemodynamically stable.  CT abdomen and pelvis with contrast showed possible right-sided prostate abscess with involvement of the right seminal vesicle/seminal vasculitis or seminal vesicle abscess.  Urology was consulted and patient was hospitalized under hospitalist service.  He received 2 L of IV fluid in the emergency department and IV Rocephin.  Repeat Covid testing was negative.  He was taken to the OR by urology and underwent transection of prostate with unroofing of prostate abscess.  Antibiotics were escalated to ciprofloxacin, clindamycin and gentamicin.  Subsequently his urine culture and prostate abscess culture grew MRSA.  Antibiotics were switched to vancomycin on 06/19/2019.  Final susceptibilities came back later that day and per my discussion with infectious disease specialist, Dr. Luciana Hughes, he was placed on Bactrim DS on  06/20/2019.  He recommended total of 21 days.  Patient's Foley catheter was removed on 06/20/2019 and he had several voidings since then.  He has been cleared by urology.  He also came in with significant hyperglycemia requiring Lantus which was escalated to 30 units twice daily.  After discussion with patient, he told me that he has not been taking any of his medications for the last 6 to 9 months including insulin.  He tells me that his hypothyroidism was secondary to lithium so he had stopped it and he also stopped taking lithium.  I had a long discussion with over clinical social worker to see if we could help him somehow to be able to buy his medications.  I was informed that since patient has his insurance so he is responsible to pay his co-pay.  I have prescribed him Lantus 30 units twice daily at this point in time as well as 20 more days of Bactrim DS.  I have reiterated several times with him how important it is for him to take those medications and he verbalized understanding.  Discharge Diagnoses:  Active Problems:   Hyperlipidemia   Bipolar disorder (HCC)   Hypothyroidism   Chronic allergic rhinitis   Uncontrolled type 2 diabetes mellitus with peripheral neuropathy (HCC)   Prostate abscess   Hyponatremia   Pressure injury of skin    Discharge Instructions  Discharge Instructions    Discharge patient   Complete by: As directed    Discharge disposition: 01-Home or Self Care   Discharge patient date: 06/21/2019     Allergies as of 06/21/2019      Reactions   Kava Kava    Rash    Aluminum Rash   Aluminum compounds in antiperspirant.    Other Rash  Some antiperspirants cause a rash      Medication List    STOP taking these medications   doxycycline 100 MG tablet Commonly known as: VIBRA-TABS   levothyroxine 75 MCG tablet Commonly known as: SYNTHROID     TAKE these medications   acetaminophen 325 MG tablet Commonly known as: TYLENOL Take 650 mg by mouth every 6  (six) hours as needed.   albuterol 108 (90 Base) MCG/ACT inhaler Commonly known as: VENTOLIN HFA Inhale 2 puffs into the lungs every 4 (four) hours as needed for wheezing or shortness of breath.   aspirin 325 MG tablet Take 325 mg by mouth every 6 (six) hours as needed. Pt is taking 325mg  TID   cephALEXin 500 MG capsule Commonly known as: KEFLEX Take 1 capsule (500 mg total) by mouth 3 (three) times daily for 7 days.   clindamycin 1 % external solution Commonly known as: CLEOCIN T Apply topically 2 (two) times daily.   EQL FISH OIL PO Take 1,000 mg by mouth 3 (three) times daily.   famotidine 20 MG tablet Commonly known as: PEPCID Take 1 tablet (20 mg total) by mouth 2 (two) times daily.   Fenofibrate 150 MG Caps Take 1 capsule (150 mg total) by mouth daily.   fexofenadine 180 MG tablet Commonly known as: ALLEGRA Take 180 mg by mouth daily.   glucose blood test strip Commonly known as: ONE TOUCH TEST STRIPS Use as instructed to test blood sugar 3 times daily   insulin aspart 100 UNIT/ML FlexPen Commonly known as: NovoLOG FlexPen Inject 15-18 Units into the skin 3 (three) times daily with meals.   Insulin Syringe-Needle U-100 31G X 5/16" 1 ML Misc Commonly known as: RELION INSULIN SYRINGE 1ML/31G Use with insulin twice daily as directed.   lithium carbonate 300 MG capsule Take 3 capsules (900 mg total) by mouth daily.   multivitamin tablet Take 1 tablet by mouth daily.   ondansetron 4 MG disintegrating tablet Commonly known as: Zofran ODT Take 1 tablet (4 mg total) by mouth every 8 (eight) hours as needed for nausea or vomiting.   OneTouch Delica Lancets 33G Misc Use as instructed to test blood sugar 3 times daily   sildenafil 100 MG tablet Commonly known as: VIAGRA Take 1 tablet (100 mg total) by mouth daily as needed for erectile dysfunction.   simvastatin 40 MG tablet Commonly known as: ZOCOR Take 1 tablet (40 mg total) by mouth at bedtime.    sulfamethoxazole-trimethoprim 800-160 MG tablet Commonly known as: BACTRIM DS Take 1 tablet by mouth every 12 (twelve) hours for 20 days.   Willie Hughes FlexTouch 200 UNIT/ML Sopn Generic drug: Insulin Degludec Inject 30 Units into the skin 2 (two) times daily. What changed:   how much to take  when to take this   tretinoin 0.05 % cream Commonly known as: RETIN-A APPLY  CREAM TOPICALLY TO AFFECTED AREA AS NEEDED   vitamin B-12 500 MCG tablet Commonly known as: CYANOCOBALAMIN Take 500 mcg by mouth daily.   vitamin C 500 MG tablet Commonly known as: ASCORBIC ACID Take 500 mg by mouth daily.      Follow-up Information    [facility]   [facility]   Specialty: Emergency Medicine Why: As needed, If symptoms worsen Contact information: [address] [id number] [place] [phone]         Allergies  Allergen Reactions  . Kava Kava     Rash   . Aluminum Rash  Aluminum compounds in antiperspirant.   . Other Rash    Some antiperspirants cause a rash    Consultations: Urology   Procedures/Studies: Ct Abdomen Pelvis W Contrast  Result Date: 06/17/2019 CLINICAL DATA:  58 year old male with history of acute generalized abdominal pain and vomiting. EXAM: CT ABDOMEN AND PELVIS WITH CONTRAST TECHNIQUE: Multidetector CT imaging of the abdomen and pelvis was performed using the standard protocol following bolus administration of intravenous contrast. CONTRAST:  OMNIPAQUE IOHEXOL 300 MG/ML  SOLN COMPARISON:  CT the abdomen and pelvis 12/20/2018. FINDINGS: Lower chest: Unremarkable. Hepatobiliary: No suspicious cystic or solid hepatic lesions. No intra or extrahepatic biliary ductal dilatation. Gallbladder is normal in appearance. Pancreas: No pancreatic mass. No pancreatic ductal dilatation. No pancreatic or peripancreatic fluid collections or inflammatory changes. Spleen: Unremarkable. Adrenals/Urinary  Tract: Bilateral kidneys and bilateral adrenal glands are normal in appearance. No hydroureteronephrosis. Urinary bladder is normal in appearance. Stomach/Bowel: Normal appearance of the stomach. No pathologic dilatation of small bowel or colon. Normal appendix. Vascular/Lymphatic: Aortic atherosclerosis, without evidence of aneurysm or dissection in the abdominal or pelvic vasculature. No lymphadenopathy noted in the abdomen or pelvis. Reproductive: In the right-side of the prostate gland there is a well-defined 2.6 x 2.0 x 2.8 cm low-attenuation lesion (axial image 87 of series 3 and coronal image 85 of series 4), concerning for a prosthetic abscess. There is also asymmetric enlargement of the right seminal vesicles with subtle surrounding inflammatory changes. Other: No significant volume of ascites.  No pneumoperitoneum. Musculoskeletal: There are no aggressive appearing lytic or blastic lesions noted in the visualized portions of the skeleton. IMPRESSION: 1. Findings are concerning for probable right-sided prostate abscess with involvement of the right seminal vesicle (either seminal vesiculitis or seminal vesicle abscess). Urologic consultation is recommended. 2. Aortic atherosclerosis. Electronically Signed   By: Trudie Reed M.D.   On: 06/17/2019 16:45     Discharge Exam: Vitals:   06/20/19 2138 06/21/19 0445  BP: 127/77 117/74  Pulse: 77 66  Resp: 18 16  Temp: 99.5 F (37.5 C) 99.6 F (37.6 C)  SpO2: 97% 95%   Vitals:   06/20/19 0517 06/20/19 1516 06/20/19 2138 06/21/19 0445  BP: 130/74 123/65 127/77 117/74  Pulse: 74 75 77 66  Resp: 18 18 18 16   Temp: 98.6 F (37 C) 98 F (36.7 C) 99.5 F (37.5 C) 99.6 F (37.6 C)  TempSrc: Oral Oral Oral Oral  SpO2: 97% 97% 97% 95%  Weight: 91.9 kg   87.8 kg  Height:        General: Pt is alert, awake, not in acute distress Cardiovascular: RRR, S1/S2 +, no rubs, no gallops Respiratory: CTA bilaterally, no wheezing, no  rhonchi Abdominal: Soft, NT, ND, bowel sounds + Extremities: no edema, no cyanosis    The results of significant diagnostics from this hospitalization (including imaging, microbiology, ancillary and laboratory) are listed below for reference.     Microbiology: Recent Results (from the past 240 hour(s))  UCx     Status: Abnormal   Collection Time: 06/17/19  1:57 PM   Specimen: Urine, Clean Catch  Result Value Ref Range Status   Specimen Description   Final    URINE, CLEAN CATCH Performed at [facility], [address], [place], [place] [address]    Special Requests   Final    NONE Performed at [facility], [address], [place], [place] [address]    Culture (A)  Final    >=100,000 COLONIES/mL  METHICILLIN RESISTANT STAPHYLOCOCCUS AUREUS   Report Status 06/19/2019 FINAL  Final   Organism ID, Bacteria METHICILLIN RESISTANT STAPHYLOCOCCUS AUREUS (A)  Final      Susceptibility   Methicillin resistant staphylococcus aureus - MIC*    CIPROFLOXACIN >=8 RESISTANT Resistant     GENTAMICIN <=0.5 SENSITIVE Sensitive     NITROFURANTOIN <=16 SENSITIVE Sensitive     OXACILLIN >=4 RESISTANT Resistant     TETRACYCLINE <=1 SENSITIVE Sensitive     VANCOMYCIN <=0.5 SENSITIVE Sensitive     TRIMETH/SULFA <=10 SENSITIVE Sensitive     CLINDAMYCIN >=8 RESISTANT Resistant     RIFAMPIN <=0.5 SENSITIVE Sensitive     Inducible Clindamycin NEGATIVE Sensitive     * >=100,000 COLONIES/mL METHICILLIN RESISTANT STAPHYLOCOCCUS AUREUS  SARS Coronavirus 2 by RT PCR (hospital order, performed in [facility] hospital lab) Nasopharyngeal Nasopharyngeal Swab     Status: None   Collection Time: 06/17/19  5:55 PM   Specimen: Nasopharyngeal Swab  Result Value Ref Range Status   SARS Coronavirus 2 NEGATIVE NEGATIVE Final    Comment: (NOTE) SARS-CoV-2 target nucleic acids are NOT DETECTED. The SARS-CoV-2 RNA is generally detectable in upper and lower respiratory specimens  during the acute phase of infection. The lowest concentration of SARS-CoV-2 viral copies this assay can detect is 250 copies / mL. A negative result does not preclude SARS-CoV-2 infection and should not be used as the sole basis for treatment or other patient management decisions.  A negative result may occur with improper specimen collection / handling, submission of specimen other than nasopharyngeal swab, presence of viral mutation(s) within the areas targeted by this assay, and inadequate number of viral copies (<250 copies / mL). A negative result must be combined with clinical observations, patient history, and epidemiological information. Fact Sheet for Patients:   BoilerBrush.com.cy Fact Sheet for Healthcare Providers: https://pope.com/ This test is not yet approved or cleared  by the [place] FDA and has been authorized for detection and/or diagnosis of SARS-CoV-2 by FDA under an Emergency Use Authorization (EUA).  This EUA will remain in effect (meaning this test can be used) for the duration of the COVID-19 declaration under Section 564(b)(1) of the Act, 21 U.S.C. section 360bbb-3(b)(1), unless the authorization is terminated or revoked sooner. Performed at [facility], [address], [place], [place] [address]   blood cx x2     Status: None (Preliminary result)   Collection Time: 06/17/19  6:36 PM   Specimen: BLOOD LEFT FOREARM  Result Value Ref Range Status   Specimen Description   Final    BLOOD LEFT FOREARM Performed at [facility], [address], [place], [place] [address]    Special Requests   Final    BOTTLES DRAWN AEROBIC AND ANAEROBIC Blood Culture adequate volume Performed at [facility], [address] [place], [place] [address]    Culture   Final    NO GROWTH 4 DAYS Performed at [facility], [address], [place], [place] [address]     Report Status PENDING  Incomplete  blood cx x2     Status: None (Preliminary result)   Collection Time: 06/17/19  6:36 PM   Specimen: BLOOD RIGHT FOREARM  Result Value Ref Range Status   Specimen Description   Final    BLOOD RIGHT FOREARM Performed at [facility], [address], [place], [place] [address]    Special Requests   Final    BOTTLES DRAWN AEROBIC  AND ANAEROBIC Blood Culture adequate volume Performed at [facility], [address], [place], [place] [address]    Culture   Final    NO GROWTH 4 DAYS Performed at [facility], [address], [place], [place] [address]    Report Status PENDING  Incomplete  Aerobic Culture (superficial specimen)     Status: None   Collection Time: 06/17/19  9:50 PM   Specimen: Wound  Result Value Ref Range Status   Specimen Description WOUND  Final   Special Requests NONE  Final   Gram Stain   Final    NO WBC SEEN NO ORGANISMS SEEN Performed at [facility], [address] [place], [place] [address]    Culture RARE METHICILLIN RESISTANT STAPHYLOCOCCUS AUREUS  Final   Report Status 06/21/2019 FINAL  Final   Organism ID, Bacteria METHICILLIN RESISTANT STAPHYLOCOCCUS AUREUS  Final      Susceptibility   Methicillin resistant staphylococcus aureus - MIC*    CIPROFLOXACIN >=8 RESISTANT Resistant     ERYTHROMYCIN >=8 RESISTANT Resistant     GENTAMICIN <=0.5 SENSITIVE Sensitive     OXACILLIN >=4 RESISTANT Resistant     TETRACYCLINE <=1 SENSITIVE Sensitive     VANCOMYCIN 1 SENSITIVE Sensitive     TRIMETH/SULFA <=10 SENSITIVE Sensitive     CLINDAMYCIN >=8 RESISTANT Resistant     RIFAMPIN <=0.5 SENSITIVE Sensitive     Inducible Clindamycin NEGATIVE Sensitive     * RARE METHICILLIN RESISTANT STAPHYLOCOCCUS AUREUS  Aerobic/Anaerobic Culture (surgical/deep wound)     Status: None (Preliminary result)   Collection Time: 06/17/19  9:50 PM   Specimen: Abscess  Result Value Ref Range Status    Specimen Description   Final    ABSCESS PROSTATE Performed at [facility], [address] [place], [place] [address]    Special Requests   Final    NONE Performed at [facility], [address] [place], [place] [address]    Gram Stain   Final    FEW WBC PRESENT,BOTH PMN AND MONONUCLEAR FEW GRAM POSITIVE COCCI IN CLUSTERS Performed at [facility], [address], [place], [place] [address]    Culture   Final    ABUNDANT METHICILLIN RESISTANT STAPHYLOCOCCUS AUREUS NO ANAEROBES ISOLATED; CULTURE IN PROGRESS FOR 5 DAYS    Report Status PENDING  Incomplete   Organism ID, Bacteria METHICILLIN RESISTANT STAPHYLOCOCCUS AUREUS  Final      Susceptibility   Methicillin resistant staphylococcus aureus - MIC*    CIPROFLOXACIN >=8 RESISTANT Resistant     ERYTHROMYCIN >=8 RESISTANT Resistant     GENTAMICIN <=0.5 SENSITIVE Sensitive     OXACILLIN >=4 RESISTANT Resistant     TETRACYCLINE <=1 SENSITIVE Sensitive     VANCOMYCIN <=0.5 SENSITIVE Sensitive     TRIMETH/SULFA <=10 SENSITIVE Sensitive     CLINDAMYCIN >=8 RESISTANT Resistant     RIFAMPIN <=0.5 SENSITIVE Sensitive     Inducible Clindamycin NEGATIVE Sensitive     * ABUNDANT METHICILLIN RESISTANT STAPHYLOCOCCUS AUREUS     Labs: BNP (last 3 results) No results for input(s): BNP in the last 8760 hours. Basic Metabolic Panel: Recent Labs  Lab 06/17/19 1357 06/18/19 0228 06/19/19 0531 06/20/19 0428 06/21/19 0503  NA 132* 137 132* 135 138  K 4.1 3.9 3.8 3.7 3.9  CL 95* 102 101 101 102  CO2 GLUCOSE 482* 289* 264* 228* 184*  BUN 12  17  CREATININE 0.73 0.72 0.66 0.57* 0.66  CALCIUM 9.0 8.9 8.4* 8.7* 8.9   Liver Function Tests: Recent Labs  Lab 06/18/19 0228  AST 13*  ALT 13  ALKPHOS 178*  BILITOT 0.6  PROT 6.9  ALBUMIN 3.3*   No results for input(s): LIPASE, AMYLASE in the last 168 hours. No results for input(s): AMMONIA in the last 168 hours. CBC: Recent  Labs  Lab 06/17/19 1357 06/18/19 0228 06/19/19 0531 06/20/19 0428 06/21/19 0503  WBC 25.0* 23.3* 19.0* 16.7* 12.5*  NEUTROABS 22.9*  --  15.2* 13.2* 9.4*  HGB 14.6 13.4 11.8* 12.3* 12.5*  HCT 44.3 41.5 37.5* 39.0 40.5  MCV 91.2 92.4 93.8 94.4 94.4  PLT 274 284 233 232 309   Cardiac Enzymes: No results for input(s): CKTOTAL, CKMB, CKMBINDEX, TROPONINI in the last 168 hours. BNP: Invalid input(s): POCBNP CBG: Recent Labs  Lab 06/20/19 1217 06/20/19 1609 06/20/19 2136 06/21/19 0800 06/21/19 1212  GLUCAP 278* 239* 222* 138* 347*   D-Dimer No results for input(s): DDIMER in the last 72 hours. Hgb A1c No results for input(s): HGBA1C in the last 72 hours. Lipid Profile No results for input(s): CHOL, HDL, LDLCALC, TRIG, CHOLHDL, LDLDIRECT in the last 72 hours. Thyroid function studies No results for input(s): TSH, T4TOTAL, T3FREE, THYROIDAB in the last 72 hours.  Invalid input(s): FREET3 Anemia work up No results for input(s): VITAMINB12, FOLATE, FERRITIN, TIBC, IRON, RETICCTPCT in the last 72 hours. Urinalysis    Component Value Date/Time   COLORURINE STRAW (A) 06/17/2019 1357   APPEARANCEUR HAZY (A) 06/17/2019 1357   LABSPEC 1.028 06/17/2019 1357   PHURINE 7.0 06/17/2019 1357   GLUCOSEU >=500 (A) 06/17/2019 1357   HGBUR NEGATIVE 06/17/2019 1357   BILIRUBINUR NEGATIVE 06/17/2019 1357   BILIRUBINUR neg 11/01/2017 1544   KETONESUR 20 (A) 06/17/2019 1357   PROTEINUR NEGATIVE 06/17/2019 1357   UROBILINOGEN 0.2 11/01/2017 1544   NITRITE NEGATIVE 06/17/2019 1357   LEUKOCYTESUR MODERATE (A) 06/17/2019 1357   Sepsis Labs Invalid input(s): PROCALCITONIN,  WBC,  LACTICIDVEN Microbiology Recent Results (from the past 240 hour(s))  UCx     Status: Abnormal   Collection Time: 06/17/19  1:57 PM   Specimen: Urine, Clean Catch  Result Value Ref Range Status   Specimen Description   Final    URINE, CLEAN CATCH Performed at [facility], [address] [place], [place] [address]    Special Requests   Final    NONE Performed at [facility], [address] [place], [place] [address]    Culture (A)  Final    >=100,000 COLONIES/mL METHICILLIN RESISTANT STAPHYLOCOCCUS AUREUS   Report Status 06/19/2019 FINAL  Final   Organism ID, Bacteria METHICILLIN RESISTANT STAPHYLOCOCCUS AUREUS (A)  Final      Susceptibility   Methicillin resistant staphylococcus aureus - MIC*    CIPROFLOXACIN >=8 RESISTANT Resistant     GENTAMICIN <=0.5 SENSITIVE Sensitive     NITROFURANTOIN <=16 SENSITIVE Sensitive     OXACILLIN >=4 RESISTANT Resistant     TETRACYCLINE <=1 SENSITIVE Sensitive     VANCOMYCIN <=0.5 SENSITIVE Sensitive     TRIMETH/SULFA <=10 SENSITIVE Sensitive     CLINDAMYCIN >=8 RESISTANT Resistant     RIFAMPIN <=0.5 SENSITIVE Sensitive     Inducible Clindamycin NEGATIVE Sensitive     * >=100,000 COLONIES/mL METHICILLIN RESISTANT STAPHYLOCOCCUS AUREUS  SARS Coronavirus 2 by RT PCR (hospital order, performed in [facility] hospital lab) Nasopharyngeal Nasopharyngeal Swab  Status: None   Collection Time: 06/17/19  5:55 PM   Specimen: Nasopharyngeal Swab  Result Value Ref Range Status   SARS Coronavirus 2 NEGATIVE NEGATIVE Final    Comment: (NOTE) SARS-CoV-2 target nucleic acids are NOT DETECTED. The SARS-CoV-2 RNA is generally detectable in upper and lower respiratory specimens during the acute phase of infection. The lowest concentration of SARS-CoV-2 viral copies this assay can detect is 250 copies / mL. A negative result does not preclude SARS-CoV-2 infection and should not be used as the sole basis for treatment or other patient management decisions.  A negative result may occur with improper specimen collection / handling, submission of specimen other than nasopharyngeal swab, presence of viral mutation(s) within the areas targeted by this assay, and inadequate number of viral copies (<250 copies / mL). A negative  result must be combined with clinical observations, patient history, and epidemiological information. Fact Sheet for Patients:   BoilerBrush.com.cy Fact Sheet for Healthcare Providers: https://pope.com/ This test is not yet approved or cleared  by the [place] FDA and has been authorized for detection and/or diagnosis of SARS-CoV-2 by FDA under an Emergency Use Authorization (EUA).  This EUA will remain in effect (meaning this test can be used) for the duration of the COVID-19 declaration under Section 564(b)(1) of the Act, 21 U.S.C. section 360bbb-3(b)(1), unless the authorization is terminated or revoked sooner. Performed at [facility], [address], [place], [place] [address]   blood cx x2     Status: None (Preliminary result)   Collection Time: 06/17/19  6:36 PM   Specimen: BLOOD LEFT FOREARM  Result Value Ref Range Status   Specimen Description   Final    BLOOD LEFT FOREARM Performed at [facility], [address], [place], [place] [address]    Special Requests   Final    BOTTLES DRAWN AEROBIC AND ANAEROBIC Blood Culture adequate volume Performed at [facility], [address], [place], [place] [address]    Culture   Final    NO GROWTH 4 DAYS Performed at [facility], [address], [place], [place] [address]    Report Status PENDING  Incomplete  blood cx x2     Status: None (Preliminary result)   Collection Time: 06/17/19  6:36 PM   Specimen: BLOOD RIGHT FOREARM  Result Value Ref Range Status   Specimen Description   Final    BLOOD RIGHT FOREARM Performed at [facility], [address], [place], [place] [address]    Special Requests   Final    BOTTLES DRAWN AEROBIC AND ANAEROBIC Blood Culture adequate volume Performed at [facility], [address] [place], [place] [address]    Culture   Final    NO  GROWTH 4 DAYS Performed at [facility], [address], [place], [place] [address]    Report Status PENDING  Incomplete  Aerobic Culture (superficial specimen)     Status: None   Collection Time: 06/17/19  9:50 PM   Specimen: Wound  Result Value Ref Range Status   Specimen Description WOUND  Final   Special Requests NONE  Final   Gram Stain   Final    NO WBC SEEN NO ORGANISMS SEEN Performed at [facility], [address] [place], [place] [address]    Culture RARE METHICILLIN RESISTANT STAPHYLOCOCCUS AUREUS  Final   Report Status 06/21/2019 FINAL  Final   Organism ID, Bacteria  METHICILLIN RESISTANT STAPHYLOCOCCUS AUREUS  Final      Susceptibility   Methicillin resistant staphylococcus aureus - MIC*    CIPROFLOXACIN >=8 RESISTANT Resistant     ERYTHROMYCIN >=8 RESISTANT Resistant     GENTAMICIN <=0.5 SENSITIVE Sensitive     OXACILLIN >=4 RESISTANT Resistant     TETRACYCLINE <=1 SENSITIVE Sensitive     VANCOMYCIN 1 SENSITIVE Sensitive     TRIMETH/SULFA <=10 SENSITIVE Sensitive     CLINDAMYCIN >=8 RESISTANT Resistant     RIFAMPIN <=0.5 SENSITIVE Sensitive     Inducible Clindamycin NEGATIVE Sensitive     * RARE METHICILLIN RESISTANT STAPHYLOCOCCUS AUREUS  Aerobic/Anaerobic Culture (surgical/deep wound)     Status: None (Preliminary result)   Collection Time: 06/17/19  9:50 PM   Specimen: Abscess  Result Value Ref Range Status   Specimen Description   Final    ABSCESS PROSTATE Performed at [facility], [place] [address] [place], [place] [address]    Special Requests   Final    NONE Performed at [facility], [place] [address] [place], [place] [address]    Gram Stain   Final    FEW WBC PRESENT,BOTH PMN AND MONONUCLEAR FEW GRAM POSITIVE COCCI IN CLUSTERS Performed at [facility], [place] [address], [place], [place] [address]    Culture   Final    ABUNDANT METHICILLIN RESISTANT STAPHYLOCOCCUS AUREUS NO ANAEROBES ISOLATED; CULTURE IN  PROGRESS FOR 5 DAYS    Report Status PENDING  Incomplete   Organism ID, Bacteria METHICILLIN RESISTANT STAPHYLOCOCCUS AUREUS  Final      Susceptibility   Methicillin resistant staphylococcus aureus - MIC*    CIPROFLOXACIN >=8 RESISTANT Resistant     ERYTHROMYCIN >=8 RESISTANT Resistant     GENTAMICIN <=0.5 SENSITIVE Sensitive     OXACILLIN >=4 RESISTANT Resistant     TETRACYCLINE <=1 SENSITIVE Sensitive     VANCOMYCIN <=0.5 SENSITIVE Sensitive     TRIMETH/SULFA <=10 SENSITIVE Sensitive     CLINDAMYCIN >=8 RESISTANT Resistant     RIFAMPIN <=0.5 SENSITIVE Sensitive     Inducible Clindamycin NEGATIVE Sensitive     * ABUNDANT METHICILLIN RESISTANT STAPHYLOCOCCUS AUREUS     Time coordinating discharge: Over 30 minutes  SIGNED:   Darliss Cheney, MD  [facility] 06/21/2019, 2:07 PM  If 7PM-7AM, please contact night-coverage [email or web address] Password [email or web address]

## 2019-06-22 LAB — CULTURE, BLOOD (ROUTINE X 2)
Culture: NO GROWTH
Culture: NO GROWTH
Special Requests: ADEQUATE
Special Requests: ADEQUATE

## 2019-06-23 ENCOUNTER — Other Ambulatory Visit: Payer: Self-pay

## 2019-06-23 LAB — AEROBIC/ANAEROBIC CULTURE W GRAM STAIN (SURGICAL/DEEP WOUND)

## 2019-06-23 NOTE — Anesthesia Postprocedure Evaluation (Signed)
Anesthesia Post Note  Patient: Willie Hughes  Procedure(s) Performed: TRANSURETHRAL RESECTION OF THE PROSTATE (TURP) (N/A )     Patient location during evaluation: PACU Anesthesia Type: General Level of consciousness: awake and alert Pain management: pain level controlled Vital Signs Assessment: post-procedure vital signs reviewed and stable Respiratory status: spontaneous breathing, nonlabored ventilation and respiratory function stable Cardiovascular status: blood pressure returned to baseline and stable Postop Assessment: no apparent nausea or vomiting Anesthetic complications: no    Last Vitals:  Vitals:   06/20/19 2138 06/21/19 0445  BP: 127/77 117/74  Pulse: 77 66  Resp: 18 16  Temp: 37.5 C 37.6 C  SpO2: 97% 95%    Last Pain:  Vitals:   06/21/19 0930  TempSrc:   PainSc: 0-No pain                 Audry Pili

## 2019-06-24 LAB — SURGICAL PATHOLOGY

## 2019-06-25 ENCOUNTER — Telehealth: Payer: PPO

## 2019-06-25 ENCOUNTER — Encounter (HOSPITAL_COMMUNITY): Payer: Self-pay

## 2019-06-27 ENCOUNTER — Telehealth: Payer: Self-pay | Admitting: General Practice

## 2019-06-27 NOTE — Telephone Encounter (Signed)
Called patient this morning and left vm about appt on 07/02/2019 with Dr. Bryan Lemma. At this time she is not accepting new patients. I am going to cancel appt with Dr. Loletha Grayer.

## 2019-07-02 ENCOUNTER — Ambulatory Visit: Payer: PPO | Admitting: Family Medicine

## 2019-12-10 ENCOUNTER — Other Ambulatory Visit: Payer: Self-pay | Admitting: Internal Medicine

## 2019-12-10 DIAGNOSIS — IMO0002 Reserved for concepts with insufficient information to code with codable children: Secondary | ICD-10-CM

## 2020-02-03 ENCOUNTER — Encounter (INDEPENDENT_AMBULATORY_CARE_PROVIDER_SITE_OTHER): Payer: PPO | Admitting: Ophthalmology

## 2020-02-26 ENCOUNTER — Emergency Department (HOSPITAL_COMMUNITY)
Admission: EM | Admit: 2020-02-26 | Discharge: 2020-02-26 | Disposition: A | Discharge status: Home / Self Care | Payer: Medicare Other | Attending: Emergency Medicine | Admitting: Emergency Medicine

## 2020-02-26 ENCOUNTER — Encounter (HOSPITAL_COMMUNITY): Payer: Self-pay | Admitting: Emergency Medicine

## 2020-02-26 DIAGNOSIS — R339 Retention of urine, unspecified: Secondary | ICD-10-CM | POA: Diagnosis not present

## 2020-02-26 DIAGNOSIS — R338 Other retention of urine: Secondary | ICD-10-CM

## 2020-02-26 DIAGNOSIS — Z20822 Contact with and (suspected) exposure to covid-19: Secondary | ICD-10-CM | POA: Insufficient documentation

## 2020-02-26 DIAGNOSIS — K5900 Constipation, unspecified: Secondary | ICD-10-CM | POA: Insufficient documentation

## 2020-02-26 DIAGNOSIS — J449 Chronic obstructive pulmonary disease, unspecified: Secondary | ICD-10-CM | POA: Diagnosis not present

## 2020-02-26 DIAGNOSIS — R300 Dysuria: Secondary | ICD-10-CM | POA: Diagnosis present

## 2020-02-26 DIAGNOSIS — Z7951 Long term (current) use of inhaled steroids: Secondary | ICD-10-CM | POA: Insufficient documentation

## 2020-02-26 DIAGNOSIS — Z79899 Other long term (current) drug therapy: Secondary | ICD-10-CM | POA: Diagnosis not present

## 2020-02-26 DIAGNOSIS — E119 Type 2 diabetes mellitus without complications: Secondary | ICD-10-CM | POA: Insufficient documentation

## 2020-02-26 DIAGNOSIS — Z794 Long term (current) use of insulin: Secondary | ICD-10-CM | POA: Diagnosis not present

## 2020-02-26 DIAGNOSIS — E039 Hypothyroidism, unspecified: Secondary | ICD-10-CM | POA: Diagnosis not present

## 2020-02-26 DIAGNOSIS — Z7982 Long term (current) use of aspirin: Secondary | ICD-10-CM | POA: Insufficient documentation

## 2020-02-26 DIAGNOSIS — Z87891 Personal history of nicotine dependence: Secondary | ICD-10-CM | POA: Insufficient documentation

## 2020-02-26 LAB — URINALYSIS, ROUTINE W REFLEX MICROSCOPIC
Bacteria, UA: NONE SEEN
Bilirubin Urine: NEGATIVE
Glucose, UA: >=500 mg/dL — AB
Hgb urine dipstick: NEGATIVE
Ketones, ur: NEGATIVE mg/dL
Leukocytes,Ua: NEGATIVE
Nitrite: NEGATIVE
Protein, ur: NEGATIVE mg/dL
Specific Gravity, Urine: 1.024 (ref 1.005–1.030)
pH: 5 (ref 5.0–8.0)

## 2020-02-26 LAB — BASIC METABOLIC PANEL
Anion gap: 11 (ref 5–15)
BUN: 14 mg/dL (ref 6–20)
CO2: 25 mmol/L (ref 22–32)
Calcium: 9.7 mg/dL (ref 8.9–10.3)
Chloride: 103 mmol/L (ref 98–111)
Creatinine, Ser: 0.62 mg/dL (ref 0.61–1.24)
GFR calc Af Amer: >60 mL/min (ref >60)
GFR calc non Af Amer: >60 mL/min (ref >60)
Glucose, Bld: 254 mg/dL — ABNORMAL HIGH (ref 70–99)
Potassium: 3.6 mmol/L (ref 3.5–5.1)
Sodium: 139 mmol/L (ref 135–145)

## 2020-02-26 LAB — CBC
HCT: 42.9 % (ref 39.0–52.0)
Hemoglobin: 14.4 g/dL (ref 13.0–17.0)
MCH: 31.1 pg (ref 26.0–34.0)
MCHC: 33.6 g/dL (ref 30.0–36.0)
MCV: 92.7 fL (ref 80.0–100.0)
Platelets: 290 10*3/uL (ref 150–400)
RBC: 4.63 MIL/uL (ref 4.22–5.81)
RDW: 12.3 % (ref 11.5–15.5)
WBC: 16.2 10*3/uL — ABNORMAL HIGH (ref 4.0–10.5)
nRBC: 0 % (ref 0.0–0.2)

## 2020-02-26 LAB — SARS CORONAVIRUS 2 BY RT PCR (HOSPITAL ORDER, PERFORMED IN ~~LOC~~ HOSPITAL LAB): SARS Coronavirus 2: NEGATIVE

## 2020-02-26 MED ORDER — CEPHALEXIN 500 MG PO CAPS: 500.0000 mg | ORAL_CAPSULE | Freq: Once | ORAL | Status: DC

## 2020-02-26 MED ORDER — POLYETHYLENE GLYCOL 3350 17 G PO PACK: 17.0000 g | PACK | Freq: Every day | ORAL | Status: DC

## 2020-02-26 MED ORDER — CEPHALEXIN 500 MG PO CAPS: 500.0000 mg | ORAL_CAPSULE | Freq: Four times a day (QID) | ORAL | 0 refills | Status: DC

## 2020-02-26 MED ORDER — TAMSULOSIN HCL 0.4 MG PO CAPS: 0.4000 mg | ORAL_CAPSULE | Freq: Every day | ORAL | 0 refills | Status: DC

## 2020-02-26 MED ORDER — POLYETHYLENE GLYCOL 3350 17 G PO PACK: 17.0000 g | PACK | Freq: Every day | ORAL | 0 refills | Status: DC

## 2020-02-26 MED ORDER — TAMSULOSIN HCL 0.4 MG PO CAPS
0.4000 mg | ORAL_CAPSULE | ORAL | Status: AC
  Administered 2020-02-26: 0.4 mg via ORAL
  Filled 2020-02-26: qty 1

## 2020-02-26 MED ORDER — LIDOCAINE HCL URETHRAL/MUCOSAL 2 % EX GEL: 1.0000 "application " | Freq: Once | CUTANEOUS | Status: DC

## 2020-02-26 MED ORDER — TAMSULOSIN HCL 0.4 MG PO CAPS: 0.4000 mg | ORAL_CAPSULE | Freq: Every day | ORAL | 0 refills | Status: AC

## 2020-02-26 MED ORDER — SODIUM CHLORIDE 0.9 % IV SOLN
1.0000 g | Freq: Once | INTRAVENOUS | Status: AC
  Administered 2020-02-26: 1 g via INTRAVENOUS
  Filled 2020-02-26: qty 10

## 2020-02-26 NOTE — ED Notes (Signed)
Urology at bedside.

## 2020-02-26 NOTE — ED Provider Notes (Signed)
[facility]-EMERGENCY DEPT Provider Note   CSN: [id number] Arrival date & time: 02/26/20  0220     History Chief Complaint  Patient presents with  . Urinary Retention  . Constipation    Willie Hughes is a 59 y.o. male.  The history is provided by the patient.  Illness Location:  Bladder Quality:  Unable to urinate since this afternoon Severity:  Moderate Onset quality:  Gradual Timing:  Constant Progression:  Worsening Chronicity:  New Context:  S/p TURP on 06/17/19 Relieved by:  Nothing Worsened by:  Nothing Ineffective treatments:  None tried  Associated symptoms: no abdominal pain, no chest pain, no congestion, no cough, no diarrhea, no ear pain, no fatigue, no fever, no headaches, no loss of consciousness, no myalgias, no nausea, no rash, no rhinorrhea, no shortness of breath, no sore throat, no vomiting and no wheezing   Risk factors:  S/p TURP Also believes he is constipated.       Past Medical History:  Diagnosis Date  . Acne   . Allergic rhinitis   . Allergy   . Bipolar disorder (HCC)    managed by Psych- Dr. Michae Kava  . COPD (chronic obstructive pulmonary disease) (HCC)   . Erectile dysfunction   . Frequent headaches   . Heart disease   . Heart murmur   . Hyperlipidemia   . Hypothyroidism   . Pulmonary nodule 02/2018   Found on CT screen, Follow up due 02/2019  . Type 2 diabetes mellitus (HCC)    managed by Dr. Elvera Lennox.    Patient Active Problem List   Diagnosis Date Noted  . Pressure injury of skin 06/18/2019  . Prostate abscess 06/17/2019  . Hyponatremia 06/17/2019  . Decreased hearing, right 04/03/2018  . Otitis externa due to Pseudomonas aeruginosa 04/03/2018  . Pulmonary nodule 03/13/2018  . Abnormal SPEP 02/26/2018  . Uncontrolled type 2 diabetes mellitus with peripheral neuropathy (HCC) 11/20/2017  . History of tobacco abuse 02/01/2017  . Arthritis 12/20/2016  . Chronic allergic rhinitis 09/20/2016  . Chronic headaches  09/20/2016  . Hypothyroidism 01/28/2016  . Hyperlipidemia 12/15/2015  . Erectile dysfunction 12/15/2015  . Acne vulgaris 12/15/2015  . Bipolar disorder (HCC) 12/15/2015    Past Surgical History:  Procedure Laterality Date  . TRANSURETHRAL RESECTION OF PROSTATE N/A 06/17/2019   Procedure: TRANSURETHRAL RESECTION OF THE PROSTATE (TURP);  Surgeon: Sebastian Ache, MD;  Location: [facility] ORS;  Service: Urology;  Laterality: N/A;  . WISDOM TOOTH EXTRACTION         Family History  Problem Relation Age of Onset  . Heart attack Father   . Stroke Father   . Mental illness Father   . Heart disease Father   . Drug abuse Father   . Depression Father   . Alcohol abuse Father   . Arthritis Father   . Stroke Mother   . Diabetes Mother   . Depression Mother   . Arthritis Mother   . Mental illness Mother   . Depression Sister   . Lupus Sister   . Bipolar disorder Paternal Aunt   . Lupus Sister   . Lupus Brother     Social History   Tobacco Use  . Smoking status: Former Smoker    Packs/day: 2.00    Years: 35.00    Pack years: 70.00    Types: Cigarettes  . Smokeless tobacco: Never Used  . Tobacco comment: Vapor  Vaping Use  . Vaping Use: Former  . Start date: 03/07/2013  .  Quit date: 08/24/2017  Substance Use Topics  . Alcohol use: Yes    Alcohol/week: 0.0 standard drinks    Comment: a few beers a month  . Drug use: Not Currently    Types: Marijuana    Home Medications Prior to Admission medications   Medication Sig Start Date End Date Taking? Authorizing Provider  acetaminophen (TYLENOL) 325 MG tablet Take 650 mg by mouth every 6 (six) hours as needed.    [provider]  albuterol (PROVENTIL HFA;VENTOLIN HFA) 108 (90 Base) MCG/ACT inhaler Inhale 2 puffs into the lungs every 4 (four) hours as needed for wheezing or shortness of breath. Patient not taking: Reported on 10/23/2018 02/02/17   Doreene Nestlark, Katherine K, NP  aspirin 325 MG tablet Take 325 mg by mouth every 6 (six)  hours as needed. Pt is taking 325mg  TID    [provider]  clindamycin (CLEOCIN T) 1 % external solution Apply topically 2 (two) times daily. 10/24/18   Kuneff, Renee A, DO  famotidine (PEPCID) 20 MG tablet Take 1 tablet (20 mg total) by mouth 2 (two) times daily. Patient not taking: Reported on 06/17/2019 07/10/18   Felix PaciniKuneff, Renee A, DO  Fenofibrate 150 MG CAPS Take 1 capsule (150 mg total) by mouth daily. Patient not taking: Reported on 06/17/2019 03/08/18   Felix PaciniKuneff, Renee A, DO  fexofenadine (ALLEGRA) 180 MG tablet Take 180 mg by mouth daily.     [provider]  glucose blood (ONE TOUCH TEST STRIPS) test strip Use as instructed to test blood sugar 3 times daily 07/31/18   Carlus PavlovGherghe, Cristina, MD  insulin aspart (NOVOLOG FLEXPEN) 100 UNIT/ML FlexPen Inject 15-18 Units into the skin 3 (three) times daily with meals. Patient not taking: Reported on 06/17/2019 09/03/18   Carlus PavlovGherghe, Cristina, MD  Insulin Degludec (TRESIBA FLEXTOUCH) 200 UNIT/ML SOPN Inject 30 Units into the skin 2 (two) times daily. 06/21/19   Hughie ClossPahwani, Ravi, MD  Insulin Syringe-Needle U-100 (RELION INSULIN SYRINGE 1ML/31G) 31G X 5/16" 1 ML MISC Use with insulin twice daily as directed. 07/11/17   Doreene Nestlark, Katherine K, NP  lithium carbonate 300 MG capsule Take 3 capsules (900 mg total) by mouth daily. Patient not taking: Reported on 06/17/2019 10/23/18   Oletta DarterAgarwal, [place], MD  Multiple Vitamin (MULTIVITAMIN) tablet Take 1 tablet by mouth daily.    [provider]  Omega-3 Fatty Acids (EQL FISH OIL PO) Take 1,000 mg by mouth 3 (three) times daily.     [provider]  ondansetron (ZOFRAN ODT) 4 MG disintegrating tablet Take 1 tablet (4 mg total) by mouth every 8 (eight) hours as needed for nausea or vomiting. 06/17/19   Milagros Lollykstra, Richard S, MD  [facility] DEL[facility] LANCETS 33G MISC Use as instructed to test blood sugar 3 times daily 07/31/18   Carlus PavlovGherghe, Cristina, MD  polyethylene glycol (MIRALAX) 17 g packet Take 17 g by  mouth daily. 02/26/20   Ayriel Texidor, MD  sildenafil (VIAGRA) 100 MG tablet Take 1 tablet (100 mg total) by mouth daily as needed for erectile dysfunction. Patient not taking: Reported on 06/17/2019 03/08/18   Felix PaciniKuneff, Renee A, DO  simvastatin (ZOCOR) 40 MG tablet Take 1 tablet (40 mg total) by mouth at bedtime. Patient not taking: Reported on 06/17/2019 03/08/18   Felix PaciniKuneff, Renee A, DO  tretinoin (RETIN-A) 0.05 % cream APPLY  CREAM TOPICALLY TO AFFECTED AREA AS NEEDED Patient not taking: Reported on 06/17/2019 12/24/17   Felix PaciniKuneff, Renee A, DO  vitamin B-12 (CYANOCOBALAMIN) 500 MCG tablet Take 500  mcg by mouth daily.    [provider]  vitamin C (ASCORBIC ACID) 500 MG tablet Take 500 mg by mouth daily.    [provider]    Allergies    Kava kava, Aluminum, and Other  Review of Systems   Review of Systems  Constitutional: Negative for fatigue and fever.  HENT: Negative for congestion, ear pain, rhinorrhea and sore throat.   Respiratory: Negative for cough, shortness of breath and wheezing.   Cardiovascular: Negative for chest pain.  Gastrointestinal: Positive for constipation. Negative for abdominal pain, diarrhea, nausea and vomiting.  Genitourinary: Positive for difficulty urinating.  Musculoskeletal: Negative for myalgias.  Skin: Negative for rash.  Neurological: Negative for loss of consciousness and headaches.  Psychiatric/Behavioral: Negative for agitation.  All other systems reviewed and are negative.   Physical Exam Updated Vital Signs BP (!) 128/93 (BP Location: Right Arm)   Pulse 84   Temp 98.7 F (37.1 C) (Oral)   Resp 16   SpO2 98%   Physical Exam Vitals and nursing note reviewed.  Constitutional:      General: He is not in acute distress.    Appearance: Normal appearance.  HENT:     Head: Normocephalic and atraumatic.     Nose: Nose normal.  Eyes:     Conjunctiva/sclera: Conjunctivae normal.     Pupils: Pupils are equal, round, and reactive to  light.  Abdominal:     General: Bowel sounds are normal. There is distension.     Tenderness: There is no rebound.  Musculoskeletal:        General: Normal range of motion.  Skin:    General: Skin is warm and dry.     Capillary Refill: Capillary refill takes less than 2 seconds.  Neurological:     General: No focal deficit present.     Mental Status: He is alert and oriented to person, place, and time.     Deep Tendon Reflexes: Reflexes normal.  Psychiatric:        Mood and Affect: Mood normal.        Behavior: Behavior normal.     ED Results / Procedures / Treatments   Labs (all labs ordered are listed, but only abnormal results are displayed) Results for orders placed or performed during the hospital encounter of 02/26/20  Urinalysis, Routine w reflex microscopic Urine, Clean Catch  Result Value Ref Range   Color, Urine YELLOW YELLOW   APPearance CLEAR CLEAR   Specific Gravity, Urine 1.024 1.005 - 1.030   pH 5.0 5.0 - 8.0   Glucose, UA >=500 (A) NEGATIVE mg/dL   Hgb urine dipstick NEGATIVE NEGATIVE   Bilirubin Urine NEGATIVE NEGATIVE   Ketones, ur NEGATIVE NEGATIVE mg/dL   Protein, ur NEGATIVE NEGATIVE mg/dL   Nitrite NEGATIVE NEGATIVE   Leukocytes,Ua NEGATIVE NEGATIVE   RBC / HPF 0-5 0 - 5 RBC/hpf   WBC, UA 11-20 0 - 5 WBC/hpf   Bacteria, UA NONE SEEN NONE SEEN   Squamous Epithelial / LPF 0-5 0 - 5   Mucus PRESENT    Sperm, UA PRESENT   Basic metabolic panel  Result Value Ref Range   Sodium 139 135 - 145 mmol/L   Potassium 3.6 3.5 - 5.1 mmol/L   Chloride 103 98 - 111 mmol/L   CO2 25 22 - 32 mmol/L   Glucose, Bld 254 (H) 70 - 99 mg/dL   BUN 14 6 - 20 mg/dL   Creatinine, Ser 9.92 0.61 - 1.24 mg/dL  Calcium 9.7 8.9 - 10.3 mg/dL   GFR calc non Af Amer >60 >60 mL/min   GFR calc Af Amer >60 >60 mL/min   Anion gap 11 5 - 15  CBC  Result Value Ref Range   WBC 16.2 (H) 4.0 - 10.5 K/uL   RBC 4.63 4.22 - 5.81 MIL/uL   Hemoglobin 14.4 13.0 - 17.0 g/dL   HCT 52.7 39  - 52 %   MCV 92.7 80.0 - 100.0 fL   MCH 31.1 26.0 - 34.0 pg   MCHC 33.6 30.0 - 36.0 g/dL   RDW 78.2 42.3 - 53.6 %   Platelets 290 150 - 400 K/uL   nRBC 0.0 0.0 - 0.2 %   No results found.  Radiology No results found.  Procedures Procedures (including critical care time)  Medications Ordered in ED Medications  polyethylene glycol (MIRALAX / GLYCOLAX) packet 17 g (has no administration in time range)  lidocaine (XYLOCAINE) 2 % jelly 1 application (has no administration in time range)  cefTRIAXone (ROCEPHIN) 1 g in sodium chloride 0.9 % 100 mL IVPB (has no administration in time range)  tamsulosin (FLOMAX) capsule 0.4 mg (0.4 mg Oral Given 02/26/20 0419)    ED Course  I have reviewed the triage vital signs and the nursing notes.  Pertinent labs & imaging results that were available during my care of the patient were reviewed by me and considered in my medical decision making (see chart for details).   427 case d/w Dr. Laverle Patter who will seen the patient   Patient then voided for Dr. Laverle Patter when seen.  Dr. Laverle Patter recommends close follow up.  Will start miralax for constipation.    Willie Hughes was evaluated in Emergency Department on 02/26/2020 for the symptoms described in the history of present illness. He was evaluated in the context of the global COVID-19 pandemic, which necessitated consideration that the patient might be at risk for infection with the SARS-CoV-2 virus that causes COVID-19. Institutional protocols and algorithms that pertain to the evaluation of patients at risk for COVID-19 are in a state of rapid change based on information released by regulatory bodies including the CDC and federal and state organizations. These policies and algorithms were followed during the patient's care in the ED.  Final Clinical Impression(s) / ED Diagnoses Final diagnoses:  Acute urinary retention  Urinary retention   Return for intractable cough, coughing up blood,fevers >100.4  unrelieved by medication, shortness of breath, intractable vomiting, chest pain, shortness of breath, weakness,numbness, changes in speech, facial asymmetry,abdominal pain, passing out,Inability to tolerate liquids or food, cough, altered mental status or any concerns. No signs of systemic illness or infection. The patient is nontoxic-appearing on exam and vital signs are within normal limits.   I have reviewed the triage vital signs and the nursing notes. Pertinent labs &imaging results that were available during my care of the patient were reviewed by me and considered in my medical decision making (see chart for details).After history, exam, and medical workup I feel the patient has beenappropriately medically screened and is safe for discharge home. Pertinent diagnoses were discussed with the patient. Patient was given return precautions. Rx / DC Orders ED Discharge Orders         Ordered    polyethylene glycol (MIRALAX) 17 g packet  Daily     Discontinue  Reprint     02/26/20 0333           Admire Bunnell, MD 02/26/20 [phone]

## 2020-02-26 NOTE — ED Triage Notes (Signed)
Patient is complaining of not being able to poop or pee for 6 hours. Patient thinks that he has bugs.

## 2020-02-26 NOTE — Consult Note (Signed)
Urology Consult   Physician requesting consult: Dr. Nicanor Alcon  Reason for consult: Urinary retention with difficult urethral catheterization  History of Present Illness: Willie Hughes is a 59 y.o. who is a non-compliant, poorly controlled diabetic patient of Dr. Berneice Heinrich.  He underwent TURP last fall for unroofing of a prostatic abscess.  He was incidentally found to have low volume, low risk prostate cancer on the TURP specimen and this has been managed with surveillance.  He had been voiding well until the last 24 hrs when he developed an inability to void.  He presented to the ED and a bladder scan demonstrated about 650 cc.  Attempts to insert an 18 and 16 Fr catheter were unsuccessful and urologic consultation was obtained.  Upon my arrival, it was noted that he had voided 400 cc of clear urine.  He then voided an additional 350 cc while I was in the room.    Past Medical History:  Diagnosis Date  . Acne   . Allergic rhinitis   . Allergy   . Bipolar disorder (HCC)    managed by Psych- Dr. Michae Kava  . COPD (chronic obstructive pulmonary disease) (HCC)   . Erectile dysfunction   . Frequent headaches   . Heart disease   . Heart murmur   . Hyperlipidemia   . Hypothyroidism   . Pulmonary nodule 02/2018   Found on CT screen, Follow up due 02/2019  . Type 2 diabetes mellitus (HCC)    managed by Dr. Elvera Lennox.    Past Surgical History:  Procedure Laterality Date  . TRANSURETHRAL RESECTION OF PROSTATE N/A 06/17/2019   Procedure: TRANSURETHRAL RESECTION OF THE PROSTATE (TURP);  Surgeon: Sebastian Ache, MD;  Location: [facility];  Service: Urology;  Laterality: N/A;  . WISDOM TOOTH EXTRACTION      Medications:  Home meds:  No current facility-administered medications on file prior to encounter.   Current Outpatient Medications on File Prior to Encounter  Medication Sig Dispense Refill  . acetaminophen (TYLENOL) 325 MG tablet Take 650 mg by mouth every 6 (six) hours as needed.    Marland Kitchen albuterol  (PROVENTIL HFA;VENTOLIN HFA) 108 (90 Base) MCG/ACT inhaler Inhale 2 puffs into the lungs every 4 (four) hours as needed for wheezing or shortness of breath. (Patient not taking: Reported on 10/23/2018) 1 Inhaler 0  . aspirin 325 MG tablet Take 325 mg by mouth every 6 (six) hours as needed. Pt is taking 325mg  TID    . clindamycin (CLEOCIN T) 1 % external solution Apply topically 2 (two) times daily. 120 mL 11  . famotidine (PEPCID) 20 MG tablet Take 1 tablet (20 mg total) by mouth 2 (two) times daily. (Patient not taking: Reported on 06/17/2019) 60 tablet 11  . Fenofibrate 150 MG CAPS Take 1 capsule (150 mg total) by mouth daily. (Patient not taking: Reported on 06/17/2019) 90 each 3  . fexofenadine (ALLEGRA) 180 MG tablet Take 180 mg by mouth daily.     06/19/2019 glucose blood (ONE TOUCH TEST STRIPS) test strip Use as instructed to test blood sugar 3 times daily 300 each 2  . insulin aspart (NOVOLOG FLEXPEN) 100 UNIT/ML FlexPen Inject 15-18 Units into the skin 3 (three) times daily with meals. (Patient not taking: Reported on 06/17/2019) 15 pen 3  . Insulin Degludec (TRESIBA FLEXTOUCH) 200 UNIT/ML SOPN Inject 30 Units into the skin 2 (two) times daily. 1 pen 1  . Insulin Syringe-Needle U-100 (RELION INSULIN SYRINGE 1ML/31G) 31G X 5/16" 1 ML MISC Use with insulin  twice daily as directed. 100 each 1  . lithium carbonate 300 MG capsule Take 3 capsules (900 mg total) by mouth daily. (Patient not taking: Reported on 06/17/2019) 90 capsule 0  . Multiple Vitamin (MULTIVITAMIN) tablet Take 1 tablet by mouth daily.    . Omega-3 Fatty Acids (EQL FISH OIL PO) Take 1,000 mg by mouth 3 (three) times daily.     . ondansetron (ZOFRAN ODT) 4 MG disintegrating tablet Take 1 tablet (4 mg total) by mouth every 8 (eight) hours as needed for nausea or vomiting. 20 tablet 0  . ONETOUCH DELICA LANCETS 33G MISC Use as instructed to test blood sugar 3 times daily 300 each 1  . sildenafil (VIAGRA) 100 MG tablet Take 1 tablet (100 mg  total) by mouth daily as needed for erectile dysfunction. (Patient not taking: Reported on 06/17/2019) 16 tablet 5  . simvastatin (ZOCOR) 40 MG tablet Take 1 tablet (40 mg total) by mouth at bedtime. (Patient not taking: Reported on 06/17/2019) 90 tablet 3  . tretinoin (RETIN-A) 0.05 % cream APPLY  CREAM TOPICALLY TO AFFECTED AREA AS NEEDED (Patient not taking: Reported on 06/17/2019) 45 g 5  . vitamin B-12 (CYANOCOBALAMIN) 500 MCG tablet Take 500 mcg by mouth daily.    . vitamin C (ASCORBIC ACID) 500 MG tablet Take 500 mg by mouth daily.       Scheduled Meds: . lidocaine  1 application Urethral Once  . polyethylene glycol  17 g Oral Daily   Continuous Infusions: PRN Meds:.  Allergies:  Allergies  Allergen Reactions  . Kava Kava     Rash   . Aluminum Rash    Aluminum compounds in antiperspirant.   . Other Rash    Some antiperspirants cause a rash    Family History  Problem Relation Age of Onset  . Heart attack Father   . Stroke Father   . Mental illness Father   . Heart disease Father   . Drug abuse Father   . Depression Father   . Alcohol abuse Father   . Arthritis Father   . Stroke Mother   . Diabetes Mother   . Depression Mother   . Arthritis Mother   . Mental illness Mother   . Depression Sister   . Lupus Sister   . Bipolar disorder Paternal Aunt   . Lupus Sister   . Lupus Brother     Social History:  reports that he has quit smoking. His smoking use included cigarettes. He has a 70.00 pack-year smoking history. He has never used smokeless tobacco. He reports current alcohol use. He reports previous drug use. Drug: Marijuana.  ROS: A complete review of systems was performed.  All systems are negative except for pertinent findings as noted.  Physical Exam:  Vital signs in last 24 hours: Temp:  [98.7 F (37.1 C)] 98.7 F (37.1 C) (08/05 0231) Pulse Rate:  [65-84] 65 (08/05 0454) Resp:  [16-18] 18 (08/05 0454) BP: (116-128)/(68-93) 116/68 (08/05  0454) SpO2:  [97 %-98 %] 97 % (08/05 0454) Constitutional:  Alert and oriented, No acute distress Cardiovascular: No JVD Respiratory: Normal respiratory effort GI: Abdomen is soft, nontender, mild SP distention, no abdominal masses Genitourinary: No CVAT. Normal male phallus, testes are descended bilaterally and non-tender and without masses, scrotum is normal in appearance without lesions or masses, perineum is normal on inspection. Lymphatic: No lymphadenopathy Neurologic: Grossly intact, no focal deficits Psychiatric: Normal mood and affect although with some forced speech  Laboratory Data:  Recent Labs    02/26/20 0248  WBC 16.2*  HGB 14.4  HCT 42.9  PLT 290    Recent Labs    02/26/20 0248  NA 139  K 3.6  CL 103  GLUCOSE 254*  BUN 14  CALCIUM 9.7  CREATININE 0.62     Results for orders placed or performed during the hospital encounter of 02/26/20 (from the past 24 hour(s))  Urinalysis, Routine w reflex microscopic Urine, Clean Catch     Status: Abnormal   Collection Time: 02/26/20  2:48 AM  Result Value Ref Range   Color, Urine YELLOW YELLOW   APPearance CLEAR CLEAR   Specific Gravity, Urine 1.024 1.005 - 1.030   pH 5.0 5.0 - 8.0   Glucose, UA >=500 (A) NEGATIVE mg/dL   Hgb urine dipstick NEGATIVE NEGATIVE   Bilirubin Urine NEGATIVE NEGATIVE   Ketones, ur NEGATIVE NEGATIVE mg/dL   Protein, ur NEGATIVE NEGATIVE mg/dL   Nitrite NEGATIVE NEGATIVE   Leukocytes,Ua NEGATIVE NEGATIVE   RBC / HPF 0-5 0 - 5 RBC/hpf   WBC, UA 11-20 0 - 5 WBC/hpf   Bacteria, UA NONE SEEN NONE SEEN   Squamous Epithelial / LPF 0-5 0 - 5   Mucus PRESENT    Sperm, UA PRESENT   Basic metabolic panel     Status: Abnormal   Collection Time: 02/26/20  2:48 AM  Result Value Ref Range   Sodium 139 135 - 145 mmol/L   Potassium 3.6 3.5 - 5.1 mmol/L   Chloride 103 98 - 111 mmol/L   CO2 25 22 - 32 mmol/L   Glucose, Bld 254 (H) 70 - 99 mg/dL   BUN 14 6 - 20 mg/dL   Creatinine, Ser 8.52 0.61  - 1.24 mg/dL   Calcium 9.7 8.9 - 77.8 mg/dL   GFR calc non Af Amer >60 >60 mL/min   GFR calc Af Amer >60 >60 mL/min   Anion gap 11 5 - 15  CBC     Status: Abnormal   Collection Time: 02/26/20  2:48 AM  Result Value Ref Range   WBC 16.2 (H) 4.0 - 10.5 K/uL   RBC 4.63 4.22 - 5.81 MIL/uL   Hemoglobin 14.4 13.0 - 17.0 g/dL   HCT 24.2 39 - 52 %   MCV 92.7 80.0 - 100.0 fL   MCH 31.1 26.0 - 34.0 pg   MCHC 33.6 30.0 - 36.0 g/dL   RDW 35.3 61.4 - 43.1 %   Platelets 290 150 - 400 K/uL   nRBC 0.0 0.0 - 0.2 %   No results found for this or any previous visit (from the past 240 hour(s)).  Renal Function: Recent Labs    02/26/20 0248  CREATININE 0.62   CrCl cannot be calculated (Unknown ideal weight.).  Radiologic Imaging: No results found.  I independently reviewed the above imaging studies.  Impression/Recommendation Urinary retention: Pt now voided adequate amounts of clear urine.  Will avoid catheterization but have recommended close follow up next week to recheck PVR.  Ok to start alpha blocker therapy although patient states that he may have difficulty affording more medication and I expressed the importance of his diabetic control over taking tamsulosin. UA is not highly suspicious for infection.  He did get ceftriaxone in the ED and this should be sufficient without a need for further antibiotic therapy.  Culture is pending.  Crecencio Mc 02/26/2020, 5:51 AM    Moody Bruins MD  CC: Dr. Nicanor Alcon

## 2020-02-26 NOTE — ED Notes (Signed)
Bladder scan showed >697ml in bladder

## 2020-03-11 IMAGING — CT CT CHEST LUNG CANCER SCREENING LOW DOSE W/O CM
3 of 4 series · 14 of 36 positions shown, 15 images · non-contrast
Comparison: None.

CLINICAL DATA: 56-year-old male former smoker, quit 1 year ago,
with 80 pack-year history of smoking, for initial lung cancer
screening

EXAM:
CT CHEST WITHOUT CONTRAST LOW-DOSE FOR LUNG CANCER SCREENING
TECHNIQUE: Multidetector CT imaging of the chest was performed following the
standard protocol without IV contrast.

[Series 2: axial st · axial · 0.88mm/px · z∈[-484,-309]mm · 3 of 71 slices shown, 4 images]
[im 18/71  mediastinal]
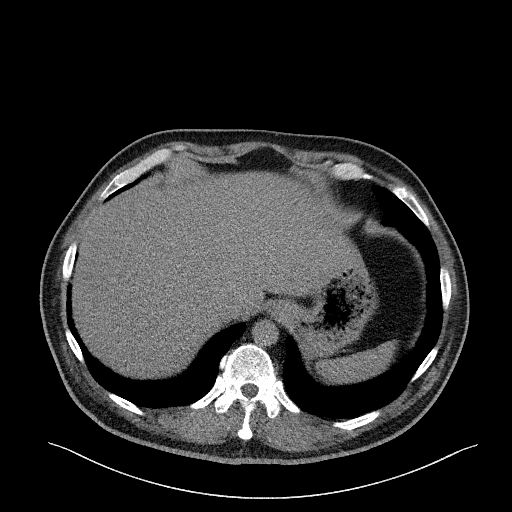
[im 18/71  lung]
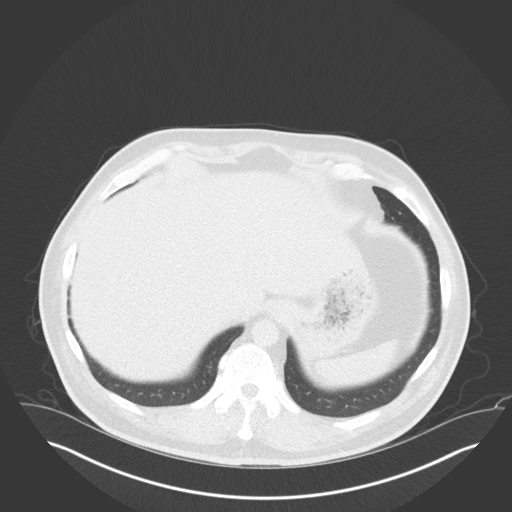
[im 36/71  lung]
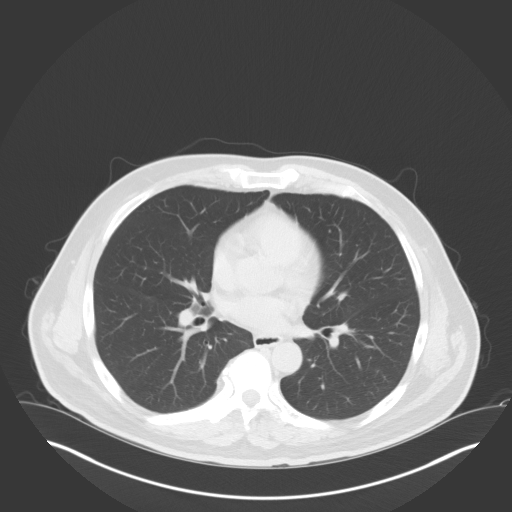
[im 53/71  lung]
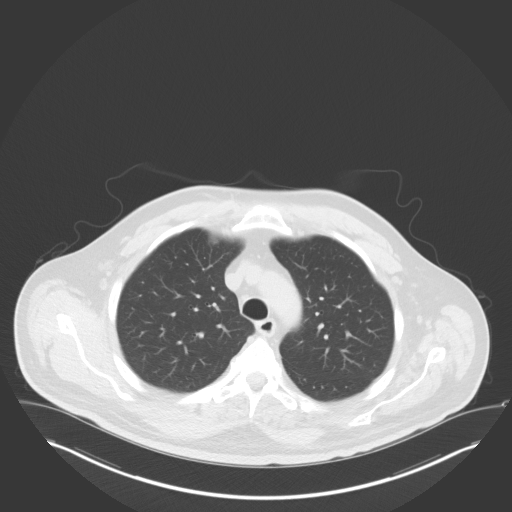

[Series 3: coronal · coronal · 0.79mm/px · 3 of 266 slices shown]
[im 54/266  lung]
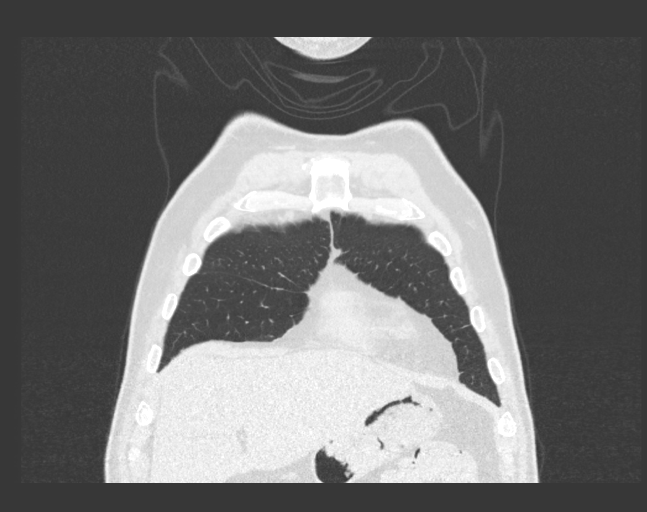
[im 107/266  lung]
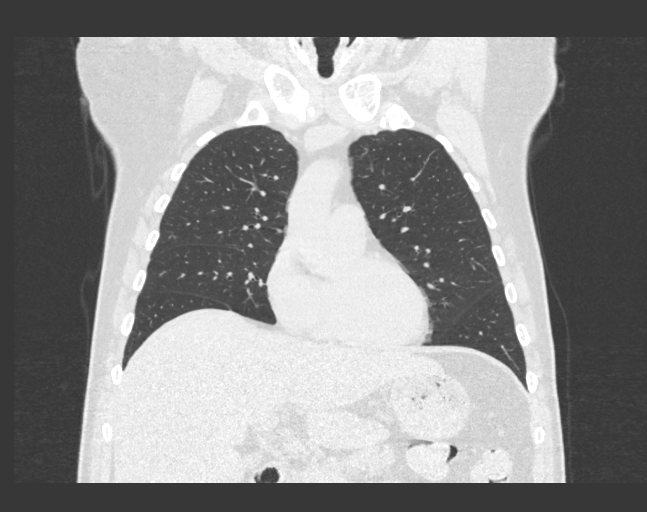
[im 160/266  lung]
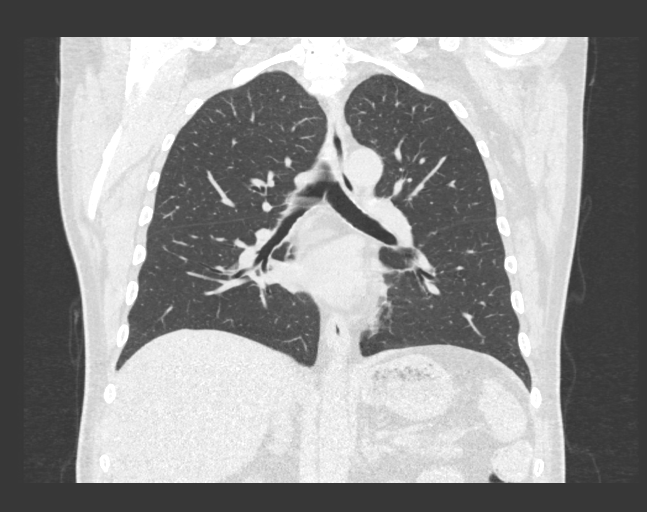

[Series 6: lungs · axial · 0.88mm/px · z∈[-518,-266]mm · 8 of 310 slices shown]
[im 29/310  lung]
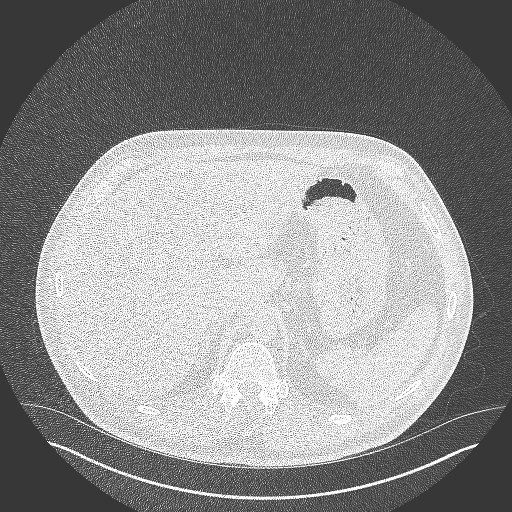
[im 57/310  lung]
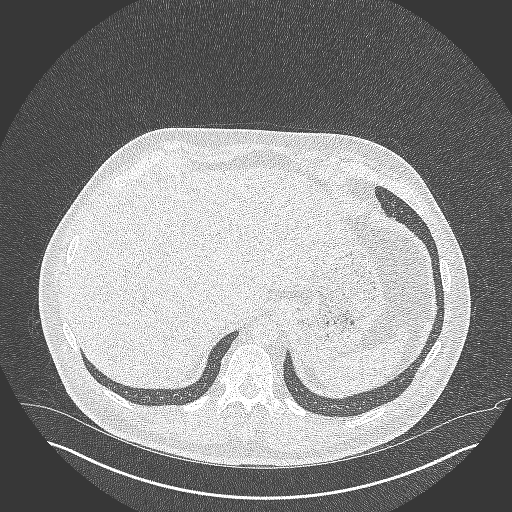
[im 99/310  lung]
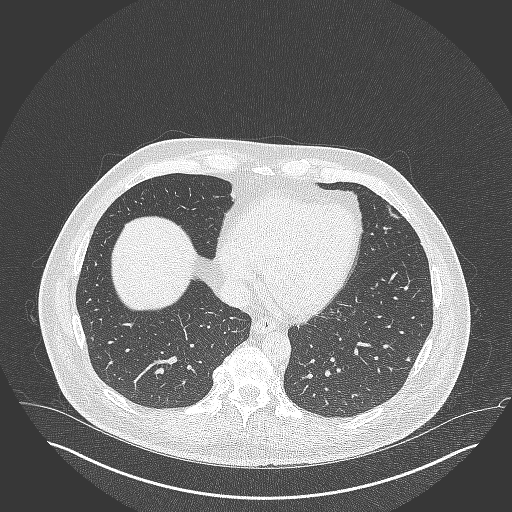
[im 141/310  lung]
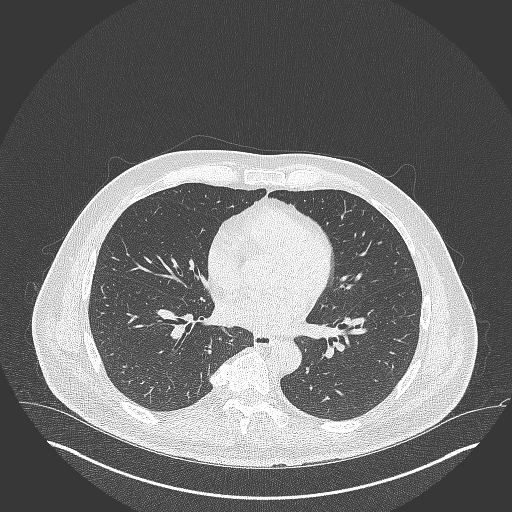
[im 169/310  lung]
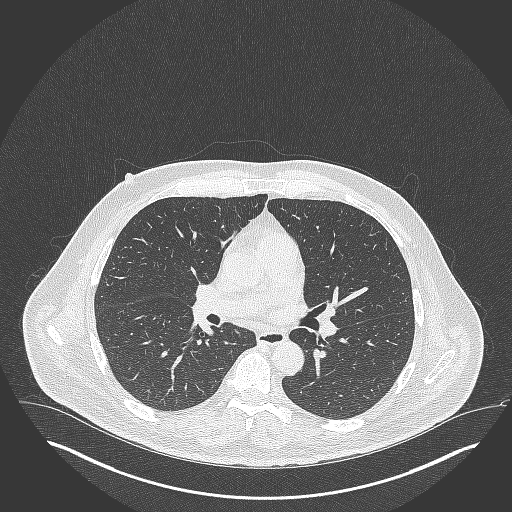
[im 211/310  lung]
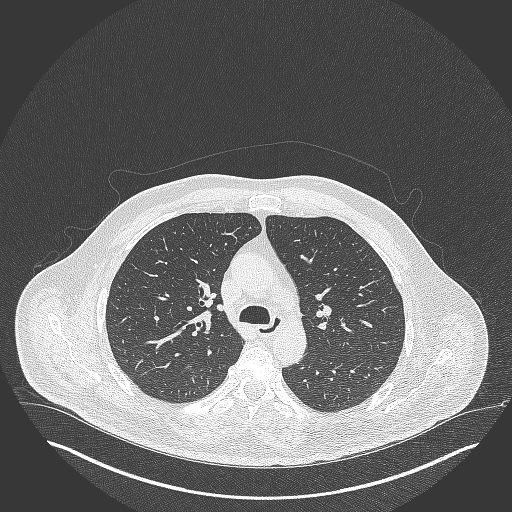
[im 253/310  lung]
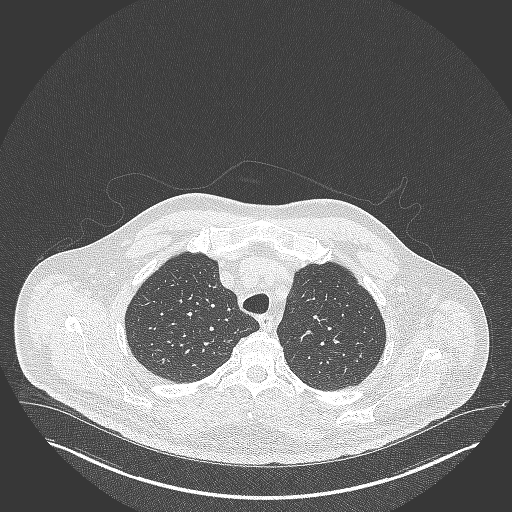
[im 281/310  lung]
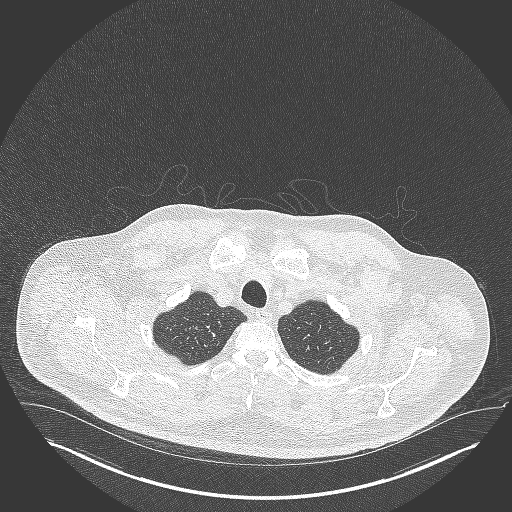

[14 of 36 positions shown; findings below may reference images not displayed]

FINDINGS: Cardiovascular: Heart is normal in size.  No pericardial effusion.

No evidence of thoracic aortic aneurysm.

Mild coronary atherosclerosis the LAD.

Mediastinum/Nodes: No suspicious mediastinal lymphadenopathy.

Visualized thyroid is unremarkable.

Lungs/Pleura: Mild biapical pleural-parenchymal scarring.

No focal consolidation.  Mild lingular scarring/atelectasis.

Small bilateral pulmonary nodules, measuring up to 4.5 mm.

No pleural effusion or pneumothorax.

Upper Abdomen: Visualized upper abdomen is notable for mild hepatic
steatosis.

Musculoskeletal: Mild degenerative changes of the visualized
thoracolumbar spine.
IMPRESSION: Lung-RADS 2, benign appearance or behavior. Continue annual
screening with low-dose chest CT without contrast in 12 months.

## 2020-08-11 DIAGNOSIS — E1165 Type 2 diabetes mellitus with hyperglycemia: Secondary | ICD-10-CM | POA: Diagnosis not present

## 2020-08-11 DIAGNOSIS — Z79899 Other long term (current) drug therapy: Secondary | ICD-10-CM | POA: Diagnosis not present

## 2020-08-11 DIAGNOSIS — R7989 Other specified abnormal findings of blood chemistry: Secondary | ICD-10-CM | POA: Diagnosis not present

## 2020-08-11 DIAGNOSIS — Z87891 Personal history of nicotine dependence: Secondary | ICD-10-CM | POA: Diagnosis not present

## 2020-09-04 DIAGNOSIS — Z23 Encounter for immunization: Secondary | ICD-10-CM | POA: Diagnosis not present

## 2020-12-08 DIAGNOSIS — Z794 Long term (current) use of insulin: Secondary | ICD-10-CM | POA: Diagnosis not present

## 2020-12-08 DIAGNOSIS — E785 Hyperlipidemia, unspecified: Secondary | ICD-10-CM | POA: Diagnosis not present

## 2020-12-08 DIAGNOSIS — F919 Conduct disorder, unspecified: Secondary | ICD-10-CM | POA: Diagnosis not present

## 2020-12-08 DIAGNOSIS — Z87891 Personal history of nicotine dependence: Secondary | ICD-10-CM | POA: Diagnosis not present

## 2020-12-08 DIAGNOSIS — Z20822 Contact with and (suspected) exposure to covid-19: Secondary | ICD-10-CM | POA: Diagnosis not present

## 2020-12-08 DIAGNOSIS — F209 Schizophrenia, unspecified: Secondary | ICD-10-CM | POA: Diagnosis not present

## 2020-12-08 DIAGNOSIS — R456 Violent behavior: Secondary | ICD-10-CM | POA: Diagnosis not present

## 2020-12-08 DIAGNOSIS — E1165 Type 2 diabetes mellitus with hyperglycemia: Secondary | ICD-10-CM | POA: Diagnosis not present

## 2020-12-08 DIAGNOSIS — Z886 Allergy status to analgesic agent status: Secondary | ICD-10-CM | POA: Diagnosis not present

## 2020-12-08 DIAGNOSIS — R45851 Suicidal ideations: Secondary | ICD-10-CM | POA: Diagnosis not present

## 2020-12-08 DIAGNOSIS — Z792 Long term (current) use of antibiotics: Secondary | ICD-10-CM | POA: Diagnosis not present

## 2020-12-08 DIAGNOSIS — Z79899 Other long term (current) drug therapy: Secondary | ICD-10-CM | POA: Diagnosis not present

## 2020-12-08 DIAGNOSIS — E876 Hypokalemia: Secondary | ICD-10-CM | POA: Diagnosis not present

## 2020-12-09 DIAGNOSIS — Z62810 Personal history of physical and sexual abuse in childhood: Secondary | ICD-10-CM | POA: Diagnosis present

## 2020-12-09 DIAGNOSIS — Z20822 Contact with and (suspected) exposure to covid-19: Secondary | ICD-10-CM | POA: Diagnosis not present

## 2020-12-09 DIAGNOSIS — Z5901 Sheltered homelessness: Secondary | ICD-10-CM | POA: Diagnosis not present

## 2020-12-09 DIAGNOSIS — Z8616 Personal history of COVID-19: Secondary | ICD-10-CM | POA: Diagnosis not present

## 2020-12-09 DIAGNOSIS — Z792 Long term (current) use of antibiotics: Secondary | ICD-10-CM | POA: Diagnosis not present

## 2020-12-09 DIAGNOSIS — F919 Conduct disorder, unspecified: Secondary | ICD-10-CM | POA: Diagnosis not present

## 2020-12-09 DIAGNOSIS — F250 Schizoaffective disorder, bipolar type: Secondary | ICD-10-CM | POA: Diagnosis present

## 2020-12-09 DIAGNOSIS — R45850 Homicidal ideations: Secondary | ICD-10-CM | POA: Diagnosis present

## 2020-12-09 DIAGNOSIS — E1165 Type 2 diabetes mellitus with hyperglycemia: Secondary | ICD-10-CM | POA: Diagnosis not present

## 2020-12-09 DIAGNOSIS — E119 Type 2 diabetes mellitus without complications: Secondary | ICD-10-CM | POA: Diagnosis present

## 2020-12-09 DIAGNOSIS — R45851 Suicidal ideations: Secondary | ICD-10-CM | POA: Diagnosis not present

## 2020-12-09 DIAGNOSIS — K219 Gastro-esophageal reflux disease without esophagitis: Secondary | ICD-10-CM | POA: Diagnosis present

## 2020-12-09 DIAGNOSIS — J449 Chronic obstructive pulmonary disease, unspecified: Secondary | ICD-10-CM | POA: Diagnosis present

## 2020-12-09 DIAGNOSIS — Z9114 Patient's other noncompliance with medication regimen: Secondary | ICD-10-CM | POA: Diagnosis not present

## 2020-12-09 DIAGNOSIS — E785 Hyperlipidemia, unspecified: Secondary | ICD-10-CM | POA: Diagnosis not present

## 2020-12-09 DIAGNOSIS — F209 Schizophrenia, unspecified: Secondary | ICD-10-CM | POA: Diagnosis not present

## 2020-12-09 DIAGNOSIS — Z794 Long term (current) use of insulin: Secondary | ICD-10-CM | POA: Diagnosis not present

## 2021-02-04 DIAGNOSIS — Z23 Encounter for immunization: Secondary | ICD-10-CM | POA: Diagnosis not present

## 2021-02-10 DIAGNOSIS — H25013 Cortical age-related cataract, bilateral: Secondary | ICD-10-CM | POA: Diagnosis not present

## 2021-02-10 DIAGNOSIS — H2513 Age-related nuclear cataract, bilateral: Secondary | ICD-10-CM | POA: Diagnosis not present

## 2021-02-10 DIAGNOSIS — H04123 Dry eye syndrome of bilateral lacrimal glands: Secondary | ICD-10-CM | POA: Diagnosis not present

## 2021-02-10 DIAGNOSIS — H524 Presbyopia: Secondary | ICD-10-CM | POA: Diagnosis not present

## 2021-05-13 ENCOUNTER — Emergency Department (HOSPITAL_COMMUNITY)
Admission: EM | Admit: 2021-05-13 | Discharge: 2021-05-14 | Disposition: A | Discharge status: Home / Self Care | Payer: Medicare Other | Attending: Emergency Medicine | Admitting: Emergency Medicine

## 2021-05-13 ENCOUNTER — Encounter (HOSPITAL_COMMUNITY): Payer: Self-pay | Admitting: Emergency Medicine

## 2021-05-13 DIAGNOSIS — R197 Diarrhea, unspecified: Secondary | ICD-10-CM | POA: Diagnosis present

## 2021-05-13 DIAGNOSIS — E119 Type 2 diabetes mellitus without complications: Secondary | ICD-10-CM | POA: Diagnosis not present

## 2021-05-13 DIAGNOSIS — Z794 Long term (current) use of insulin: Secondary | ICD-10-CM | POA: Diagnosis not present

## 2021-05-13 DIAGNOSIS — Z87891 Personal history of nicotine dependence: Secondary | ICD-10-CM | POA: Diagnosis not present

## 2021-05-13 DIAGNOSIS — E039 Hypothyroidism, unspecified: Secondary | ICD-10-CM | POA: Diagnosis not present

## 2021-05-13 DIAGNOSIS — Z20822 Contact with and (suspected) exposure to covid-19: Secondary | ICD-10-CM | POA: Diagnosis not present

## 2021-05-13 DIAGNOSIS — E876 Hypokalemia: Secondary | ICD-10-CM | POA: Insufficient documentation

## 2021-05-13 DIAGNOSIS — K029 Dental caries, unspecified: Secondary | ICD-10-CM | POA: Insufficient documentation

## 2021-05-13 LAB — CBC WITH DIFFERENTIAL/PLATELET
Band Neutrophils: 0 %
Basophils Relative: 1 %
Blasts: NONE SEEN %
Eosinophils Relative: 2 %
HCT: 46.1 % (ref 39.0–52.0)
Hemoglobin: 15.7 g/dL (ref 13.0–17.0)
Lymphocytes Relative: 5 %
MCH: 30.2 pg (ref 26.0–34.0)
MCHC: 34.1 g/dL (ref 30.0–36.0)
MCV: 88.7 fL (ref 80.0–100.0)
Metamyelocytes Relative: NONE SEEN %
Monocytes Relative: 5 %
Myelocytes: NONE SEEN %
Neutrophils Relative %: 87 %
Platelets: 227 10*3/uL (ref 150–400)
Promyelocytes Relative: NONE SEEN %
RBC Morphology: NORMAL
RBC: 5.2 MIL/uL (ref 4.22–5.81)
RDW: 12.2 % (ref 11.5–15.5)
WBC Morphology: NORMAL
WBC: 14.5 10*3/uL — ABNORMAL HIGH (ref 4.0–10.5)
nRBC: 0 % (ref 0.0–0.2)
nRBC: NONE SEEN /100 WBC

## 2021-05-13 LAB — COMPREHENSIVE METABOLIC PANEL
ALT: 25 U/L (ref 0–44)
AST: 24 U/L (ref 15–41)
Albumin: 4.4 g/dL (ref 3.5–5.0)
Alkaline Phosphatase: 130 U/L — ABNORMAL HIGH (ref 38–126)
Anion gap: 10 (ref 5–15)
BUN: 6 mg/dL (ref 6–20)
CO2: 28 mmol/L (ref 22–32)
Calcium: 9.4 mg/dL (ref 8.9–10.3)
Chloride: 101 mmol/L (ref 98–111)
Creatinine, Ser: 0.55 mg/dL — ABNORMAL LOW (ref 0.61–1.24)
GFR, Estimated: >60 mL/min (ref >60)
Glucose, Bld: 242 mg/dL — ABNORMAL HIGH (ref 70–99)
Potassium: 2.9 mmol/L — ABNORMAL LOW (ref 3.5–5.1)
Sodium: 139 mmol/L (ref 135–145)
Total Bilirubin: 0.5 mg/dL (ref 0.3–1.2)
Total Protein: 8.2 g/dL — ABNORMAL HIGH (ref 6.5–8.1)

## 2021-05-13 LAB — LIPASE, BLOOD: Lipase: 31 U/L (ref 11–51)

## 2021-05-13 MED ORDER — POTASSIUM CHLORIDE 10 MEQ/100ML IV SOLN
10.0000 meq | Freq: Once | INTRAVENOUS | Status: AC
  Administered 2021-05-14: 10 meq via INTRAVENOUS
  Filled 2021-05-13: qty 100

## 2021-05-13 MED ORDER — SODIUM CHLORIDE 0.9 % IV BOLUS
1000.0000 mL | Freq: Once | INTRAVENOUS | Status: AC
  Administered 2021-05-14: 1000 mL via INTRAVENOUS

## 2021-05-13 NOTE — ED Provider Notes (Signed)
[facility]-EMERGENCY DEPT Provider Note   CSN: [id number] Arrival date & time: 05/13/21  2101     History Chief Complaint  Patient presents with   Emesis   Diarrhea    Willie Hughes is a 60 y.o. male.  Patient with history of T2DM, HLD, COPD, prostate CA to ED with symptoms that started today of diarrhea (2-4), vomiting (2-3). No bloody stools or hematemesis. He feels generally weak. He has a tooth with severe decay for several months. He also reports he was using an alcohol burner for cooking and for heat with isopropyl alcohol without being aware this was not the right alcohol to use. He is concerned he has an infection - either COVID or a bacterial infection secondary to the decaying tooth - or has been affected by burning the wrong type alcohol. No fever. No cough. He has nasal congestion but reports it is c/w ongoing congestion/allergy. He has had COVID numerous times and feels this is a possibility.   The history is provided by the patient. No language interpreter was used.  Emesis Associated symptoms: diarrhea   Associated symptoms: no abdominal pain, no chills, no cough, no fever and no sore throat   Diarrhea Associated symptoms: vomiting   Associated symptoms: no abdominal pain, no chills and no fever       Past Medical History:  Diagnosis Date   Acne    Allergic rhinitis    Allergy    Bipolar disorder (HCC)    managed by Psych- Dr. Michae Kava   COPD (chronic obstructive pulmonary disease) (HCC)    Erectile dysfunction    Frequent headaches    Heart disease    Heart murmur    Hyperlipidemia    Hypothyroidism    Pulmonary nodule 02/2018   Found on CT screen, Follow up due 02/2019   Type 2 diabetes mellitus (HCC)    managed by Dr. Elvera Lennox.    Patient Active Problem List   Diagnosis Date Noted   Pressure injury of skin 06/18/2019   Prostate abscess 06/17/2019   Hyponatremia 06/17/2019   Decreased hearing, right 04/03/2018   Otitis externa  due to Pseudomonas aeruginosa 04/03/2018   Pulmonary nodule 03/13/2018   Abnormal SPEP 02/26/2018   Uncontrolled type 2 diabetes mellitus with peripheral neuropathy 11/20/2017   History of tobacco abuse 02/01/2017   Arthritis 12/20/2016   Chronic allergic rhinitis 09/20/2016   Chronic headaches 09/20/2016   Hypothyroidism 01/28/2016   Hyperlipidemia 12/15/2015   Erectile dysfunction 12/15/2015   Acne vulgaris 12/15/2015   Bipolar disorder (HCC) 12/15/2015    Past Surgical History:  Procedure Laterality Date   TRANSURETHRAL RESECTION OF PROSTATE N/A 06/17/2019   Procedure: TRANSURETHRAL RESECTION OF THE PROSTATE (TURP);  Surgeon: Sebastian Ache, MD;  Location: [facility];  Service: Urology;  Laterality: N/A;   WISDOM TOOTH EXTRACTION         Family History  Problem Relation Age of Onset   Heart attack Father    Stroke Father    Mental illness Father    Heart disease Father    Drug abuse Father    Depression Father    Alcohol abuse Father    Arthritis Father    Stroke Mother    Diabetes Mother    Depression Mother    Arthritis Mother    Mental illness Mother    Depression Sister    Lupus Sister    Bipolar disorder Paternal Aunt    Lupus Sister    Lupus Brother  Social History   Tobacco Use   Smoking status: Former    Packs/day: 2.00    Years: 35.00    Pack years: 70.00    Types: Cigarettes   Smokeless tobacco: Never   Tobacco comments:    Vapor  Vaping Use   Vaping Use: Former   Start date: 03/07/2013   Quit date: 08/24/2017  Substance Use Topics   Alcohol use: Yes    Alcohol/week: 0.0 standard drinks    Comment: a few beers a month   Drug use: Not Currently    Types: Marijuana    Home Medications Prior to Admission medications   Medication Sig Start Date End Date Taking? Authorizing Provider  acetaminophen (TYLENOL) 325 MG tablet Take 650 mg by mouth every 6 (six) hours as needed.    [provider]  albuterol (PROVENTIL HFA;VENTOLIN HFA)  108 (90 Base) MCG/ACT inhaler Inhale 2 puffs into the lungs every 4 (four) hours as needed for wheezing or shortness of breath. Patient not taking: Reported on 10/23/2018 02/02/17   Doreene Nest, NP  aspirin 325 MG tablet Take 325 mg by mouth every 6 (six) hours as needed. Pt is taking 325mg  TID    [provider]  cephALEXin (KEFLEX) 500 MG capsule Take 1 capsule (500 mg total) by mouth 4 (four) times daily. 02/26/20   Palumbo, April, MD  clindamycin (CLEOCIN T) 1 % external solution Apply topically 2 (two) times daily. 10/24/18   Kuneff, Renee A, DO  famotidine (PEPCID) 20 MG tablet Take 1 tablet (20 mg total) by mouth 2 (two) times daily. Patient not taking: Reported on 06/17/2019 07/10/18   07/12/18 A, DO  Fenofibrate 150 MG CAPS Take 1 capsule (150 mg total) by mouth daily. Patient not taking: Reported on 06/17/2019 03/08/18   03/10/18 A, DO  fexofenadine (ALLEGRA) 180 MG tablet Take 180 mg by mouth daily.     [provider]  glucose blood (ONE TOUCH TEST STRIPS) test strip Use as instructed to test blood sugar 3 times daily 07/31/18   09/29/18, MD  insulin aspart (NOVOLOG FLEXPEN) 100 UNIT/ML FlexPen Inject 15-18 Units into the skin 3 (three) times daily with meals. Patient not taking: Reported on 06/17/2019 09/03/18   11/02/18, MD  Insulin Degludec (TRESIBA FLEXTOUCH) 200 UNIT/ML SOPN Inject 30 Units into the skin 2 (two) times daily. 06/21/19   06/23/19, MD  Insulin Syringe-Needle U-100 (RELION INSULIN SYRINGE 1ML/31G) 31G X 5/16" 1 ML MISC Use with insulin twice daily as directed. 07/11/17   07/13/17, NP  lithium carbonate 300 MG capsule Take 3 capsules (900 mg total) by mouth daily. Patient not taking: Reported on 06/17/2019 10/23/18   12/23/18, MD  Multiple Vitamin (MULTIVITAMIN) tablet Take 1 tablet by mouth daily.    [provider]  Omega-3 Fatty Acids (EQL FISH OIL PO) Take 1,000 mg by mouth 3 (three) times daily.      [provider]  ondansetron (ZOFRAN ODT) 4 MG disintegrating tablet Take 1 tablet (4 mg total) by mouth every 8 (eight) hours as needed for nausea or vomiting. 06/17/19   06/19/19, MD  [facility] DELICA LANCETS 33G MISC Use as instructed to test blood sugar 3 times daily 07/31/18   09/29/18, MD  polyethylene glycol (MIRALAX) 17 g packet Take 17 g by mouth daily. 02/26/20   Palumbo, April, MD  sildenafil (VIAGRA) 100 MG tablet Take 1 tablet (100 mg total) by mouth daily  as needed for erectile dysfunction. Patient not taking: Reported on 06/17/2019 03/08/18   Felix Pacini A, DO  simvastatin (ZOCOR) 40 MG tablet Take 1 tablet (40 mg total) by mouth at bedtime. Patient not taking: Reported on 06/17/2019 03/08/18   Felix Pacini A, DO  tamsulosin (FLOMAX) 0.4 MG CAPS capsule Take 1 capsule (0.4 mg total) by mouth daily. 02/26/20   Palumbo, April, MD  tretinoin (RETIN-A) 0.05 % cream APPLY  CREAM TOPICALLY TO AFFECTED AREA AS NEEDED Patient not taking: Reported on 06/17/2019 12/24/17   Kuneff, Renee A, DO  vitamin B-12 (CYANOCOBALAMIN) 500 MCG tablet Take 500 mcg by mouth daily.    [provider]  vitamin C (ASCORBIC ACID) 500 MG tablet Take 500 mg by mouth daily.    [provider]    Allergies    Kava kava, Aluminum, and Other  Review of Systems   Review of Systems  Constitutional:  Negative for chills and fever.  HENT:  Positive for dental problem. Negative for facial swelling and sore throat.   Respiratory: Negative.  Negative for cough and shortness of breath.   Cardiovascular: Negative.   Gastrointestinal:  Positive for diarrhea and vomiting. Negative for abdominal pain.  Genitourinary: Negative.   Musculoskeletal: Negative.   Skin: Negative.  Negative for pallor and rash.  Neurological:  Positive for weakness.   Physical Exam Updated Vital Signs BP (!) 159/91 (BP Location: Left Arm)   Pulse 83   Temp 98.9 F (37.2 C) (Oral)   Resp 15   Ht  6\' 4"  (1.93 m)   Wt 86.2 kg   SpO2 100%   BMI 23.13 kg/m   Physical Exam Vitals and nursing note reviewed.  Constitutional:      Appearance: He is well-developed.     Comments: Disheveled  HENT:     Head: Normocephalic.     Nose: Nose normal.     Mouth/Throat:     Mouth: Mucous membranes are moist.     Comments: Generally poor dentition with deep cavity of upper left 1st/2nd molar teeth. No visualized abscess.  Neck:     Vascular: No carotid bruit.  Cardiovascular:     Rate and Rhythm: Normal rate and regular rhythm.     Heart sounds: No murmur heard. Pulmonary:     Effort: Pulmonary effort is normal.     Breath sounds: Normal breath sounds. No wheezing, rhonchi or rales.  Abdominal:     General: Bowel sounds are normal.     Palpations: Abdomen is soft.     Tenderness: There is no abdominal tenderness. There is no guarding or rebound.  Musculoskeletal:        General: Normal range of motion.     Cervical back: Normal range of motion and neck supple.     Right lower leg: No edema.     Left lower leg: No edema.  Skin:    General: Skin is warm and dry.     Coloration: Skin is not pale.  Neurological:     General: No focal deficit present.     Mental Status: He is alert and oriented to person, place, and time.    ED Results / Procedures / Treatments   Labs (all labs ordered are listed, but only abnormal results are displayed) Labs Reviewed  COMPREHENSIVE METABOLIC PANEL - Abnormal; Notable for the following components:      Result Value   Potassium 2.9 (*)    Glucose, Bld 242 (*)  Creatinine, Ser 0.55 (*)    Total Protein 8.2 (*)    Alkaline Phosphatase 130 (*)    All other components within normal limits  CBC WITH DIFFERENTIAL/PLATELET - Abnormal; Notable for the following components:   WBC 14.5 (*)    All other components within normal limits  LIPASE, BLOOD  URINALYSIS, ROUTINE W REFLEX MICROSCOPIC    EKG None  Radiology No results  found.  Procedures Procedures   Medications Ordered in ED Medications - No data to display  ED Course  I have reviewed the triage vital signs and the nursing notes.  Pertinent labs & imaging results that were available during my care of the patient were reviewed by me and considered in my medical decision making (see chart for details).    MDM Rules/Calculators/A&P                           Patient to ED with symptoms as per HPI.  Vomiting and diarrhea stopped prior to arrival. VSS.   Potassium low at 2.9. This was supplemented while receiving IVF's.   The patient reports feeling much better after treatment. Will Rx Penicillin to cover for significant dental decay. Recommend dental follow up .  Final Clinical Impression(s) / ED Diagnoses Final diagnoses:  None   Hypokalemia Dental caries  Rx / DC Orders ED Discharge Orders     None        Danne Harbor 05/14/21 0327    Lorre Nick, MD 05/16/21 1015

## 2021-05-13 NOTE — ED Triage Notes (Signed)
N/v/d x 2 hours. Pt thinks he has covid. VSS.

## 2021-05-14 ENCOUNTER — Other Ambulatory Visit: Payer: Self-pay

## 2021-05-14 LAB — CBG MONITORING, ED: Glucose-Capillary: 175 mg/dL — ABNORMAL HIGH (ref 70–99)

## 2021-05-14 LAB — RESP PANEL BY RT-PCR (FLU A&B, COVID) ARPGX2
Influenza A by PCR: NEGATIVE
Influenza B by PCR: NEGATIVE
SARS Coronavirus 2 by RT PCR: NEGATIVE

## 2021-05-14 MED ORDER — PENICILLIN V POTASSIUM 500 MG PO TABS: 500.0000 mg | ORAL_TABLET | Freq: Three times a day (TID) | ORAL | 0 refills | Status: DC

## 2021-05-14 NOTE — ED Notes (Signed)
Pt requested glucose check prior to discharge. CBG 175

## 2021-05-14 NOTE — Discharge Instructions (Signed)
Take your potassium supplement twice a day for the next 3 days.   You have been given a prescription for penicillin to cover for dental infection. Fill the prescription at a pharmacy of your choice.

## 2021-09-28 ENCOUNTER — Emergency Department (HOSPITAL_COMMUNITY)
Admission: EM | Admit: 2021-09-28 | Discharge: 2021-09-30 | Disposition: A | Discharge status: Other Acute Inpatient Hospital | Payer: Medicare Other | Attending: Emergency Medicine | Admitting: Emergency Medicine

## 2021-09-28 DIAGNOSIS — Z7982 Long term (current) use of aspirin: Secondary | ICD-10-CM | POA: Insufficient documentation

## 2021-09-28 DIAGNOSIS — Z20822 Contact with and (suspected) exposure to covid-19: Secondary | ICD-10-CM | POA: Insufficient documentation

## 2021-09-28 DIAGNOSIS — F22 Delusional disorders: Secondary | ICD-10-CM | POA: Diagnosis not present

## 2021-09-28 DIAGNOSIS — E119 Type 2 diabetes mellitus without complications: Secondary | ICD-10-CM | POA: Insufficient documentation

## 2021-09-28 DIAGNOSIS — F251 Schizoaffective disorder, depressive type: Secondary | ICD-10-CM | POA: Diagnosis not present

## 2021-09-28 DIAGNOSIS — R4789 Other speech disturbances: Secondary | ICD-10-CM | POA: Diagnosis not present

## 2021-09-28 DIAGNOSIS — Z046 Encounter for general psychiatric examination, requested by authority: Secondary | ICD-10-CM | POA: Diagnosis present

## 2021-09-28 DIAGNOSIS — E039 Hypothyroidism, unspecified: Secondary | ICD-10-CM | POA: Diagnosis not present

## 2021-09-28 DIAGNOSIS — Z79899 Other long term (current) drug therapy: Secondary | ICD-10-CM | POA: Diagnosis not present

## 2021-09-28 DIAGNOSIS — Y900 Blood alcohol level of less than 20 mg/100 ml: Secondary | ICD-10-CM | POA: Diagnosis not present

## 2021-09-28 DIAGNOSIS — R000 Tachycardia, unspecified: Secondary | ICD-10-CM | POA: Diagnosis not present

## 2021-09-28 DIAGNOSIS — Z794 Long term (current) use of insulin: Secondary | ICD-10-CM | POA: Insufficient documentation

## 2021-09-28 LAB — URINALYSIS, ROUTINE W REFLEX MICROSCOPIC
Bacteria, UA: NONE SEEN
Bilirubin Urine: NEGATIVE
Glucose, UA: >=500 mg/dL — AB
Hgb urine dipstick: NEGATIVE
Ketones, ur: NEGATIVE mg/dL
Leukocytes,Ua: NEGATIVE
Nitrite: NEGATIVE
Protein, ur: NEGATIVE mg/dL
Specific Gravity, Urine: 1.02 (ref 1.005–1.030)
pH: 7 (ref 5.0–8.0)

## 2021-09-28 LAB — COMPREHENSIVE METABOLIC PANEL
ALT: 19 U/L (ref 0–44)
AST: 20 U/L (ref 15–41)
Albumin: 4.2 g/dL (ref 3.5–5.0)
Alkaline Phosphatase: 124 U/L (ref 38–126)
Anion gap: 13 (ref 5–15)
BUN: 10 mg/dL (ref 6–20)
CO2: 25 mmol/L (ref 22–32)
Calcium: 9.3 mg/dL (ref 8.9–10.3)
Chloride: 96 mmol/L — ABNORMAL LOW (ref 98–111)
Creatinine, Ser: 0.84 mg/dL (ref 0.61–1.24)
GFR, Estimated: >60 mL/min (ref >60)
Glucose, Bld: 562 mg/dL (ref 70–99)
Potassium: 3.4 mmol/L — ABNORMAL LOW (ref 3.5–5.1)
Sodium: 134 mmol/L — ABNORMAL LOW (ref 135–145)
Total Bilirubin: 0.3 mg/dL (ref 0.3–1.2)
Total Protein: 8 g/dL (ref 6.5–8.1)

## 2021-09-28 LAB — RESP PANEL BY RT-PCR (FLU A&B, COVID) ARPGX2
Influenza A by PCR: NEGATIVE
Influenza B by PCR: NEGATIVE
SARS Coronavirus 2 by RT PCR: NEGATIVE

## 2021-09-28 LAB — CBC
HCT: 45.6 % (ref 39.0–52.0)
Hemoglobin: 14.9 g/dL (ref 13.0–17.0)
MCH: 29.8 pg (ref 26.0–34.0)
MCHC: 32.7 g/dL (ref 30.0–36.0)
MCV: 91.2 fL (ref 80.0–100.0)
Platelets: 271 10*3/uL (ref 150–400)
RBC: 5 MIL/uL (ref 4.22–5.81)
RDW: 12.2 % (ref 11.5–15.5)
WBC: 8.8 10*3/uL (ref 4.0–10.5)
nRBC: 0 % (ref 0.0–0.2)

## 2021-09-28 LAB — CBG MONITORING, ED
Glucose-Capillary: 559 mg/dL (ref 70–99)
Glucose-Capillary: 322 mg/dL — ABNORMAL HIGH (ref 70–99)

## 2021-09-28 LAB — SALICYLATE LEVEL: Salicylate Lvl: 10.6 mg/dL (ref 7.0–30.0)

## 2021-09-28 LAB — RAPID URINE DRUG SCREEN, HOSP PERFORMED
Amphetamines: NOT DETECTED
Barbiturates: NOT DETECTED
Benzodiazepines: NOT DETECTED
Cocaine: NOT DETECTED
Opiates: NOT DETECTED
Tetrahydrocannabinol: NOT DETECTED

## 2021-09-28 LAB — HEMOGLOBIN A1C
Hgb A1c MFr Bld: 9.6 % — ABNORMAL HIGH (ref 4.8–5.6)
Mean Plasma Glucose: 228.82 mg/dL

## 2021-09-28 LAB — ETHANOL: Alcohol, Ethyl (B): <10 mg/dL (ref <10)

## 2021-09-28 LAB — ACETAMINOPHEN LEVEL: Acetaminophen (Tylenol), Serum: <10 ug/mL — ABNORMAL LOW (ref 10–30)

## 2021-09-28 MED ORDER — INSULIN ASPART 100 UNIT/ML IJ SOLN
3.0000 [IU] | Freq: Once | INTRAMUSCULAR | Status: DC
  Filled 2021-09-28: qty 0.03

## 2021-09-28 MED ORDER — INSULIN ASPART 100 UNIT/ML IJ SOLN
0.0000 [IU] | Freq: Three times a day (TID) | INTRAMUSCULAR | Status: DC
  Administered 2021-09-29 (×2): 3 [IU] via SUBCUTANEOUS
  Administered 2021-09-29: 13:00:00 9 [IU] via SUBCUTANEOUS
  Administered 2021-09-30: 3 [IU] via SUBCUTANEOUS
  Filled 2021-09-28: qty 0.09

## 2021-09-28 MED ORDER — INSULIN ASPART 100 UNIT/ML IJ SOLN
4.0000 [IU] | Freq: Once | INTRAMUSCULAR | Status: DC
  Filled 2021-09-28: qty 0.04

## 2021-09-28 MED ORDER — INSULIN ASPART 100 UNIT/ML IJ SOLN
0.0000 [IU] | Freq: Every day | INTRAMUSCULAR | Status: DC
  Administered 2021-09-28: 4 [IU] via SUBCUTANEOUS
  Filled 2021-09-28: qty 0.05

## 2021-09-28 MED ORDER — OLANZAPINE 5 MG PO TBDP: 5.0000 mg | ORAL_TABLET | Freq: Three times a day (TID) | ORAL | Status: DC | PRN

## 2021-09-28 MED ORDER — DIAZEPAM 5 MG/ML IJ SOLN
5.0000 mg | Freq: Once | INTRAMUSCULAR | Status: AC
  Administered 2021-09-28: 5 mg via INTRAVENOUS
  Filled 2021-09-28: qty 2

## 2021-09-28 MED ORDER — ZIPRASIDONE MESYLATE 20 MG IM SOLR: 20.0000 mg | INTRAMUSCULAR | Status: DC | PRN

## 2021-09-28 MED ORDER — LORAZEPAM 1 MG PO TABS: 1.0000 mg | ORAL_TABLET | ORAL | Status: DC | PRN

## 2021-09-28 MED ORDER — POTASSIUM CHLORIDE CRYS ER 20 MEQ PO TBCR
40.0000 meq | EXTENDED_RELEASE_TABLET | Freq: Once | ORAL | Status: AC
  Administered 2021-09-28: 40 meq via ORAL
  Filled 2021-09-28: qty 2

## 2021-09-28 MED ORDER — LACTATED RINGERS IV BOLUS
1000.0000 mL | Freq: Once | INTRAVENOUS | Status: AC
  Administered 2021-09-28: 1000 mL via INTRAVENOUS

## 2021-09-28 NOTE — ED Triage Notes (Signed)
Patient from home. Patient to [facility] by [employer]. Patient IVCd.  ?

## 2021-09-28 NOTE — ED Provider Notes (Signed)
[facility]-EMERGENCY DEPT Provider Note   CSN: [id number] Arrival date & time: 09/28/21  1652     History  Chief Complaint  Patient presents with   Psychiatric Evaluation    IVC d    Willie Hughes is a 61 y.o. male.  HPI Patient presents from home under IVC.  IVC initiated by brother.  Per IVC report, patient has a history of schizoaffective disorder and has been off of his medications.  He has been living in unsanitary conditions which has led to his eviction.  He has exhibited paranoia and delusions.  The symptoms have been ongoing and worsening.  Patient's medical history includes HLD, bipolar disorder, arthritis, T2DM, hypothyroidism.  Patient, himself, endorses feeling ill physically.  He describes this as chronic conditions that are related to various autoimmune disorders.  He denies any acute physical symptoms.  He states that he has salt his mental illness and does endorse being off of psychiatric medications for several years.  He also states that he was recently evicted.  He is unaware of specific events that led to him coming to the ED under police custody with IVC paperwork filled out.    Home Medications Prior to Admission medications   Medication Sig Start Date End Date Taking? Authorizing Provider  aspirin 325 MG tablet Take 325 mg by mouth every 4 (four) hours.   Yes [provider]  b complex vitamins capsule Take 1 capsule by mouth daily.   Yes [provider]  docusate sodium (COLACE) 100 MG capsule Take 100 mg by mouth 2 (two) times daily.   Yes [provider]  famotidine (PEPCID) 20 MG tablet Take 1 tablet (20 mg total) by mouth 2 (two) times daily. 07/10/18  Yes Kuneff, Renee A, DO  fexofenadine (ALLEGRA) 180 MG tablet Take 180 mg by mouth daily.    Yes [provider]  guaiFENesin (MUCINEX) 600 MG 12 hr tablet Take 1,200 mg by mouth 2 (two) times daily.   Yes [provider]  Omega-3 Fatty Acids (EQL  FISH OIL PO) Take 1,000 mg by mouth 3 (three) times daily.    Yes [provider]  VITAMIN A PO Take 1 tablet by mouth daily.   Yes [provider]  vitamin B-12 (CYANOCOBALAMIN) 500 MCG tablet Take 500 mcg by mouth daily.   Yes [provider]  vitamin C (ASCORBIC ACID) 500 MG tablet Take 500 mg by mouth daily.   Yes [provider]  VITAMIN D PO Take 1 tablet by mouth daily.   Yes [provider]  VITAMIN K PO Take 1 tablet by mouth daily.   Yes [provider]  Fenofibrate 150 MG CAPS Take 1 capsule (150 mg total) by mouth daily. Patient not taking: Reported on 06/17/2019 03/08/18   Felix Pacini A, DO  glucose blood (ONE TOUCH TEST STRIPS) test strip Use as instructed to test blood sugar 3 times daily 07/31/18   Carlus Pavlov, MD  insulin aspart (NOVOLOG FLEXPEN) 100 UNIT/ML FlexPen Inject 15-18 Units into the skin 3 (three) times daily with meals. Patient not taking: Reported on 06/17/2019 09/03/18   Carlus Pavlov, MD  Insulin Degludec (TRESIBA FLEXTOUCH) 200 UNIT/ML SOPN Inject 30 Units into the skin 2 (two) times daily. Patient not taking: Reported on 09/29/2021 06/21/19   Hughie Closs, MD  Insulin Syringe-Needle U-100 (RELION INSULIN SYRINGE 1ML/31G) 31G X 5/16" 1 ML MISC Use with insulin twice daily as directed. 07/11/17   Doreene Nest,  NP  lithium carbonate 300 MG capsule Take 3 capsules (900 mg total) by mouth daily. Patient not taking: Reported on 06/17/2019 10/23/18   Oletta Darter, MD  [facility] LANCETS 33G MISC Use as instructed to test blood sugar 3 times daily 07/31/18   Carlus Pavlov, MD  simvastatin (ZOCOR) 40 MG tablet Take 1 tablet (40 mg total) by mouth at bedtime. Patient not taking: Reported on 06/17/2019 03/08/18   Felix Pacini A, DO  tamsulosin (FLOMAX) 0.4 MG CAPS capsule Take 1 capsule (0.4 mg total) by mouth daily. Patient not taking: Reported on 09/29/2021 02/26/20   Palumbo, April, MD       Allergies    Acetaminophen, Advil [ibuprofen], Kava kava, and Aluminum    Review of Systems   Review of Systems  Constitutional:  Positive for fatigue.  All other systems reviewed and are negative.  Physical Exam Updated Vital Signs BP (!) 176/101    Pulse 100    Temp 98.2 F (36.8 C)    Resp 16    SpO2 99%  Physical Exam Vitals and nursing note reviewed.  Constitutional:      General: He is not in acute distress.    Appearance: Normal appearance. He is well-developed. He is not ill-appearing, toxic-appearing or diaphoretic.  HENT:     Head: Normocephalic and atraumatic.     Right Ear: External ear normal.     Left Ear: External ear normal.     Nose: Nose normal.  Eyes:     General: No scleral icterus.    Extraocular Movements: Extraocular movements intact.     Conjunctiva/sclera: Conjunctivae normal.  Cardiovascular:     Rate and Rhythm: Regular rhythm. Tachycardia present.  Pulmonary:     Effort: Pulmonary effort is normal. No respiratory distress.  Abdominal:     General: Abdomen is flat. There is no distension.  Musculoskeletal:        General: No swelling or deformity. Normal range of motion.     Cervical back: Normal range of motion. No rigidity.  Skin:    General: Skin is warm and dry.     Coloration: Skin is not jaundiced or pale.  Neurological:     General: No focal deficit present.     Mental Status: He is alert and oriented to person, place, and time.     Cranial Nerves: No cranial nerve deficit.     Coordination: Coordination normal.  Psychiatric:        Speech: Speech is tangential. Speech is not delayed or slurred.        Behavior: Behavior is uncooperative. Behavior is not slowed or withdrawn.        Thought Content: Thought content is paranoid and delusional. Thought content does not include homicidal or suicidal ideation.    ED Results / Procedures / Treatments   Labs (all labs ordered are listed, but only abnormal results are displayed) Labs  Reviewed  COMPREHENSIVE METABOLIC PANEL - Abnormal; Notable for the following components:      Result Value   Sodium 134 (*)    Potassium 3.4 (*)    Chloride 96 (*)    Glucose, Bld 562 (*)    All other components within normal limits  ACETAMINOPHEN LEVEL - Abnormal; Notable for the following components:   Acetaminophen (Tylenol), Serum <10 (*)    All other components within normal limits  URINALYSIS, ROUTINE W REFLEX MICROSCOPIC - Abnormal; Notable for the following components:   Color, Urine STRAW (*)  Glucose, UA >=500 (*)    All other components within normal limits  HEMOGLOBIN A1C - Abnormal; Notable for the following components:   Hgb A1c MFr Bld 9.6 (*)    All other components within normal limits  CBG MONITORING, ED - Abnormal; Notable for the following components:   Glucose-Capillary 559 (*)    All other components within normal limits  CBG MONITORING, ED - Abnormal; Notable for the following components:   Glucose-Capillary 322 (*)    All other components within normal limits  CBG MONITORING, ED - Abnormal; Notable for the following components:   Glucose-Capillary 237 (*)    All other components within normal limits  CBG MONITORING, ED - Abnormal; Notable for the following components:   Glucose-Capillary 388 (*)    All other components within normal limits  CBG MONITORING, ED - Abnormal; Notable for the following components:   Glucose-Capillary 206 (*)    All other components within normal limits  CBG MONITORING, ED - Abnormal; Notable for the following components:   Glucose-Capillary 155 (*)    All other components within normal limits  CBG MONITORING, ED - Abnormal; Notable for the following components:   Glucose-Capillary 213 (*)    All other components within normal limits  RESP PANEL BY RT-PCR (FLU A&B, COVID) ARPGX2  ETHANOL  SALICYLATE LEVEL  CBC  RAPID URINE DRUG SCREEN, HOSP PERFORMED    EKG None  Radiology No results  found.  Procedures Procedures    Medications Ordered in ED Medications  insulin aspart (novoLOG) injection 0-9 Units (3 Units Subcutaneous Given 09/30/21 0938)  insulin aspart (novoLOG) injection 0-5 Units (0 Units Subcutaneous Not Given 09/29/21 2201)  OLANZapine zydis (ZYPREXA) disintegrating tablet 5 mg (has no administration in time range)    And  LORazepam (ATIVAN) tablet 1 mg (has no administration in time range)    And  ziprasidone (GEODON) injection 20 mg (has no administration in time range)  insulin glargine-yfgn (SEMGLEE) injection 12 Units (12 Units Subcutaneous Given 09/30/21 0936)  insulin aspart (novoLOG) injection 5 Units (5 Units Subcutaneous Given 09/30/21 0939)  lactated ringers bolus 1,000 mL (0 mLs Intravenous Stopped 09/28/21 2206)  diazepam (VALIUM) injection 5 mg (5 mg Intravenous Given 09/28/21 2143)  potassium chloride SA (KLOR-CON M) CR tablet 40 mEq (40 mEq Oral Given 09/28/21 2327)    ED Course/ Medical Decision Making/ A&P                           Medical Decision Making Amount and/or Complexity of Data Reviewed Labs: ordered.  Risk Prescription drug management.   This patient presents to the ED for concern of paranoia, delusions, failure to thrive, this involves an extensive number of treatment options, and is a complaint that carries with it a high risk of complications and morbidity.  The differential diagnosis includes decompensated psychiatric illness, dementia, illicit drug use, alcoholism, polypharmacy, medication withdrawal, underlying infection, underlying metabolic abnormalities   Co morbidities that complicate the patient evaluation  Hypothyroidism, T2DM, arthritis, bipolar disorder, HLD   Additional history obtained:  Additional history obtained from N/A External records from outside source obtained and reviewed including EMR and IVC paperwork   Lab Tests:  I Ordered, and personally interpreted labs.  The pertinent results include:  Hyperglycemia without evidence of DKA or HHS  Medicines ordered and prescription drug management:  I ordered medication including IV fluids and insulin for hyperglycemia; Valium for agitation Reevaluation of the patient after  these medicines showed that the patient resolved I have reviewed the patients home medicines and have made adjustments as needed   Consultations Obtained:  I requested consultation with the TTS,  and discussed lab and imaging findings as well as pertinent plan - they recommend: Recommendations pending   Problem List / ED Course:  Patient arrives under IVC, completed by his brother.  His family has noticed worsening paranoid delusions.  He has also been living in unsanitary conditions that led to his eviction.  Vital signs on arrival notable for mild tachycardia.  Physically, he endorses chronic fatigue.  He denies any acute physical complaints.  He does confirm that he has been off her medications for several years.  On my assessment, patient displays delusions and thoughts of grandiosity.  He appears to have poor insight into his condition.  Medical clearance work-up was initiated.  Laboratory work-up was notable for hyperglycemia.  There does not appear to be any evidence of DKA or HHS.  Patient was given bolus of IV fluids with subsequent improvement in his blood sugar.  Correction dose of insulin was ordered.  SSI insulin dosing was ordered for his stay here in the emergency department.  Given that he is not on any medications currently, no home medications to order.  Patient remained mildly agitated since his arrival.  Valium was given with resolution of this agitation.  TTS was consulted.  Patient to remain in the ED under IVC pending TTS evaluation.    Social Determinants of Health:  History of mental illness     Patient arrives under IVC, completed by his brother.  His family has noticed worsening paranoid delusions.  He has also been living in unsanitary  conditions that led to his eviction.  On my assessment, patient displays delusions and thoughts of grandiosity.  He appears to have poor insight into his condition.  Given the concerns of family, patient does currently represent a third of harm to himself.        Final Clinical Impression(s) / ED Diagnoses Final diagnoses:  Delusions [facility])    Rx / DC Orders ED Discharge Orders     None         Gloris Manchester, MD 09/30/21 1213

## 2021-09-28 NOTE — ED Notes (Signed)
Patient was speaking to me about leaving tomorrow. I shared with him that he was involuntarily committed. He stated that he needed to see the doctor right away and he was going to get in touch with his attorney. Notified Dr. Doren Custard and charge RN. Will continue to monitor.  ?

## 2021-09-29 ENCOUNTER — Other Ambulatory Visit: Payer: Self-pay

## 2021-09-29 LAB — CBG MONITORING, ED
Glucose-Capillary: 237 mg/dL — ABNORMAL HIGH (ref 70–99)
Glucose-Capillary: 388 mg/dL — ABNORMAL HIGH (ref 70–99)
Glucose-Capillary: 206 mg/dL — ABNORMAL HIGH (ref 70–99)
Glucose-Capillary: 155 mg/dL — ABNORMAL HIGH (ref 70–99)

## 2021-09-29 MED ORDER — INSULIN GLARGINE-YFGN 100 UNIT/ML ~~LOC~~ SOLN
12.0000 [IU] | Freq: Every day | SUBCUTANEOUS | Status: DC
  Administered 2021-09-29 – 2021-09-30 (×2): 12 [IU] via SUBCUTANEOUS
  Filled 2021-09-29 (×2): qty 0.12

## 2021-09-29 MED ORDER — INSULIN ASPART 100 UNIT/ML IJ SOLN
5.0000 [IU] | Freq: Three times a day (TID) | INTRAMUSCULAR | Status: DC
  Administered 2021-09-29 – 2021-09-30 (×2): 5 [IU] via SUBCUTANEOUS
  Filled 2021-09-29: qty 0.05

## 2021-09-29 NOTE — ED Provider Notes (Signed)
Emergency Medicine Observation Re-evaluation Note ? ?Willie Hughes is a 61 y.o. male, seen on rounds today.  Pt initially presented to the ED for complaints of Psychiatric Evaluation (IVC d) ?Currently, the patient is angry that he has been IVC'd.  He said he was going to sue everyone.  Per notes, pt has a hx of schizoaffective d/o and has stopped taking his meds.  He was living in filth and has been very paranoid. ? ?Physical Exam  ?BP (!) 150/83 (BP Location: Left Arm)   Pulse 68   Temp 97.9 ?F (36.6 ?C) (Oral)   Resp 18   SpO2 96%  ?Physical Exam ?General: awake and alert ?Cardiac: rrr ?Lungs: cta b ?Psych: angry ? ?ED Course / MDM  ?EKG:  ? ?I have reviewed the labs performed to date as well as medications administered while in observation.  Recent changes in the last 24 hours include BS elevated.  Pt not in DKA.  He was started on sliding scale insulin.  I have consulted the diabetic coordinator. ? ?Plan  ?Current plan is for inpatient placement. ?Willie Hughes is under involuntary commitment. ?  ? ?  ?Jacalyn Lefevre, MD ?09/29/21 [phone] ? ?

## 2021-09-29 NOTE — ED Notes (Signed)
Patient brother Willie Hughes has called.  Would like to speak to provider.  # [phone] ?

## 2021-09-29 NOTE — ED Provider Notes (Signed)
Diabetes nurse has seen the patient and is recommending that he he be started on some Zoely 12 units daily and NovoLog 5 units 3 times daily with meals if eating greater than 50% of his food.  I have entered these orders ?  ?Mancel Bale, MD ?09/29/21 1631 ? ?
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
## 2021-09-29 NOTE — Progress Notes (Addendum)
Inpatient Diabetes Program Recommendations ? ?AACE/ADA: New Consensus Statement on Inpatient Glycemic Control (2015) ? ?Target Ranges:  Prepandial:   less than 140 mg/dL ?     Peak postprandial:   less than 180 mg/dL (1-2 hours) ?     Critically ill patients:  140 - 180 mg/dL  ? ?Lab Results  ?Component Value Date  ? GLUCAP 237 (H) 09/29/2021  ? HGBA1C 9.6 (H) 09/28/2021  ? ? ?Review of Glycemic Control ? Latest Reference Range & Units 09/28/21 17:58 09/28/21 22:04 09/29/21 08:55  ?Glucose-Capillary 70 - 99 mg/dL 559 (HH) 741 (H) 638 (H)  ?[facility]): Data is critically high ?(H): Data is abnormally high ? ?Diabetes history: DM2 ?Outpatient Diabetes medications: Tresiba 12-16 units BID, Novolog 14-18 units TID ?Current orders for Inpatient glycemic control: Novolog 0-9 units TID and 0-5 units QHS ? ?Received consult for this patient.  Currently under IVC.  Spoke with him at bedside; asked him about his DM regimen.  He states he takes the above home medications inconsistently.  He has food insecurity and administers his insulin based on what and when he eats.  He only checks his blood sugar when needed.  Asked when needed is and he states if he feels low or high.  He has not seen a PCP since last may and has not had a prescription for insulin since then either.  He has been rationing his insulin as well.  During or conversation he veers of to talking about suing his family and the healthcare system.  Redirected several times successfully.  He states he can afford his insulin and needs a prescription.  Explained importance of having a PCP for this reason.  He is aware he can get over the counter [employer] insulin but states its more expensive than what insurance can cover.   Reviewed patient's current A1c of 9.6%. Explained what a A1c is and what it measures. Also reviewed goal A1c with patient, importance of good glucose control @ home, and blood sugar goals. ? ?May need to add some basal insulin to his inpatient regimen at  some point.  Will follow and make recommendations.  237 mg/dL this am.  Will check back at lunch time.   ? ?Addendum@1618 : ? ? Latest Reference Range & Units 09/29/21 08:55 09/29/21 12:35  ?Glucose-Capillary 70 - 99 mg/dL 453 (H) 646 (H)  ?(H): Data is abnormally high ? ?Please consider adding: ? ?Novolog 5 units TID with meals if eats at least 50% ?Semglee 12 units QD ? ?Will continue to follow while inpatient. ? ?Thank you, ?Dulce Sellar, MSN, RN ?Diabetes Coordinator ?Inpatient Diabetes Program ?[phone] (team pager from 8a-5p) ? ? ? ? ? ? ?

## 2021-09-29 NOTE — ED Notes (Signed)
Patient alert this shift. Patient medication compliant. Patient can ambulate. Patient irritable at times, limited insight to current situation and hospitalization. Patient can complete ADLs.  ?

## 2021-09-30 LAB — CBG MONITORING, ED: Glucose-Capillary: 213 mg/dL — ABNORMAL HIGH (ref 70–99)

## 2021-09-30 NOTE — ED Notes (Signed)
Report given to [facility].  

## 2021-09-30 NOTE — ED Notes (Addendum)
Spoke with Willie Hughes intake coordinator with [name]. Patient will need COVID test with results faxed to [phone] .  Patient may arrive anytime after 9 am and report should be called to [phone].  Patient  will will be admitted to [facility], but should arrive at the [facility] [place] building initially upon arrival.  Accepting physician Enzo Bi, MD. ?

## 2021-09-30 NOTE — ED Notes (Signed)
Refused EMTALA signature  ?

## 2021-09-30 NOTE — ED Provider Notes (Signed)
Emergency Medicine Observation Re-evaluation Note ? ?Willie Hughes is a 61 y.o. male, seen on rounds today.  Pt initially presented to the ED for complaints of Psychiatric Evaluation (IVC d) ?Currently, the patient is sleeping. ? ?Physical Exam  ?BP 110/64 (BP Location: Left Arm)   Pulse 66   Temp (!) 97.4 ?F (36.3 ?C) (Oral)   Resp 16   SpO2 95%  ?Physical Exam ?General: nad ?Lungs: symmetric chest rise, normal rate ?Psych: resting comfortably ? ?ED Course / MDM  ?EKG:  ? ?I have reviewed the labs performed to date as well as medications administered while in observation.  Recent changes in the last 24 hours include he was cleared for TTS evaluation today and was recommended for inpatient therapy unfortunately there was some concern about his hyperglycemia.  Patient's blood sugars have been well controlled overnight and plan is for him to be transferred this morning.. ? ?Plan  ?Current plan is for psychiatric admission plan for transfer this morning at 9 AM.  Per nursing no acute events overnight.Marland Kitchen ?Willie Hughes is under involuntary commitment. ?  ? ?  ?Melene Plan, DO ?09/30/21 0725 ? ?

## 2021-09-30 NOTE — ED Notes (Signed)
Patient agitated at bedside when explaining transport . States that he is unsure of what   ?

## 2021-09-30 NOTE — ED Notes (Signed)
Pt given breakfast.

## 2021-09-30 NOTE — ED Notes (Signed)
Sheriff has been contacted. Pt set to be transported around 1015. ?

## 2021-09-30 NOTE — ED Notes (Signed)
Pt is resting in bed no signs of distress.  ?

## 2021-09-30 NOTE — Progress Notes (Signed)
[facility] Inpatient Behavioral Placement  ? ?Pt meets inpatient criteria per Sindy Guadeloupe, NP. There are no appropriate beds at [facility].   Referral was sent to the following facilities;  ? ?Destination ?Service Provider Address Phone Fax  ?[facility]  [address], [place] [address] [phone]  ?[facility]  [address] [place] [address] [phone]  ?[facility]  [address], [place] [address] [phone]  ?[facility]  [address] [place] [address] [phone]  ?[facility]  [address] [place] [address] [phone]  ?[facility]  [address] [place] [address] [phone]  ?[facility]  [address] [place]., [place] [address] [phone]  ?[facility]  [address], [place] [address] [phone]  ?[facility]  [address], [place] [address] [phone]  ?[facility]  [address] [place], [place] [address] [phone]  ?[facility]  [address] [place] [address] [phone]  ?[facility]  [address], [place] [address] [phone]  ?[facility]  [address] [place] [address] [place], [place] [address]    ? ? ?Situation ongoing,  CSW will follow up. ? ? ?Maryjean Ka, MSW, LCSWA ?09/30/2021  @ 12:08 AM ? ?

## 2021-11-11 ENCOUNTER — Other Ambulatory Visit: Payer: Self-pay

## 2021-11-11 ENCOUNTER — Emergency Department (HOSPITAL_COMMUNITY): Payer: Medicare Other

## 2021-11-11 ENCOUNTER — Emergency Department (HOSPITAL_COMMUNITY)
Admission: EM | Admit: 2021-11-11 | Discharge: 2021-11-11 | Disposition: A | Discharge status: Home / Self Care | Payer: Medicare Other | Attending: Emergency Medicine | Admitting: Emergency Medicine

## 2021-11-11 DIAGNOSIS — Z794 Long term (current) use of insulin: Secondary | ICD-10-CM | POA: Insufficient documentation

## 2021-11-11 DIAGNOSIS — R519 Headache, unspecified: Secondary | ICD-10-CM | POA: Insufficient documentation

## 2021-11-11 DIAGNOSIS — R531 Weakness: Secondary | ICD-10-CM | POA: Diagnosis present

## 2021-11-11 DIAGNOSIS — Z7982 Long term (current) use of aspirin: Secondary | ICD-10-CM | POA: Diagnosis not present

## 2021-11-11 DIAGNOSIS — E119 Type 2 diabetes mellitus without complications: Secondary | ICD-10-CM | POA: Insufficient documentation

## 2021-11-11 DIAGNOSIS — Z8616 Personal history of COVID-19: Secondary | ICD-10-CM | POA: Diagnosis not present

## 2021-11-11 LAB — CBC WITH DIFFERENTIAL/PLATELET
Abs Immature Granulocytes: 0.02 10*3/uL (ref 0.00–0.07)
Basophils Absolute: 0.1 10*3/uL (ref 0.0–0.1)
Basophils Relative: 1 %
Eosinophils Absolute: 0.5 10*3/uL (ref 0.0–0.5)
Eosinophils Relative: 9 %
HCT: 44.3 % (ref 39.0–52.0)
Hemoglobin: 14 g/dL (ref 13.0–17.0)
Immature Granulocytes: 0 %
Lymphocytes Relative: 33 %
Lymphs Abs: 1.9 10*3/uL (ref 0.7–4.0)
MCH: 29.7 pg (ref 26.0–34.0)
MCHC: 31.6 g/dL (ref 30.0–36.0)
MCV: 93.9 fL (ref 80.0–100.0)
Monocytes Absolute: 0.6 10*3/uL (ref 0.1–1.0)
Monocytes Relative: 10 %
Neutro Abs: 2.7 10*3/uL (ref 1.7–7.7)
Neutrophils Relative %: 47 %
Platelets: 278 10*3/uL (ref 150–400)
RBC: 4.72 MIL/uL (ref 4.22–5.81)
RDW: 12.4 % (ref 11.5–15.5)
WBC: 5.7 10*3/uL (ref 4.0–10.5)
nRBC: 0 % (ref 0.0–0.2)

## 2021-11-11 LAB — ETHANOL: Alcohol, Ethyl (B): <10 mg/dL (ref <10)

## 2021-11-11 LAB — BASIC METABOLIC PANEL
Anion gap: 8 (ref 5–15)
BUN: 9 mg/dL (ref 6–20)
CO2: 25 mmol/L (ref 22–32)
Calcium: 9.2 mg/dL (ref 8.9–10.3)
Chloride: 105 mmol/L (ref 98–111)
Creatinine, Ser: 0.85 mg/dL (ref 0.61–1.24)
GFR, Estimated: >60 mL/min (ref >60)
Glucose, Bld: 282 mg/dL — ABNORMAL HIGH (ref 70–99)
Potassium: 3.8 mmol/L (ref 3.5–5.1)
Sodium: 138 mmol/L (ref 135–145)

## 2021-11-11 LAB — CBG MONITORING, ED: Glucose-Capillary: 277 mg/dL — ABNORMAL HIGH (ref 70–99)

## 2021-11-11 MED ORDER — PROCHLORPERAZINE EDISYLATE 10 MG/2ML IJ SOLN
5.0000 mg | Freq: Once | INTRAMUSCULAR | Status: AC
  Administered 2021-11-11: 5 mg via INTRAVENOUS
  Filled 2021-11-11: qty 2

## 2021-11-11 MED ORDER — SODIUM CHLORIDE 0.9 % IV BOLUS
1000.0000 mL | Freq: Once | INTRAVENOUS | Status: AC
Start: 2021-11-11 — End: 2021-11-11
  Administered 2021-11-11: 1000 mL via INTRAVENOUS

## 2021-11-11 NOTE — ED Provider Notes (Signed)
?[facility] EMERGENCY DEPARTMENT ?Provider Note ? ? ?CSN: [id number] ?Arrival date & time: 11/11/21  1100 ? ?  ? ?History ? ?Chief Complaint  ?Patient presents with  ? Headache  ? Weakness  ? ? ?Willie Hughes is a 61 y.o. male. ? ?Patient here with generalized weakness, headache.  States that he feels like he is having a flare of his long COVID.  Has self diagnosed himself with MS, Parkinson's, long COVID.  History of diabetes.  History of mental illness as well.  Denies any suicidal homicidal ideation.  States that he is being evicted from his house that is making him more fatigued.  Denies any weakness, numbness, speech changes or vision changes.  Denies any chest pain or shortness of breath.  Nothing has made it better or worse. ? ? ?Headache ?Associated symptoms: weakness   ?Weakness ?Associated symptoms: headaches   ? ?  ? ?Home Medications ?Prior to Admission medications   ?Medication Sig Start Date End Date Taking? Authorizing Provider  ?aspirin 325 MG tablet Take 325 mg by mouth every 4 (four) hours.    [provider]  ?b complex vitamins capsule Take 1 capsule by mouth daily.    [provider]  ?docusate sodium (COLACE) 100 MG capsule Take 100 mg by mouth 2 (two) times daily.    [provider]  ?famotidine (PEPCID) 20 MG tablet Take 1 tablet (20 mg total) by mouth 2 (two) times daily. 07/10/18   Felix Pacini A, DO  ?Fenofibrate 150 MG CAPS Take 1 capsule (150 mg total) by mouth daily. ?Patient not taking: Reported on 06/17/2019 03/08/18   Felix Pacini A, DO  ?fexofenadine (ALLEGRA) 180 MG tablet Take 180 mg by mouth daily.     [provider]  ?glucose blood (ONE TOUCH TEST STRIPS) test strip Use as instructed to test blood sugar 3 times daily 07/31/18   Carlus Pavlov, MD  ?guaiFENesin (MUCINEX) 600 MG 12 hr tablet Take 1,200 mg by mouth 2 (two) times daily.    [provider]  ?insulin aspart (NOVOLOG FLEXPEN) 100 UNIT/ML FlexPen Inject 15-18  Units into the skin 3 (three) times daily with meals. ?Patient not taking: Reported on 06/17/2019 09/03/18   Carlus Pavlov, MD  ?Insulin Degludec (TRESIBA FLEXTOUCH) 200 UNIT/ML SOPN Inject 30 Units into the skin 2 (two) times daily. ?Patient not taking: Reported on 09/29/2021 06/21/19   Hughie Closs, MD  ?Insulin Syringe-Needle U-100 (RELION INSULIN SYRINGE 1ML/31G) 31G X 5/16" 1 ML MISC Use with insulin twice daily as directed. 07/11/17   Doreene Nest, NP  ?lithium carbonate 300 MG capsule Take 3 capsules (900 mg total) by mouth daily. ?Patient not taking: Reported on 06/17/2019 10/23/18   Oletta Darter, MD  ?Omega-3 Fatty Acids (EQL FISH OIL PO) Take 1,000 mg by mouth 3 (three) times daily.     [provider]  ?Dola Argyle LANCETS 33G MISC Use as instructed to test blood sugar 3 times daily 07/31/18   Carlus Pavlov, MD  ?simvastatin (ZOCOR) 40 MG tablet Take 1 tablet (40 mg total) by mouth at bedtime. ?Patient not taking: Reported on 06/17/2019 03/08/18   Felix Pacini A, DO  ?tamsulosin (FLOMAX) 0.4 MG CAPS capsule Take 1 capsule (0.4 mg total) by mouth daily. ?Patient not taking: Reported on 09/29/2021 02/26/20   Palumbo, April, MD  ?VITAMIN A PO Take 1 tablet by mouth daily.    [provider]  ?vitamin B-12 (CYANOCOBALAMIN) 500 MCG tablet Take 500 mcg by  mouth daily.    [provider]  ?vitamin C (ASCORBIC ACID) 500 MG tablet Take 500 mg by mouth daily.    [provider]  ?VITAMIN D PO Take 1 tablet by mouth daily.    [provider]  ?VITAMIN K PO Take 1 tablet by mouth daily.    [provider]  ?   ? ?Allergies    ?Acetaminophen, Advil [ibuprofen], Kava kava, and Aluminum   ? ?Review of Systems   ?Review of Systems  ?Neurological:  Positive for weakness and headaches.  ? ?Physical Exam ?Updated Vital Signs ?BP (!) 152/97   Pulse 70   Temp 98 ?F (36.7 ?C) (Oral)   Resp 11   Ht 6\' 4"  (1.93 m)   Wt 90.7 kg   SpO2 97%   BMI 24.34 kg/m?   ?Physical Exam ?Vitals and nursing note reviewed.  ?Constitutional:   ?   General: He is not in acute distress. ?   Appearance: He is well-developed.  ?HENT:  ?   Head: Normocephalic and atraumatic.  ?   Mouth/Throat:  ?   Mouth: Mucous membranes are moist.  ?   Pharynx: Oropharynx is clear.  ?Eyes:  ?   General: No visual field deficit. ?   Extraocular Movements: Extraocular movements intact.  ?   Right eye: No nystagmus.  ?   Left eye: No nystagmus.  ?   Conjunctiva/sclera: Conjunctivae normal.  ?   Pupils: Pupils are equal, round, and reactive to light.  ?Cardiovascular:  ?   Rate and Rhythm: Normal rate and regular rhythm.  ?   Heart sounds: No murmur heard. ?Pulmonary:  ?   Effort: Pulmonary effort is normal. No respiratory distress.  ?   Breath sounds: Normal breath sounds.  ?Abdominal:  ?   Palpations: Abdomen is soft.  ?   Tenderness: There is no abdominal tenderness.  ?Musculoskeletal:     ?   General: No swelling.  ?   Cervical back: Normal range of motion and neck supple.  ?Skin: ?   General: Skin is warm and dry.  ?   Capillary Refill: Capillary refill takes less than 2 seconds.  ?Neurological:  ?   Mental Status: He is alert and oriented to person, place, and time.  ?   Cranial Nerves: No cranial nerve deficit, dysarthria or facial asymmetry.  ?   Sensory: No sensory deficit.  ?   Motor: No weakness.  ?   Coordination: Coordination normal.  ?   Gait: Gait normal.  ?Psychiatric:     ?   Mood and Affect: Mood normal. Mood is not anxious.  ? ? ?ED Results / Procedures / Treatments   ?Labs ?(all labs ordered are listed, but only abnormal results are displayed) ?Labs Reviewed  ?BASIC METABOLIC PANEL - Abnormal; Notable for the following components:  ?    Result Value  ? Glucose, Bld 282 (*)   ? All other components within normal limits  ?CBG MONITORING, ED - Abnormal; Notable for the following components:  ? Glucose-Capillary 277 (*)   ? All other components within normal limits  ?CBC WITH  DIFFERENTIAL/PLATELET  ?ETHANOL  ? ? ?EKG ?EKG Interpretation ? ?Date/Time:  Friday November 11 2021 11:02:54 EDT ?Ventricular Rate:  75 ?PR Interval:  173 ?QRS Duration: 151 ?QT Interval:  417 ?QTC Calculation: 466 ?R Axis:   35 ?Text Interpretation: Sinus rhythm Right bundle branch block Confirmed by [id number], Oziah Vitanza ([id number]) on 11/11/2021 11:04:25 AM ? ?Radiology ?CT  Head Wo Contrast ? ?Result Date: 11/11/2021 ?CLINICAL DATA:  Headache. EXAM: CT HEAD WITHOUT CONTRAST TECHNIQUE: Contiguous axial images were obtained from the base of the skull through the vertex without intravenous contrast. RADIATION DOSE REDUCTION: This exam was performed according to the departmental dose-optimization program which includes automated exposure control, adjustment of the mA and/or kV according to patient size and/or use of iterative reconstruction technique. COMPARISON:  None. FINDINGS: Brain: No evidence of acute infarction, hemorrhage, hydrocephalus, extra-axial collection or mass lesion/mass effect. Small remote right thalamic lacunar infarct. Vascular: No hyperdense vessel identified. Calcific intracranial atherosclerosis Skull: No evidence of acute intracranial abnormality. Sinuses/Orbits: Right maxillary sinus retention cyst. Other: No mastoid effusion. IMPRESSION: No evidence of acute intracranial abnormality. Electronically Signed   By: Feliberto HartsFrederick S Jones M.D.   On: 11/11/2021 12:34   ? ?Procedures ?Procedures  ? ? ?Medications Ordered in ED ?Medications  ?sodium chloride 0.9 % bolus 1,000 mL (1,000 mLs Intravenous New Bag/Given 11/11/21 1125)  ?prochlorperazine (COMPAZINE) injection 5 mg (5 mg Intravenous Given 11/11/21 1126)  ? ? ?ED Course/ Medical Decision Making/ A&P ?  ?                        ?Medical Decision Making ?Amount and/or Complexity of Data Reviewed ?Labs: ordered. ?Radiology: ordered. ? ?Risk ?Prescription drug management. ? ? ?Willie Hughes is here with ongoing headache and generalized weakness.  Seen recently for the  same unremarkable work-up but left without being seen.  History of mental illness, he states he has a history of long COVID.  History of diabetes.  Patient overall with normal vitals.  Normal neurological exam.  Have no concer

## 2021-11-11 NOTE — ED Triage Notes (Addendum)
Pt BIB EMS from home c/o intermittent headache, generalized weakness, "feels like something neurologically going on". Was at [facility] few days ago for same, sat in the waiting room but did not get evaluated. H/O MS, Parkinson, DM, psych history. ? ?150/90 ?HR 72 ?SPO2 98% RA ?CBG 313 ?

## 2021-11-18 NOTE — Progress Notes (Signed)
To help figure out medical workup ?
